# Patient Record
Sex: Female | Born: 1990 | Race: Black or African American | Hispanic: No | Marital: Single | Smoking: Never smoker
Health system: Southern US, Community
[De-identification: ages and names within clinical notes are randomized; demographics above are authoritative.]

## PROBLEM LIST (undated history)

## (undated) DIAGNOSIS — I499 Cardiac arrhythmia, unspecified: Secondary | ICD-10-CM

## (undated) DIAGNOSIS — F419 Anxiety disorder, unspecified: Secondary | ICD-10-CM

## (undated) DIAGNOSIS — F32A Depression, unspecified: Secondary | ICD-10-CM

## (undated) DIAGNOSIS — R002 Palpitations: Secondary | ICD-10-CM

## (undated) DIAGNOSIS — R011 Cardiac murmur, unspecified: Secondary | ICD-10-CM

## (undated) HISTORY — DX: Palpitations: R00.2

## (undated) HISTORY — PX: WRIST FRACTURE SURGERY: SHX121

---

## 2013-04-17 HISTORY — PX: FOOT FRACTURE SURGERY: SHX645

## 2015-09-25 ENCOUNTER — Encounter (HOSPITAL_COMMUNITY): Payer: Self-pay

## 2015-09-25 ENCOUNTER — Emergency Department (HOSPITAL_COMMUNITY)
Admission: EM | Admit: 2015-09-25 | Discharge: 2015-09-26 | Disposition: A | Discharge status: Home / Self Care | Payer: Self-pay | Attending: Emergency Medicine | Admitting: Emergency Medicine

## 2015-09-25 DIAGNOSIS — Y9389 Activity, other specified: Secondary | ICD-10-CM | POA: Insufficient documentation

## 2015-09-25 DIAGNOSIS — W228XXA Striking against or struck by other objects, initial encounter: Secondary | ICD-10-CM | POA: Insufficient documentation

## 2015-09-25 DIAGNOSIS — Y990 Civilian activity done for income or pay: Secondary | ICD-10-CM | POA: Insufficient documentation

## 2015-09-25 DIAGNOSIS — S0990XA Unspecified injury of head, initial encounter: Secondary | ICD-10-CM | POA: Insufficient documentation

## 2015-09-25 DIAGNOSIS — Y929 Unspecified place or not applicable: Secondary | ICD-10-CM | POA: Insufficient documentation

## 2015-09-25 NOTE — ED Provider Notes (Signed)
History  By signing my name below, I, Earmon Phoenix, attest that this documentation has been prepared under the direction and in the presence of Melburn Hake, [place]. Electronically Signed: Earmon Phoenix, ED Scribe. 09/25/2015. 12:06 AM.  Chief Complaint  Patient presents with  . Head Injury   The history is provided by the patient and medical records. No language interpreter was used.    HPI Comments:  Brenda Mcmahon is a 25 y.o. female who presents to the Emergency Department complaining of a head injury that occurred four days ago. She states she bent over and was startled, causing her to jump up and hit her forehead on a tile/laminate counter. She reports associated dizziness at the time of the injury. She reports some associated intermittent blurred vision, constant, worsening, right-sided HA, intermittent dizziness, nausea and light-headedness. She states she ran into a wall twice this evening due to the dizziness and HA. She states her dizziness and HA gradual worsened over the past few days. Pt reports taking Excedrin after the incident but has not taken anything since for the pain. Light and moving too fast increases her head pain. She denies alleviating factors. She denies LOC, abdominal pain, vomiting, numbness, tingling or weakness of any extremity, CP, SOB, neck stiffness. She denies any new injury or trauma to the head.    History reviewed. No pertinent past medical history. History reviewed. No pertinent past surgical history. No family history on file. Social History  Substance Use Topics  . Smoking status: Never Smoker   . Smokeless tobacco: None  . Alcohol Use: No   OB History    No data available     Review of Systems  Eyes: Positive for photophobia and visual disturbance.  Respiratory: Negative for shortness of breath.   Cardiovascular: Negative for chest pain.  Gastrointestinal: Positive for nausea. Negative for vomiting and abdominal pain.  Skin:  Negative for color change and wound.  Neurological: Positive for dizziness, light-headedness and headaches. Negative for syncope, weakness and numbness.    Allergies  Review of patient's allergies indicates not on file.  Home Medications   Prior to Admission medications   Not on File   Triage Vitals: BP 118/54 mmHg  Pulse 78  Temp(Src) 98.2 F (36.8 C) (Oral)  Resp 12  Ht  (1.575 m)  Wt 130 lb (58.968 kg)  BMI 23.77 kg/m2  SpO2 100%  LMP 09/25/2015 Physical Exam  Constitutional: She is oriented to person, place, and time. She appears well-developed and well-nourished. No distress.  HENT:  Head: Normocephalic and atraumatic. Head is without raccoon's eyes, without Battle's sign, without abrasion, without contusion and without laceration.  Right Ear: Tympanic membrane normal. No hemotympanum.  Left Ear: Tympanic membrane normal. No hemotympanum.  Nose: Nose normal. No sinus tenderness, nasal deformity, septal deviation or nasal septal hematoma. No epistaxis. Right sinus exhibits no maxillary sinus tenderness and no frontal sinus tenderness. Left sinus exhibits no maxillary sinus tenderness and no frontal sinus tenderness.  Mouth/Throat: Uvula is midline, oropharynx is clear and moist and mucous membranes are normal. No oropharyngeal exudate, posterior oropharyngeal edema, posterior oropharyngeal erythema or tonsillar abscesses.  Eyes: Conjunctivae and EOM are normal. Pupils are equal, round, and reactive to light. Right eye exhibits no discharge. Left eye exhibits no discharge. No scleral icterus.  Neck: Normal range of motion. Neck supple.  Cardiovascular: Normal rate, regular rhythm, normal heart sounds and intact distal pulses.   Pulmonary/Chest: Effort normal and breath sounds normal. No respiratory distress.  She has no wheezes. She has no rales. She exhibits no tenderness.  Abdominal: Soft. Bowel sounds are normal. She exhibits no distension and no mass. There is no  tenderness. There is no rebound and no guarding.  Musculoskeletal: Normal range of motion. She exhibits no edema.  No cervical, thoracic, or lumbar spine midline TTP.  Full ROM of bilateral upper and lower extremities with 5/5 strength.   2+ radial and PT pulses. Sensation grossly intact.   Lymphadenopathy:    She has no cervical adenopathy.  Neurological: She is alert and oriented to person, place, and time. She has normal strength. No cranial nerve deficit or sensory deficit. She displays a negative Romberg sign. Coordination and gait normal.  Pt able to stand and ambulate but endorses dizziness, no ataxia noted.  Skin: Skin is warm and dry. She is not diaphoretic.  Nursing note and vitals reviewed.   ED Course  Procedures (including critical care time) DIAGNOSTIC STUDIES: Oxygen Saturation is 100% on RA, normal by my interpretation.   COORDINATION OF CARE: 12:04 AM- Will CT head. Will order medication for nausea and HA. Pt verbalizes understanding and agrees to plan.  Medications  ondansetron (ZOFRAN-ODT) disintegrating tablet 8 mg (8 mg Oral Given 09/26/15 0025)  acetaminophen (TYLENOL) tablet 1,000 mg (1,000 mg Oral Given 09/26/15 0025)    Labs Review Labs Reviewed - No data to display  Imaging Review Ct Head Wo Contrast  09/26/2015  CLINICAL DATA:  Struck forehead on corner of counter at work. RIGHT blurry vision and headache. EXAM: CT HEAD WITHOUT CONTRAST TECHNIQUE: Contiguous axial images were obtained from the base of the skull through the vertex without intravenous contrast. COMPARISON:  None. FINDINGS: INTRACRANIAL CONTENTS: The ventricles and sulci are normal. No intraparenchymal hemorrhage, mass effect nor midline shift. No acute large vascular territory infarcts. No abnormal extra-axial fluid collections. Basal cisterns are patent. ORBITS: The included ocular globes and orbital contents are normal. SINUSES: The sphenoid air-fluid level and frothy secretions. SKULL/SOFT  TISSUES: No skull fracture. No significant soft tissue swelling. IMPRESSION: Normal CT HEAD. Less sphenoid sinusitis. Electronically Signed   By: Awilda Metroourtnay  Bloomer M.D.   On: 09/26/2015 01:43   I have personally reviewed and evaluated these images and lab results as part of my medical decision-making.   EKG Interpretation None      MDM   Final diagnoses:  Head injury, initial encounter   Patient presents with headache, dizziness, intermittent nausea and photophobia after hitting her head on a countertop 4 days ago. She reports running into the wall twice this evening due to dizziness. Denies LOC. VSS. Exam unremarkable. No neuro deficits. No signs of head injury or trauma. Due to patient having multiple head injuries with reported worsening headache and dizziness, will order head CT for further evaluation. Patient given pain meds and Zofran in the ED.  On reevaluation patient is resting comfortably in the room. CT head negative. I suspect patient's symptoms are likely due to concussion associated with recent head injuries. Discussed results and plan for discharge with patient. Plan to discharge patient home with symptomatic treatment and close PCP follow-up. Discussed return precautions with patient.  I personally performed the services described in this documentation, which was scribed in my presence. The recorded information has been reviewed and is accurate.    Satira Sarkicole Elizabeth Grove CityNadeau, [place]-C 09/26/15 0157  Dione Boozeavid Glick, MD 09/26/15 [phone]

## 2015-09-25 NOTE — ED Notes (Signed)
Pt reports she hit her head on a drain/counter at work 4 days ago. No LOC. She is here today for headache and nausea.

## 2015-09-26 ENCOUNTER — Emergency Department (HOSPITAL_COMMUNITY): Payer: Self-pay

## 2015-09-26 MED ORDER — ONDANSETRON 4 MG PO TBDP
8.0000 mg | ORAL_TABLET | Freq: Once | ORAL | Status: AC
  Administered 2015-09-26: 8 mg via ORAL
  Filled 2015-09-26: qty 2

## 2015-09-26 MED ORDER — ACETAMINOPHEN 500 MG PO TABS
1000.0000 mg | ORAL_TABLET | Freq: Once | ORAL | Status: AC
  Administered 2015-09-26: 1000 mg via ORAL
  Filled 2015-09-26: qty 2

## 2015-09-26 NOTE — Discharge Instructions (Signed)
I recommend alternating between Tylenol and ibuprofen so that you're taking a dose of medication every 3 hours as needed for pain relief. Refrain from drinking alcohol until your symptoms have completely resolved. Follow-up with your primary care provider if your symptoms have not improved over the next 24-48 hours. Please return to the Emergency Department if symptoms worsen or new onset of fever, neck stiffness, visual changes, abdominal pain, vomiting, urinary symptoms, numbness, tingling, weakness, seizures, syncope.

## 2015-10-02 ENCOUNTER — Encounter (HOSPITAL_COMMUNITY): Payer: Self-pay

## 2015-10-02 ENCOUNTER — Emergency Department (HOSPITAL_COMMUNITY)
Admission: EM | Admit: 2015-10-02 | Discharge: 2015-10-03 | Disposition: A | Discharge status: Home / Self Care | Payer: Self-pay | Attending: Emergency Medicine | Admitting: Emergency Medicine

## 2015-10-02 DIAGNOSIS — Y929 Unspecified place or not applicable: Secondary | ICD-10-CM | POA: Insufficient documentation

## 2015-10-02 DIAGNOSIS — W228XXA Striking against or struck by other objects, initial encounter: Secondary | ICD-10-CM | POA: Insufficient documentation

## 2015-10-02 DIAGNOSIS — Y939 Activity, unspecified: Secondary | ICD-10-CM | POA: Insufficient documentation

## 2015-10-02 DIAGNOSIS — F0781 Postconcussional syndrome: Secondary | ICD-10-CM

## 2015-10-02 DIAGNOSIS — G44309 Post-traumatic headache, unspecified, not intractable: Secondary | ICD-10-CM | POA: Insufficient documentation

## 2015-10-02 DIAGNOSIS — Y999 Unspecified external cause status: Secondary | ICD-10-CM | POA: Insufficient documentation

## 2015-10-02 MED ORDER — ONDANSETRON 4 MG PO TBDP
4.0000 mg | ORAL_TABLET | Freq: Once | ORAL | Status: AC
  Administered 2015-10-02: 4 mg via ORAL
  Filled 2015-10-02: qty 1

## 2015-10-02 MED ORDER — ONDANSETRON 4 MG PO TBDP: 4.0000 mg | ORAL_TABLET | Freq: Three times a day (TID) | ORAL | Status: DC | PRN

## 2015-10-02 NOTE — ED Provider Notes (Signed)
CSN: [id number]     Arrival date & time 10/02/15  1847 History   First MD Initiated Contact with Patient 10/02/15 2308     Chief Complaint  Patient presents with  . Headache   HPI Brenda Mcmahon is a 25 y.o. female presenting with headache. She hit her right front head on a tile about 11 days ago. She went to the ED 4 days later because of persistent headache, had a CT head which was normal, and discharged with ibuprofen/tylenol. The headache has continued despite the use of the medicines. It is located primarily on the right side, she has some associated right eye pain particularly when looking up, and some right eye blurriness. It is worse with movements, particularly standing up, and improves with laying down or staying still. She also has some associated intermittent numbness of the right cheek which is not present right now. She does not feel any focal weakness, she does have persistent dizziness. She has tried to go back to work but was sent home and mom forced her to come back to the ED to be evaluated.    (Consider location/radiation/quality/duration/timing/severity/associated sxs/prior Treatment) Patient is a 25 y.o. female presenting with headaches. The history is provided by the patient.  Headache Pain location:  Frontal Quality:  Dull Radiates to:  Does not radiate Duration:  10 days Timing:  Constant Progression:  Unchanged Chronicity:  New Similar to prior headaches: yes   Context: activity, bright light and loud noise   Context: not eating   Relieved by:  Resting in a darkened room Worsened by:  Activity, light and sound Ineffective treatments:  Acetaminophen and NSAIDs Associated symptoms: eye pain, fatigue, nausea, numbness, photophobia and visual change   Associated symptoms: no abdominal pain, no diarrhea, no ear pain, no fever, no vomiting and no weakness     History reviewed. No pertinent past medical history. History reviewed. No pertinent past surgical  history. No family history on file. Social History  Substance Use Topics  . Smoking status: Never Smoker   . Smokeless tobacco: None  . Alcohol Use: No   OB History    No data available     Review of Systems  Constitutional: Positive for fatigue. Negative for fever.  HENT: Negative for ear discharge, ear pain and trouble swallowing.   Eyes: Positive for photophobia, pain and visual disturbance.  Gastrointestinal: Positive for nausea. Negative for vomiting, abdominal pain and diarrhea.  Skin: Negative for rash.  Neurological: Positive for numbness and headaches. Negative for syncope and weakness.  All other systems reviewed and are negative.     Allergies  Review of patient's allergies indicates no known allergies.  Home Medications   Prior to Admission medications   Not on File   BP 123/44 mmHg  Pulse 65  Temp(Src) 98 F (36.7 C) (Oral)  Resp 12  Wt 58.968 kg  SpO2 100%  LMP 09/25/2015 Physical Exam  Constitutional: She is oriented to person, place, and time. She appears well-developed and well-nourished.  HENT:  Head: Normocephalic and atraumatic.  Eyes: Conjunctivae and EOM are normal. Pupils are equal, round, and reactive to light.  Neck: Normal range of motion. Neck supple.  Cardiovascular: Normal rate, regular rhythm and normal heart sounds.  Exam reveals no gallop and no friction rub.   No murmur heard. Pulmonary/Chest: Effort normal and breath sounds normal. No respiratory distress.  Musculoskeletal: Normal range of motion.  Neurological: She is alert and oriented to person, place, and time. She  has normal strength. No cranial nerve deficit. She exhibits normal muscle tone. Coordination normal.  Finger to nose testing normal bilaterally  Skin: Skin is warm.  Psychiatric: She has a normal mood and affect. Thought content normal.  Nursing note and vitals reviewed.   ED Course  Procedures (including critical care time) Labs Review Labs Reviewed - No  data to display  Imaging Review No results found. I have personally reviewed and evaluated these images and lab results as part of my medical decision-making.   EKG Interpretation None      MDM   Final diagnoses:  Post concussion syndrome   Normal neuro exam. No red flags. Likely experiencing post concussive headache. She had a normal CT head 1 week ago. Discussed time course for post-concussion syndrome can be prolonged, try to avoid electronics and take frequent breaks from activity, get regular amount of sleep, drink plenty of fluids. Will rx zofran to help with nausea. Return precautions given.    Nani RavensAndrew M Kraig Genis, MD 10/02/15 [id number]  Blane OharaJoshua Zavitz, MD 10/03/15 [phone]

## 2015-10-02 NOTE — ED Notes (Signed)
Patient complains of ongoing headache for the past week. Hit her head last week and seen for same and had CT. Here for re-evaluation for further headache. States pain worse when she bends over.

## 2015-10-02 NOTE — Discharge Instructions (Signed)
Post-Concussion Syndrome  Post-concussion syndrome describes the symptoms that can occur after a head injury. These symptoms can last from weeks to months.  CAUSES   It is not clear why some head injuries cause post-concussion syndrome. It can occur whether your head injury was mild or severe and whether you were wearing head protection or not.   SIGNS AND SYMPTOMS  · Memory difficulties.  · Dizziness.  · Headaches.  · Double vision or blurry vision.  · Sensitivity to light.  · Hearing difficulties.  · Depression.  · Tiredness.  · Weakness.  · Difficulty with concentration.  · Difficulty sleeping or staying asleep.  · Vomiting.  · Poor balance or instability on your feet.  · Slow reaction time.  · Difficulty learning and remembering things you have heard.  DIAGNOSIS   There is no test to determine whether you have post-concussion syndrome. Your health care provider may order an imaging scan of your brain, such as a CT scan, to check for other problems that may be causing your symptoms (such as a severe injury inside your skull).  TREATMENT   Usually, these problems disappear over time without medical care. Your health care provider may prescribe medicine to help ease your symptoms. It is important to follow up with a neurologist to evaluate your recovery and address any lingering symptoms or issues.  HOME CARE INSTRUCTIONS   · Take medicines only as directed by your health care provider. Do not take aspirin. Aspirin can slow blood clotting.  · Sleep with your head slightly elevated to help with headaches.  · Avoid any situation where there is potential for another head injury. This includes football, hockey, soccer, basketball, martial arts, downhill snow sports, and horseback riding. Your condition will get worse every time you experience a concussion. You should avoid these activities until you are evaluated by the appropriate follow-up health care providers.  · Keep all follow-up visits as directed by your health  care provider. This is important.  SEEK MEDICAL CARE IF:  · You have increased problems paying attention or concentrating.  · You have increased difficulty remembering or learning new information.  · You need more time to complete tasks or assignments than before.  · You have increased irritability or decreased ability to cope with stress.  · You have more symptoms than before.  Seek medical care if you have any of the following symptoms for more than two weeks after your injury:  · Lasting (chronic) headaches.  · Dizziness or balance problems.  · Nausea.  · Vision problems.  · Increased sensitivity to noise or light.  · Depression or mood swings.  · Anxiety or irritability.  · Memory problems.  · Difficulty concentrating or paying attention.  · Sleep problems.  · Feeling tired all the time.  SEEK IMMEDIATE MEDICAL CARE IF:  · You have confusion or unusual drowsiness.  · Others find it difficult to wake you up.  · You have nausea or persistent, forceful vomiting.  · You feel like you are moving when you are not (vertigo). Your eyes may move rapidly back and forth.  · You have convulsions or faint.  · You have severe, persistent headaches that are not relieved by medicine.  · You cannot use your arms or legs normally.  · One of your pupils is larger than the other.  · You have clear or bloody discharge from your nose or ears.  · Your problems are getting worse, not better.  MAKE   SURE YOU:  · Understand these instructions.  · Will watch your condition.  · Will get help right away if you are not doing well or get worse.     This information is not intended to replace advice given to you by your health care provider. Make sure you discuss any questions you have with your health care provider.     Document Released: 09/23/2001 Document Revised: 04/24/2014 Document Reviewed: 07/09/2013  [employer] Interactive Patient Education ©2016 [employer]

## 2016-08-07 ENCOUNTER — Emergency Department (HOSPITAL_COMMUNITY): Payer: No Typology Code available for payment source

## 2016-08-07 ENCOUNTER — Emergency Department (HOSPITAL_COMMUNITY)
Admission: EM | Admit: 2016-08-07 | Discharge: 2016-08-07 | Disposition: A | Discharge status: Home / Self Care | Payer: No Typology Code available for payment source | Attending: Emergency Medicine | Admitting: Emergency Medicine

## 2016-08-07 ENCOUNTER — Encounter (HOSPITAL_COMMUNITY): Payer: Self-pay | Admitting: *Deleted

## 2016-08-07 DIAGNOSIS — M542 Cervicalgia: Secondary | ICD-10-CM | POA: Insufficient documentation

## 2016-08-07 DIAGNOSIS — R079 Chest pain, unspecified: Secondary | ICD-10-CM | POA: Insufficient documentation

## 2016-08-07 DIAGNOSIS — Y92410 Unspecified street and highway as the place of occurrence of the external cause: Secondary | ICD-10-CM | POA: Diagnosis not present

## 2016-08-07 DIAGNOSIS — Y999 Unspecified external cause status: Secondary | ICD-10-CM | POA: Insufficient documentation

## 2016-08-07 DIAGNOSIS — S99911A Unspecified injury of right ankle, initial encounter: Secondary | ICD-10-CM | POA: Diagnosis present

## 2016-08-07 DIAGNOSIS — M25571 Pain in right ankle and joints of right foot: Secondary | ICD-10-CM | POA: Diagnosis not present

## 2016-08-07 DIAGNOSIS — Y939 Activity, unspecified: Secondary | ICD-10-CM | POA: Insufficient documentation

## 2016-08-07 DIAGNOSIS — Z79899 Other long term (current) drug therapy: Secondary | ICD-10-CM | POA: Insufficient documentation

## 2016-08-07 DIAGNOSIS — R51 Headache: Secondary | ICD-10-CM | POA: Insufficient documentation
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
## 2017-02-07 ENCOUNTER — Other Ambulatory Visit: Payer: Self-pay | Admitting: Anesthesiology

## 2017-02-08 ENCOUNTER — Other Ambulatory Visit: Payer: Self-pay | Admitting: Anesthesiology

## 2017-02-08 DIAGNOSIS — M25571 Pain in right ankle and joints of right foot: Secondary | ICD-10-CM

## 2017-02-26 ENCOUNTER — Ambulatory Visit
Admission: RE | Admit: 2017-02-26 | Discharge: 2017-02-26 | Disposition: A | Discharge status: Home / Self Care | Payer: Self-pay | Source: Ambulatory Visit | Attending: Anesthesiology | Admitting: Anesthesiology

## 2017-02-26 DIAGNOSIS — M25571 Pain in right ankle and joints of right foot: Secondary | ICD-10-CM

## 2017-02-26 MED ORDER — GADOBENATE DIMEGLUMINE 529 MG/ML IV SOLN
12.0000 mL | Freq: Once | INTRAVENOUS | Status: AC | PRN
  Administered 2017-02-26: 12 mL via INTRAVENOUS

## 2017-03-14 DIAGNOSIS — F332 Major depressive disorder, recurrent severe without psychotic features: Secondary | ICD-10-CM

## 2017-03-14 DIAGNOSIS — F321 Major depressive disorder, single episode, moderate: Secondary | ICD-10-CM | POA: Insufficient documentation

## 2017-03-14 DIAGNOSIS — M7661 Achilles tendinitis, right leg: Secondary | ICD-10-CM | POA: Insufficient documentation

## 2017-03-14 HISTORY — DX: Achilles tendinitis, right leg: M76.61

## 2017-03-14 HISTORY — DX: Major depressive disorder, recurrent severe without psychotic features: F33.2

## 2019-10-02 ENCOUNTER — Other Ambulatory Visit (HOSPITAL_COMMUNITY): Payer: Self-pay | Admitting: Family Medicine

## 2019-10-02 ENCOUNTER — Other Ambulatory Visit: Payer: Self-pay

## 2019-10-02 ENCOUNTER — Ambulatory Visit (HOSPITAL_COMMUNITY)
Admission: RE | Admit: 2019-10-02 | Discharge: 2019-10-02 | Disposition: A | Discharge status: Home / Self Care | Payer: PRIVATE HEALTH INSURANCE | Source: Ambulatory Visit | Attending: Family Medicine | Admitting: Family Medicine

## 2019-10-02 DIAGNOSIS — S6992XA Unspecified injury of left wrist, hand and finger(s), initial encounter: Secondary | ICD-10-CM

## 2019-10-29 MED FILL — GABAPENTIN 300 MG CAPSULE: 300 | 30 days supply | Qty: 90 | Fill #0

## 2019-10-29 MED FILL — predniSONE 5 MG TABS: 5 | 6 days supply | Qty: 21 | Fill #0

## 2019-10-29 MED FILL — MELOXICAM 15 MG TABLET: 15 | 30 days supply | Qty: 30 | Fill #0

## 2020-01-13 NOTE — Progress Notes (Signed)
Acute Office Visit  Subjective:    Patient ID: Brenda Mcmahon, female    DOB: [birth year], 29 y.o.   MRN: [id number]  Chief Complaint  Patient presents with  . Abdominal Pain  . Depression    Look at [facility]    HPI Patient is in today for abdominal pain that started 1.5 weeks ago, started after eating some mozzorella sticks around lunch time and then ate a cheeseburger that evening. Did not eat anything Sunday or Monday. Patient feels that maybe her stomach pain was due to the dairy however she has not ever had this issue in the past. Hurts epigastric abdominal pain. Worsened by dairy products. Tried a lactate tablet with meal with sour cream which seemed to help some. No constiaption. No diarrhea. No voiting.  LMP: very irregular and sparse because she is on depo.  Patient has had problems with depression in the past.  Previously she has been on Paxil and Zoloft.  She never took them long enough to see if they would actually help.  Patient does report significant depression and anxiety currently.  Denies suicidal ideation.  Patient does have anhedonia.  She has some difficulty sleeping. Her PHQ-9 score is a 13.  History reviewed. No pertinent past medical history.  Past Surgical History:  Procedure Laterality Date  . FOOT FRACTURE SURGERY Right 2015   also ankle fracture repaired.   . WRIST FRACTURE SURGERY Left    29 yo.  fell through glass door.     Family History  Problem Relation Age of Onset  . Seizures Mother   . Hypotension Mother   . Asthma Brother     Social History   Socioeconomic History  . Marital status: Single    Spouse name: Not on file  . Number of children: Not on file  . Years of education: Not on file  . Highest education level: Not on file  Occupational History  . Occupation: Education administrator    Employer: [employer]  Tobacco Use  . Smoking status: Never Smoker  . Smokeless tobacco: Never Used  Substance and Sexual Activity  . Alcohol use:  No  . Drug use: Never  . Sexual activity: Not on file  Other Topics Concern  . Not on file  Social History Narrative  . Not on file   Social Determinants of Health   Financial Resource Strain:   . Difficulty of Paying Living Expenses: Not on file  Food Insecurity:   . Worried About Charity fundraiser in the Last Year: Not on file  . Ran Out of Food in the Last Year: Not on file  Transportation Needs:   . Lack of Transportation (Medical): Not on file  . Lack of Transportation (Non-Medical): Not on file  Physical Activity:   . Days of Exercise per Week: Not on file  . Minutes of Exercise per Session: Not on file  Stress:   . Feeling of Stress : Not on file  Social Connections:   . Frequency of Communication with Friends and Family: Not on file  . Frequency of Social Gatherings with Friends and Family: Not on file  . Attends Religious Services: Not on file  . Active Member of Clubs or Organizations: Not on file  . Attends Archivist Meetings: Not on file  . Marital Status: Not on file  Intimate Partner Violence:   . Fear of Current or Ex-Partner: Not on file  . Emotionally Abused: Not on file  .  Physically Abused: Not on file  . Sexually Abused: Not on file    Outpatient Medications Prior to Visit  Medication Sig Dispense Refill  . medroxyPROGESTERone (DEPO-PROVERA) 150 MG/ML injection Inject into the muscle.    . ondansetron (ZOFRAN ODT) 4 MG disintegrating tablet Take 1 tablet (4 mg total) by mouth every 8 (eight) hours as needed for nausea or vomiting. 20 tablet 0   No facility-administered medications prior to visit.    Allergies  Allergen Reactions  . Hydrocodone   . Oxycodone     Review of Systems  Constitutional: Negative for chills, fatigue and fever.  HENT: Negative for congestion, ear pain, rhinorrhea and sore throat.   Respiratory: Negative for cough and shortness of breath.   Cardiovascular: Negative for chest pain.  Gastrointestinal:  Positive for abdominal pain, diarrhea (self induced- took stool softeners) and nausea. Negative for constipation and vomiting.  Genitourinary: Negative for dysuria and urgency.       On Depo but has some spotting for 2 days and then a heavy flow for 1 day.  Musculoskeletal: Negative for back pain and myalgias.  Neurological: Negative for dizziness, weakness, light-headedness and headaches.  Psychiatric/Behavioral: Negative for dysphoric mood. The patient is not nervous/anxious.        Objective:    Physical Exam Vitals reviewed.  Constitutional:      Appearance: Normal appearance. She is normal weight.  HENT:     Right Ear: Tympanic membrane, ear canal and external ear normal.     Left Ear: Tympanic membrane and external ear normal.     Nose: Nose normal.     Mouth/Throat:     Pharynx: Oropharynx is clear.  Neck:     Vascular: No carotid bruit.  Cardiovascular:     Rate and Rhythm: Normal rate and regular rhythm.     Pulses: Normal pulses.     Heart sounds: Normal heart sounds. No murmur heard.   Pulmonary:     Effort: Pulmonary effort is normal. No respiratory distress.     Breath sounds: Normal breath sounds.  Abdominal:     General: Abdomen is flat. Bowel sounds are normal.     Palpations: Abdomen is soft.     Tenderness: There is abdominal tenderness in the epigastric area. There is no right CVA tenderness, left CVA tenderness, guarding or rebound. Negative signs include Murphy's sign and McBurney's sign.  Neurological:     Mental Status: She is alert and oriented to person, place, and time.  Psychiatric:        Mood and Affect: Mood normal.        Behavior: Behavior normal.     BP 122/70   Pulse 84   Temp 97.7 F (36.5 C)   Ht $R'5\' 2"'BW$  (1.575 m)   Wt 127 lb (57.6 kg)   SpO2 100%   BMI 23.23 kg/m  Wt Readings from Last 3 Encounters:  01/14/20 127 lb (57.6 kg)  10/02/15 130 lb (59 kg)  09/25/15 130 lb (59 kg)    Health Maintenance Due  Topic Date Due  .  Hepatitis C Screening  Never done  . HIV Screening  Never done  . TETANUS/TDAP  Never done  . PAP-Cervical Cytology Screening  Never done  . PAP SMEAR-Modifier  Never done  . INFLUENZA VACCINE  Never done    There are no preventive care reminders to display for this patient.   No results found for: TSH No results found for: WBC, HGB, HCT, MCV,  PLT No results found for: NA, K, CHLORIDE, CO2, GLUCOSE, BUN, CREATININE, BILITOT, ALKPHOS, AST, ALT, PROT, ALBUMIN, CALCIUM, ANIONGAP, EGFR, GFR No results found for: CHOL No results found for: HDL No results found for: LDLCALC No results found for: TRIG No results found for: CHOLHDL No results found for: HGBA1C     Assessment & Plan:  1. Epigastric pain Recommend patient avoid lactose-containing products for the next 4 to 6 weeks.  I then slowly reintroduce and see if symptoms recur. - CBC with Differential/Platelet - Comprehensive metabolic panel - Helicobacter pylori abs-IgG+IgA, bld  2. Moderate recurrent major depression (HCC) - citalopram (CELEXA) 20 MG tablet; Take 1 tablet (20 mg total) by mouth daily.  Dispense: 30 tablet; Refill: 3 - TSH    Meds ordered this encounter  Medications  . citalopram (CELEXA) 20 MG tablet    Sig: Take 1 tablet (20 mg total) by mouth daily.    Dispense:  30 tablet    Refill:  3    Orders Placed This Encounter  Procedures  . CBC with Differential/Platelet  . Comprehensive metabolic panel  . TSH  . Helicobacter pylori abs-IgG+IgA, bld     Follow-up: Return in about 4 weeks (around 02/11/2020) for depression.  An After Visit Summary was printed and given to the patient.  Rochel Brome [facility] [phone]

## 2020-01-14 ENCOUNTER — Other Ambulatory Visit: Payer: Self-pay

## 2020-01-14 ENCOUNTER — Encounter: Payer: Self-pay | Admitting: Family Medicine

## 2020-01-14 ENCOUNTER — Ambulatory Visit (INDEPENDENT_AMBULATORY_CARE_PROVIDER_SITE_OTHER): Payer: No Typology Code available for payment source | Admitting: Family Medicine

## 2020-01-14 VITALS — BP 122/70 | HR 84 | Temp 97.7°F | Ht 62.0 in | Wt 127.0 lb

## 2020-01-14 DIAGNOSIS — R1013 Epigastric pain: Secondary | ICD-10-CM

## 2020-01-14 DIAGNOSIS — F331 Major depressive disorder, recurrent, moderate: Secondary | ICD-10-CM

## 2020-01-14 DIAGNOSIS — Z23 Encounter for immunization: Secondary | ICD-10-CM

## 2020-01-14 MED ORDER — CITALOPRAM HYDROBROMIDE 20 MG PO TABS: 20.0000 mg | ORAL_TABLET | Freq: Every day | ORAL | 3 refills | Status: DC

## 2020-01-14 MED FILL — CITALOPRAM HBR 20 MG TABLET: 20 | 30 days supply | Qty: 30 | Fill #0

## 2020-01-14 NOTE — Patient Instructions (Addendum)
Start on citalopram 20 mg once daily.  Major Depressive Disorder, Adult Major depressive disorder (MDD) is a mental health condition. MDD often makes you feel sad, hopeless, or helpless. MDD can also cause symptoms in your body. MDD can affect your:  Work.  School.  Relationships.  Other normal activities. MDD can range from mild to very bad. It may occur once (single episode MDD). It can also occur many times (recurrent MDD). The main symptoms of MDD often include:  Feeling sad, depressed, or irritable most of the time.  Loss of interest. MDD symptoms also include:  Sleeping too much or too little.  Eating too much or too little.  A change in your weight.  Feeling tired (fatigue) or having low energy.  Feeling worthless.  Feeling guilty.  Trouble making decisions.  Trouble thinking clearly.  Thoughts of suicide or harming others.  Feeling weak.  Feeling agitated.  Keeping yourself from being around other people (isolation). Follow these instructions at home: Activity  Do these things as told by your doctor: ? Go back to your normal activities. ? Exercise regularly. ? Spend time outdoors. Alcohol  Talk with your doctor about how alcohol can affect your antidepressant medicines.  Do not drink alcohol. Or, limit how much alcohol you drink. ? This means no more than 1 drink a day for nonpregnant women and 2 drinks a day for men. One drink equals one of these:  12 oz of beer.  5 oz of wine.  1 oz of hard liquor. General instructions  Take over-the-counter and prescription medicines only as told by your doctor.  Eat a healthy diet.  Get plenty of sleep.  Find activities that you enjoy. Make time to do them.  Think about joining a support group. Your doctor may be able to suggest a group for you.  Keep all follow-up visits as told by your doctor. This is important. Where to find more information:  [employer] on Mental  Illness: ? www.nami.org  [place] [employer] of Mental Health: ? http://www.maynard.net/  National Suicide Prevention Lifeline: ? 707-286-1274. This is free, 24-hour help. Contact a doctor if:  Your symptoms get worse.  You have new symptoms. Get help right away if:  You self-harm.  You see, hear, taste, smell, or feel things that are not present (hallucinate). If you ever feel like you may hurt yourself or others, or have thoughts about taking your own life, get help right away. You can go to your nearest emergency department or call:  Your local emergency services (911 in the [place]  A suicide crisis helpline, such as the National Suicide Prevention Lifeline: ? 707-005-6527. This is open 24 hours a day. This information is not intended to replace advice given to you by your health care provider. Make sure you discuss any questions you have with your health care provider. Document Revised: 03/16/2017 Document Reviewed: 12/19/2015 Elsevier Patient Education  2020 [employer] Inc[employer] Insomnia Insomnia is a sleep disorder that makes it difficult to fall asleep or stay asleep. Insomnia can cause fatigue, low energy, difficulty concentrating, mood swings, and poor performance at work or school. There are three different ways to classify insomnia:  Difficulty falling asleep.  Difficulty staying asleep.  Waking up too early in the morning. Any type of insomnia can be long-term (chronic) or short-term (acute). Both are common. Short-term insomnia usually lasts for three months or less. Chronic insomnia occurs at least three times a week for longer than three months. What are the  causes? Insomnia may be caused by another condition, situation, or substance, such as:  Anxiety.  Certain medicines.  Gastroesophageal reflux disease (GERD) or other gastrointestinal conditions.  Asthma or other breathing conditions.  Restless legs syndrome, sleep apnea, or other sleep  disorders.  Chronic pain.  Menopause.  Stroke.  Abuse of alcohol, tobacco, or illegal drugs.  Mental health conditions, such as depression.  Caffeine.  Neurological disorders, such as Alzheimer's disease.  An overactive thyroid (hyperthyroidism). Sometimes, the cause of insomnia may not be known. What increases the risk? Risk factors for insomnia include:  Gender. Women are affected more often than men.  Age. Insomnia is more common as you get older.  Stress.  Lack of exercise.  Irregular work schedule or working night shifts.  Traveling between different time zones.  Certain medical and mental health conditions. What are the signs or symptoms? If you have insomnia, the main symptom is having trouble falling asleep or having trouble staying asleep. This may lead to other symptoms, such as:  Feeling fatigued or having low energy.  Feeling nervous about going to sleep.  Not feeling rested in the morning.  Having trouble concentrating.  Feeling irritable, anxious, or depressed. How is this diagnosed? This condition may be diagnosed based on:  Your symptoms and medical history. Your health care provider may ask about: ? Your sleep habits. ? Any medical conditions you have. ? Your mental health.  A physical exam. How is this treated? Treatment for insomnia depends on the cause. Treatment may focus on treating an underlying condition that is causing insomnia. Treatment may also include:  Medicines to help you sleep.  Counseling or therapy.  Lifestyle adjustments to help you sleep better. Follow these instructions at home: Eating and drinking   Limit or avoid alcohol, caffeinated beverages, and cigarettes, especially close to bedtime. These can disrupt your sleep.  Do not eat a large meal or eat spicy foods right before bedtime. This can lead to digestive discomfort that can make it hard for you to sleep. Sleep habits   Keep a sleep diary to help you  and your health care provider figure out what could be causing your insomnia. Write down: ? When you sleep. ? When you wake up during the night. ? How well you sleep. ? How rested you feel the next day. ? Any side effects of medicines you are taking. ? What you eat and drink.  Make your bedroom a dark, comfortable place where it is easy to fall asleep. ? Put up shades or blackout curtains to block light from outside. ? Use a white noise machine to block noise. ? Keep the temperature cool.  Limit screen use before bedtime. This includes: ? Watching TV. ? Using your smartphone, tablet, or computer.  Stick to a routine that includes going to bed and waking up at the same times every day and night. This can help you fall asleep faster. Consider making a quiet activity, such as reading, part of your nighttime routine.  Try to avoid taking naps during the day so that you sleep better at night.  Get out of bed if you are still awake after 15 minutes of trying to sleep. Keep the lights down, but try reading or doing a quiet activity. When you feel sleepy, go back to bed. General instructions  Take over-the-counter and prescription medicines only as told by your health care provider.  Exercise regularly, as told by your health care provider. Avoid exercise starting several hours  before bedtime.  Use relaxation techniques to manage stress. Ask your health care provider to suggest some techniques that may work well for you. These may include: ? Breathing exercises. ? Routines to release muscle tension. ? Visualizing peaceful scenes.  Make sure that you drive carefully. Avoid driving if you feel very sleepy.  Keep all follow-up visits as told by your health care provider. This is important. Contact a health care provider if:  You are tired throughout the day.  You have trouble in your daily routine due to sleepiness.  You continue to have sleep problems, or your sleep problems get  worse. Get help right away if:  You have serious thoughts about hurting yourself or someone else. If you ever feel like you may hurt yourself or others, or have thoughts about taking your own life, get help right away. You can go to your nearest emergency department or call:  Your local emergency services (911 in the [place]  A suicide crisis helpline, such as the National Suicide Prevention Lifeline at (252)193-30071-301-631-1593. This is open 24 hours a day. Summary  Insomnia is a sleep disorder that makes it difficult to fall asleep or stay asleep.  Insomnia can be long-term (chronic) or short-term (acute).  Treatment for insomnia depends on the cause. Treatment may focus on treating an underlying condition that is causing insomnia.  Keep a sleep diary to help you and your health care provider figure out what could be causing your insomnia. This information is not intended to replace advice given to you by your health care provider. Make sure you discuss any questions you have with your health care provider. Document Revised: 03/16/2017 Document Reviewed: 01/11/2017 Elsevier Patient Education  2020 Elsevier Inc.  Lactose-Free Diet, Adult If you have lactose intolerance, you are not able to digest lactose. Lactose is a natural sugar found mainly in dairy milk and dairy products. You may need to avoid all foods and beverages that contain lactose. A lactose-free diet can help you do this. Which foods have lactose? Lactose is found in dairy milk and dairy products, such as:  Yogurt.  Cheese.  Butter.  Margarine.  Sour cream.  Cream.  Whipped toppings and nondairy creamers.  Ice cream and other dairy-based desserts. Lactose is also found in foods or products made with dairy milk or milk ingredients. To find out whether a food contains dairy milk or a milk ingredient, look at the ingredients list. Avoid foods with the statement "May contain milk" and foods that contain:  Milk  powder.  Whey.  Curd.  Caseinate.  Lactose.  Lactalbumin.  Lactoglobulin. What are alternatives to dairy milk and foods made with milk products?  Lactose-free milk.  Soy milk with added calcium and vitamin D.  Almond milk, coconut milk, rice milk, or other nondairy milk alternatives with added calcium and vitamin D. Note that these are low in protein.  Soy products, such as soy yogurt, soy cheese, soy ice cream, and soy-based sour cream.  Other nut milk products, such as almond yogurt, almond cheese, cashew yogurt, cashew cheese, cashew ice cream, coconut yogurt, and coconut ice cream. What are tips for following this plan?  Do not consume foods, beverages, vitamins, minerals, or medicines containing lactose. Read ingredient lists carefully.  Look for the words "lactose-free" on labels.  Use lactase enzyme drops or tablets as directed by your health care provider.  Use lactose-free milk or a milk alternative, such as soy milk or almond milk, for drinking and cooking.  Make sure you get enough calcium and vitamin D in your diet. A lactose-free eating plan can be lacking in these important nutrients.  Take calcium and vitamin D supplements as directed by your health care provider. Talk to your health care provider about supplements if you are not able to get enough calcium and vitamin D from food. What foods can I eat?  Fruits All fresh, canned, frozen, or dried fruits that are not processed with lactose. Vegetables All fresh, frozen, and canned vegetables without cheese, cream, or butter sauces. Grains Any that are not made with dairy milk or dairy products. Meats and other proteins Any meat, fish, poultry, and other protein sources that are not made with dairy milk or dairy products. Soy cheese and yogurt. Fats and oils Any that are not made with dairy milk or dairy products. Beverages Lactose-free milk. Soy, rice, or almond milk with added calcium and vitamin D.  Fruit and vegetable juices. Sweets and desserts Any that are not made with dairy milk or dairy products. Seasonings and condiments Any that are not made with dairy milk or dairy products. Calcium Calcium is found in many foods that contain lactose and is important for bone health. The amount of calcium you need depends on your age:  Adults younger than 50 years: 1,000 mg of calcium a day.  Adults older than 50 years: 1,200 mg of calcium a day. If you are not getting enough calcium, you may get it from other sources, including:  Orange juice with calcium added. There are 300-350 mg of calcium in 1 cup of orange juice.  Calcium-fortified soy milk. There are 300-400 mg of calcium in 1 cup of calcium-fortified soy milk.  Calcium-fortified rice or almond milk. There are 300 mg of calcium in 1 cup of calcium-fortified rice or almond milk.  Calcium-fortified breakfast cereals. There are 100-1,000 mg of calcium in calcium-fortified breakfast cereals.  Spinach, cooked. There are 145 mg of calcium in  cup of cooked spinach.  Edamame, cooked. There are 130 mg of calcium in  cup of cooked edamame.  Collard greens, cooked. There are 125 mg of calcium in  cup of cooked collard greens.  Kale, frozen or cooked. There are 90 mg of calcium in  cup of cooked or frozen kale.  Almonds. There are 95 mg of calcium in  cup of almonds.  Broccoli, cooked. There are 60 mg of calcium in 1 cup of cooked broccoli. The items listed above may not be a complete list of recommended foods and beverages. Contact a dietitian for more options. What foods are not recommended? Fruits None, unless they are made with dairy milk or dairy products. Vegetables None, unless they are made with dairy milk or dairy products. Grains Any grains that are made with dairy milk or dairy products. Meats and other proteins None, unless they are made with dairy milk or dairy products. Dairy All dairy products, including milk,  goat's milk, buttermilk, kefir, acidophilus milk, flavored milk, evaporated milk, condensed milk, dulce de [place]. [place], eggnog, yogurt, cheese, and cheese spreads. Fats and oils Any that are made with milk or milk products. Margarines and salad dressings that contain milk or cheese. Cream. Half and half. Cream cheese. Sour cream. Chip dips made with sour cream or yogurt. Beverages Hot chocolate. Cocoa with lactose. Instant iced teas. Powdered fruit drinks. Smoothies made with dairy milk or yogurt. Sweets and desserts Any that are made with milk or milk products. Seasonings and condiments Chewing gum that has  lactose. Spice blends if they contain lactose. Artificial sweeteners that contain lactose. Nondairy creamers. The items listed above may not be a complete list of foods and beverages to avoid. Contact a dietitian for more information. Summary  If you are lactose intolerant, it means that you have a hard time digesting lactose, a natural sugar found in milk and milk products.  Following a lactose-free diet can help you manage this condition.  Calcium is important for bone health and is found in many foods that contain lactose. Talk with your health care provider about other sources of calcium. This information is not intended to replace advice given to you by your health care provider. Make sure you discuss any questions you have with your health care provider. Document Revised: 05/01/2017 Document Reviewed: 05/01/2017 Elsevier Patient Education  2020 [employer].

## 2020-01-16 LAB — COMPREHENSIVE METABOLIC PANEL
ALT: 9 IU/L (ref 0–32)
AST: 16 IU/L (ref 0–40)
Albumin/Globulin Ratio: 1.7 (ref 1.2–2.2)
Albumin: 4.6 g/dL (ref 3.9–5.0)
Alkaline Phosphatase: 44 IU/L (ref 44–121)
BUN/Creatinine Ratio: 8 — ABNORMAL LOW (ref 9–23)
BUN: 7 mg/dL (ref 6–20)
Bilirubin Total: 0.3 mg/dL (ref 0.0–1.2)
CO2: 24 mmol/L (ref 20–29)
Calcium: 9.4 mg/dL (ref 8.7–10.2)
Chloride: 102 mmol/L (ref 96–106)
Creatinine, Ser: 0.86 mg/dL (ref 0.57–1.00)
GFR calc Af Amer: 106 mL/min/{1.73_m2} (ref >59)
GFR calc non Af Amer: 92 mL/min/{1.73_m2} (ref >59)
Globulin, Total: 2.7 g/dL (ref 1.5–4.5)
Glucose: 89 mg/dL (ref 65–99)
Potassium: 4.1 mmol/L (ref 3.5–5.2)
Sodium: 137 mmol/L (ref 134–144)
Total Protein: 7.3 g/dL (ref 6.0–8.5)

## 2020-01-16 LAB — HELICOBACTER PYLORI ABS-IGG+IGA, BLD
H. pylori, IgA Abs: <9 units (ref 0.0–8.9)
H. pylori, IgG AbS: 0.21 Index Value (ref 0.00–0.79)

## 2020-01-16 LAB — CBC WITH DIFFERENTIAL/PLATELET
Basophils Absolute: 0 10*3/uL (ref 0.0–0.2)
Basos: 1 %
EOS (ABSOLUTE): 0.3 10*3/uL (ref 0.0–0.4)
Eos: 7 %
Hematocrit: 37.3 % (ref 34.0–46.6)
Hemoglobin: 12.6 g/dL (ref 11.1–15.9)
Immature Grans (Abs): 0 10*3/uL (ref 0.0–0.1)
Immature Granulocytes: 0 %
Lymphocytes Absolute: 2.2 10*3/uL (ref 0.7–3.1)
Lymphs: 42 %
MCH: 27.9 pg (ref 26.6–33.0)
MCHC: 33.8 g/dL (ref 31.5–35.7)
MCV: 83 fL (ref 79–97)
Monocytes Absolute: 0.3 10*3/uL (ref 0.1–0.9)
Monocytes: 6 %
Neutrophils Absolute: 2.2 10*3/uL (ref 1.4–7.0)
Neutrophils: 44 %
Platelets: 185 10*3/uL (ref 150–450)
RBC: 4.51 x10E6/uL (ref 3.77–5.28)
RDW: 11.5 % — ABNORMAL LOW (ref 11.7–15.4)
WBC: 5.1 10*3/uL (ref 3.4–10.8)

## 2020-01-16 LAB — TSH: TSH: 1.24 u[IU]/mL (ref 0.450–4.500)

## 2020-02-11 ENCOUNTER — Ambulatory Visit: Payer: No Typology Code available for payment source | Admitting: Family Medicine

## 2020-04-09 ENCOUNTER — Ambulatory Visit (HOSPITAL_COMMUNITY)
Admission: EM | Admit: 2020-04-09 | Discharge: 2020-04-09 | Disposition: A | Discharge status: Home / Self Care | Payer: No Typology Code available for payment source | Attending: Urgent Care | Admitting: Urgent Care

## 2020-04-09 ENCOUNTER — Other Ambulatory Visit: Payer: Self-pay

## 2020-04-09 ENCOUNTER — Encounter (HOSPITAL_COMMUNITY): Payer: Self-pay

## 2020-04-09 DIAGNOSIS — S161XXA Strain of muscle, fascia and tendon at neck level, initial encounter: Secondary | ICD-10-CM

## 2020-04-09 DIAGNOSIS — M542 Cervicalgia: Secondary | ICD-10-CM

## 2020-04-09 DIAGNOSIS — M545 Low back pain, unspecified: Secondary | ICD-10-CM

## 2020-04-09 MED ORDER — TIZANIDINE HCL 4 MG PO TABS: 4.0000 mg | ORAL_TABLET | Freq: Three times a day (TID) | ORAL | 0 refills | Status: DC | PRN

## 2020-04-09 MED ORDER — NAPROXEN 375 MG PO TABS: 375.0000 mg | ORAL_TABLET | Freq: Two times a day (BID) | ORAL | 0 refills | Status: DC
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
## 2020-04-13 ENCOUNTER — Encounter: Payer: Self-pay | Admitting: Nurse Practitioner

## 2020-04-13 ENCOUNTER — Other Ambulatory Visit: Payer: Self-pay

## 2020-04-13 ENCOUNTER — Other Ambulatory Visit: Payer: Self-pay | Admitting: Nurse Practitioner

## 2020-04-13 ENCOUNTER — Ambulatory Visit (INDEPENDENT_AMBULATORY_CARE_PROVIDER_SITE_OTHER): Payer: No Typology Code available for payment source | Admitting: Nurse Practitioner

## 2020-04-13 DIAGNOSIS — M62838 Other muscle spasm: Secondary | ICD-10-CM | POA: Diagnosis not present

## 2020-04-13 LAB — POCT URINALYSIS DIPSTICK
Bilirubin, UA: NEGATIVE
Blood, UA: NEGATIVE
Glucose, UA: NEGATIVE
Ketones, UA: NEGATIVE
Leukocytes, UA: NEGATIVE
Nitrite, UA: NEGATIVE
Protein, UA: NEGATIVE
Spec Grav, UA: 1.02 (ref 1.010–1.025)
Urobilinogen, UA: NEGATIVE E.U./dL — AB
pH, UA: 6 (ref 5.0–8.0)

## 2020-04-13 MED ORDER — CYCLOBENZAPRINE HCL 5 MG PO TABS: 5.0000 mg | ORAL_TABLET | Freq: Three times a day (TID) | ORAL | 1 refills | Status: DC | PRN

## 2020-04-13 MED FILL — CYCLOBENZAPRINE HCL 5 MG TA: 5 | 20 days supply | Qty: 60 | Fill #0
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
## 2020-04-21 ENCOUNTER — Other Ambulatory Visit: Payer: Self-pay

## 2020-04-30 ENCOUNTER — Other Ambulatory Visit: Payer: Self-pay | Admitting: Family Medicine

## 2020-04-30 ENCOUNTER — Ambulatory Visit (INDEPENDENT_AMBULATORY_CARE_PROVIDER_SITE_OTHER): Payer: No Typology Code available for payment source | Admitting: Family Medicine

## 2020-04-30 ENCOUNTER — Other Ambulatory Visit: Payer: Self-pay

## 2020-04-30 VITALS — BP 110/60 | HR 84 | Temp 97.3°F | Resp 16 | Ht 63.0 in | Wt 125.0 lb

## 2020-04-30 DIAGNOSIS — M25512 Pain in left shoulder: Secondary | ICD-10-CM | POA: Diagnosis not present

## 2020-04-30 MED ORDER — METHOCARBAMOL 500 MG PO TABS: 500.0000 mg | ORAL_TABLET | Freq: Four times a day (QID) | ORAL | 0 refills | Status: DC | PRN

## 2020-04-30 MED ORDER — MELOXICAM 15 MG PO TABS: 15.0000 mg | ORAL_TABLET | Freq: Every day | ORAL | 0 refills | Status: DC

## 2020-04-30 MED FILL — MELOXICAM 15 MG TABLET: 15 | 30 days supply | Qty: 30 | Fill #0

## 2020-04-30 MED FILL — METHOCARBAMOL 500 MG TABS: 500 | 15 days supply | Qty: 60 | Fill #0

## 2020-04-30 NOTE — Patient Instructions (Signed)
Start meloxicam 15 mg once daily.  Start robaxin 500 mg one every 6 hours as needed for muscle pain.  Get xray Refer to physical therapy.
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
## 2020-05-01 ENCOUNTER — Encounter: Payer: Self-pay | Admitting: Family Medicine

## 2020-05-03 ENCOUNTER — Ambulatory Visit (HOSPITAL_COMMUNITY)
Admission: RE | Admit: 2020-05-03 | Discharge: 2020-05-03 | Disposition: A | Discharge status: Home / Self Care | Payer: No Typology Code available for payment source | Source: Ambulatory Visit | Attending: Family Medicine | Admitting: Family Medicine

## 2020-05-03 ENCOUNTER — Other Ambulatory Visit: Payer: Self-pay

## 2020-05-03 DIAGNOSIS — M25512 Pain in left shoulder: Secondary | ICD-10-CM | POA: Insufficient documentation

## 2020-05-10 ENCOUNTER — Telehealth: Payer: Self-pay

## 2020-05-10 NOTE — Telephone Encounter (Signed)
Deserae called to request that her referral for PT be sent to [facility].  The previous order was sent to [facility].  Message sent to [place].

## 2020-05-11 ENCOUNTER — Telehealth: Payer: Self-pay

## 2020-05-11 NOTE — Telephone Encounter (Signed)
I redid pt's referral in Epic to [facility]. It is now in their workque for scheduling.

## 2020-05-12 NOTE — Telephone Encounter (Signed)
Agree. Kc  

## 2020-05-13 ENCOUNTER — Telehealth: Payer: Self-pay

## 2020-05-17 ENCOUNTER — Telehealth: Payer: Self-pay

## 2020-05-17 NOTE — Telephone Encounter (Signed)
error 

## 2020-05-17 NOTE — Telephone Encounter (Signed)
Dr. [facility]/Carolyn please advise.  Pt called stating before her PT can be scheduled an for her work to accept her work note she has to have papers filled out. She emailed these to me cause she couldn't get it uploaded to My Chart an I printed them. I will give these to Carolyn-pt is asking for these to be completed by Thursday of this week since that is when her PT appointment is scheduled. She said you could call her with any questions about this form.

## 2020-05-18 NOTE — Telephone Encounter (Signed)
Done. Kc  

## 2020-05-19 NOTE — Progress Notes (Signed)
Date:  05/30/2020   ID:  Harmon Dun, DOB 03/25/1991, MRN [id number]  PCP:  Blane Ohara, MD   Chief Complaint:  Left shoulder pain  History of Present Illness:    Brenda Mcmahon is a 30 y.o. female with left shoulder pain follow up- states she has had pain since last week.c/o of having a sharp pain that radiates from her should down her back. Can not seem to get comfortable regardless of laying down or standing. Does not feel that her current medications are helping   No past medical history on file.  Past Surgical History:  Procedure Laterality Date  . FOOT FRACTURE SURGERY Right 2015   also ankle fracture repaired.   . WRIST FRACTURE SURGERY Left    30 yo.  fell through glass door.     Family History  Problem Relation Age of Onset  . Seizures Mother   . Hypotension Mother   . Asthma Brother     Social History   Socioeconomic History  . Marital status: Single    Spouse name: Not on file  . Number of children: Not on file  . Years of education: Not on file  . Highest education level: Not on file  Occupational History  . Occupation: Pharmacologist    Employer: [employer]  Tobacco Use  . Smoking status: Never Smoker  . Smokeless tobacco: Never Used  Substance and Sexual Activity  . Alcohol use: No  . Drug use: Never  . Sexual activity: Not on file  Other Topics Concern  . Not on file  Social History Narrative  . Not on file   Social Determinants of Health   Financial Resource Strain: Not on file  Food Insecurity: Not on file  Transportation Needs: Not on file  Physical Activity: Not on file  Stress: Not on file  Social Connections: Not on file  Intimate Partner Violence: Not on file    Outpatient Medications Prior to Visit  Medication Sig Dispense Refill  . citalopram (CELEXA) 20 MG tablet Take 1 tablet (20 mg total) by mouth daily. 30 tablet 3  . medroxyPROGESTERone (DEPO-PROVERA) 150 MG/ML injection Inject into the muscle.    .  meloxicam (MOBIC) 15 MG tablet Take 1 tablet (15 mg total) by mouth daily. 30 tablet 0  . methocarbamol (ROBAXIN) 500 MG tablet Take 1 tablet (500 mg total) by mouth every 6 (six) hours as needed for muscle spasms. 60 tablet 0   No facility-administered medications prior to visit.    Allergies:   Hydrocodone, Kale, Oxycodone, Lactose intolerance (gi), and Hydrocodone-acetaminophen   Social History   Tobacco Use  . Smoking status: Never Smoker  . Smokeless tobacco: Never Used  Substance Use Topics  . Alcohol use: No  . Drug use: Never     Review of Systems  Constitutional: Negative for chills, fever and malaise/fatigue.  HENT: Negative for ear pain, sinus pain and sore throat.   Respiratory: Negative for cough and shortness of breath.   Cardiovascular: Negative for chest pain.  Musculoskeletal: Negative for joint pain and myalgias.       Shoulder pain  Neurological: Negative for headaches.     Labs/Other Tests and Data Reviewed:    Recent Labs: 01/14/2020: ALT 9; BUN 7; Creatinine, Ser 0.86; Hemoglobin 12.6; Platelets 185; Potassium 4.1; Sodium 137; TSH 1.240   Recent Lipid Panel No results found for: CHOL, TRIG, HDL, CHOLHDL, LDLCALC, LDLDIRECT  Wt Readings from Last 3 Encounters:  05/20/20  119 lb (54 kg)  04/30/20 125 lb (56.7 kg)  04/13/20 122 lb 12.8 oz (55.7 kg)     Objective:    Vital Signs:  BP (!) 100/58   Pulse 84   Temp (!) 94.6 F (34.8 C)   Ht 5\' 3"  (1.6 m)   Wt 119 lb (54 kg)   SpO2 100%   BMI 21.08 kg/m    Physical Exam Vitals reviewed.  Constitutional:      Appearance: Normal appearance.  Cardiovascular:     Rate and Rhythm: Normal rate and regular rhythm.     Heart sounds: Normal heart sounds.  Pulmonary:     Effort: Pulmonary effort is normal.     Breath sounds: Normal breath sounds.  Musculoskeletal:        General: Tenderness (anterior left shoulder. Discomfort wiith abduction., external rotation, internal rotation.) present.   Neurological:     Mental Status: She is alert.     ASSESSMENT & PLAN:   1. Acute pain of left shoulder - Ambulatory referral to Physical Therapy  Continue current medicines.  If no improvement with physical therapy, pt to call and I will order an mri of left shoulder.   Orders Placed This Encounter  Procedures  . Ambulatory referral to Physical Therapy    Follow Up:  In Person in 4 week(s)  Signed,  , MD  05/30/2020 10:48 PM    [facility] [place]

## 2020-05-20 ENCOUNTER — Ambulatory Visit (INDEPENDENT_AMBULATORY_CARE_PROVIDER_SITE_OTHER): Payer: No Typology Code available for payment source | Admitting: Family Medicine

## 2020-05-20 VITALS — BP 100/58 | HR 84 | Temp 94.6°F | Ht 63.0 in | Wt 119.0 lb

## 2020-05-20 DIAGNOSIS — M25512 Pain in left shoulder: Secondary | ICD-10-CM

## 2020-05-30 ENCOUNTER — Encounter: Payer: Self-pay | Admitting: Family Medicine

## 2020-06-10 ENCOUNTER — Encounter: Payer: Self-pay | Admitting: Physical Therapy

## 2020-06-10 ENCOUNTER — Other Ambulatory Visit: Payer: Self-pay | Admitting: Family Medicine

## 2020-06-10 ENCOUNTER — Other Ambulatory Visit: Payer: Self-pay

## 2020-06-10 ENCOUNTER — Ambulatory Visit: Payer: No Typology Code available for payment source | Attending: Family Medicine | Admitting: Physical Therapy

## 2020-06-10 DIAGNOSIS — R252 Cramp and spasm: Secondary | ICD-10-CM | POA: Diagnosis present

## 2020-06-10 DIAGNOSIS — M6281 Muscle weakness (generalized): Secondary | ICD-10-CM | POA: Diagnosis present

## 2020-06-10 DIAGNOSIS — M25512 Pain in left shoulder: Secondary | ICD-10-CM

## 2020-06-10 DIAGNOSIS — R293 Abnormal posture: Secondary | ICD-10-CM | POA: Insufficient documentation

## 2020-06-10 DIAGNOSIS — M25622 Stiffness of left elbow, not elsewhere classified: Secondary | ICD-10-CM | POA: Insufficient documentation
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
## 2020-06-10 NOTE — Progress Notes (Signed)
Physical Therapy stated patient is in severe pain and unable to use her left arm. She is very limited.  They recommended orthopedic referral and MRI of shoulder. Which I agree with and will proceed. Orders place.

## 2020-06-11 ENCOUNTER — Other Ambulatory Visit: Payer: Self-pay | Admitting: Family Medicine

## 2020-06-11 DIAGNOSIS — M25512 Pain in left shoulder: Secondary | ICD-10-CM

## 2020-06-16 ENCOUNTER — Ambulatory Visit
Admission: RE | Admit: 2020-06-16 | Discharge: 2020-06-16 | Disposition: A | Discharge status: Home / Self Care | Payer: No Typology Code available for payment source | Source: Ambulatory Visit | Attending: Family Medicine | Admitting: Family Medicine

## 2020-06-16 ENCOUNTER — Other Ambulatory Visit: Payer: Self-pay

## 2020-06-16 DIAGNOSIS — M25512 Pain in left shoulder: Secondary | ICD-10-CM

## 2020-06-18 ENCOUNTER — Encounter: Payer: Self-pay | Admitting: Orthopaedic Surgery

## 2020-06-18 ENCOUNTER — Ambulatory Visit (INDEPENDENT_AMBULATORY_CARE_PROVIDER_SITE_OTHER): Payer: No Typology Code available for payment source

## 2020-06-18 ENCOUNTER — Other Ambulatory Visit (HOSPITAL_COMMUNITY): Payer: Self-pay | Admitting: Physician Assistant

## 2020-06-18 ENCOUNTER — Ambulatory Visit (INDEPENDENT_AMBULATORY_CARE_PROVIDER_SITE_OTHER): Payer: No Typology Code available for payment source | Admitting: Orthopaedic Surgery

## 2020-06-18 DIAGNOSIS — M542 Cervicalgia: Secondary | ICD-10-CM | POA: Diagnosis not present

## 2020-06-18 MED ORDER — PREDNISONE 10 MG (21) PO TBPK: ORAL_TABLET | ORAL | 0 refills | Status: DC

## 2020-06-18 MED FILL — predniSONE 10 MG TABS: 10 | 6 days supply | Qty: 21 | Fill #0
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
## 2020-06-22 ENCOUNTER — Telehealth: Payer: Self-pay

## 2020-06-22 NOTE — Telephone Encounter (Signed)
Pt came into the office stating her restrictions are  15 lbs till you attend physical therapy.  1st two weeks are a half a day so 4 hours.  Until pt goes to pt they don't want pt reaching over her head.  She is to return on the 15th back to work

## 2020-06-22 NOTE — Telephone Encounter (Signed)
Can we make sure to put this in her paperwork?

## 2020-06-22 NOTE — Telephone Encounter (Signed)
See message below. She will bring FMLA forms. Can we attach this to paper work.

## 2020-06-23 ENCOUNTER — Telehealth: Payer: Self-pay | Admitting: Orthopaedic Surgery

## 2020-06-23 NOTE — Telephone Encounter (Signed)
Received $25.00 cash and disability paperwork for [employer]/ forwarding to [employer] today via courier

## 2020-06-28 ENCOUNTER — Telehealth: Payer: Self-pay | Admitting: Orthopaedic Surgery

## 2020-06-28 ENCOUNTER — Telehealth: Payer: Self-pay

## 2020-06-28 ENCOUNTER — Ambulatory Visit: Payer: No Typology Code available for payment source | Admitting: Family Medicine

## 2020-06-28 NOTE — Telephone Encounter (Signed)
Note ready for pick up at the front desk. Patient aware. ? ?

## 2020-06-28 NOTE — Telephone Encounter (Signed)
Work note ready for pick up.  Patient aware 

## 2020-06-28 NOTE — Telephone Encounter (Signed)
Okay for note? With or w/o restrictions? Patient will pick up today.

## 2020-06-28 NOTE — Telephone Encounter (Signed)
Ok to do.  Restrictions in message

## 2020-06-28 NOTE — Telephone Encounter (Signed)
Pt needs a go back to work note. She will be coming in today to get it!

## 2020-06-29 ENCOUNTER — Ambulatory Visit: Payer: No Typology Code available for payment source | Attending: Family Medicine | Admitting: Physical Therapy

## 2020-06-29 ENCOUNTER — Encounter: Payer: Self-pay | Admitting: Physical Therapy

## 2020-06-29 ENCOUNTER — Other Ambulatory Visit: Payer: Self-pay

## 2020-06-29 DIAGNOSIS — R293 Abnormal posture: Secondary | ICD-10-CM | POA: Diagnosis present

## 2020-06-29 DIAGNOSIS — M6281 Muscle weakness (generalized): Secondary | ICD-10-CM | POA: Insufficient documentation

## 2020-06-29 DIAGNOSIS — R252 Cramp and spasm: Secondary | ICD-10-CM | POA: Diagnosis present

## 2020-06-29 DIAGNOSIS — M25512 Pain in left shoulder: Secondary | ICD-10-CM | POA: Diagnosis present

## 2020-06-29 DIAGNOSIS — M542 Cervicalgia: Secondary | ICD-10-CM | POA: Insufficient documentation

## 2020-06-29 DIAGNOSIS — M25622 Stiffness of left elbow, not elsewhere classified: Secondary | ICD-10-CM | POA: Insufficient documentation
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
## 2020-06-29 NOTE — Patient Instructions (Signed)
   Garen Lah, PT, [facility] Certified Exercise Expert for the Aging Adult  06/29/20 10:18 AM Phone: [phone] Fax: [phone]

## 2020-07-01 ENCOUNTER — Ambulatory Visit: Payer: No Typology Code available for payment source | Admitting: Physical Therapy

## 2020-07-06 ENCOUNTER — Encounter: Payer: Self-pay | Admitting: Physical Therapy

## 2020-07-06 ENCOUNTER — Other Ambulatory Visit: Payer: Self-pay

## 2020-07-06 ENCOUNTER — Telehealth: Payer: Self-pay

## 2020-07-06 ENCOUNTER — Ambulatory Visit: Payer: No Typology Code available for payment source | Admitting: Physical Therapy

## 2020-07-06 DIAGNOSIS — M542 Cervicalgia: Secondary | ICD-10-CM | POA: Diagnosis not present

## 2020-07-06 DIAGNOSIS — M25512 Pain in left shoulder: Secondary | ICD-10-CM

## 2020-07-06 DIAGNOSIS — M6281 Muscle weakness (generalized): Secondary | ICD-10-CM

## 2020-07-06 DIAGNOSIS — R252 Cramp and spasm: Secondary | ICD-10-CM

## 2020-07-06 DIAGNOSIS — R293 Abnormal posture: Secondary | ICD-10-CM

## 2020-07-06 DIAGNOSIS — M25622 Stiffness of left elbow, not elsewhere classified: Secondary | ICD-10-CM
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
## 2020-07-06 NOTE — Patient Instructions (Signed)

## 2020-07-06 NOTE — Telephone Encounter (Signed)
Pt left VM stating she has been having troubles sleeping. When she sleeps she awakens with heart racing, sweating, like she is having an anxiety attack. States this has been going on almost two months.   Called pt back and scheduled appointment w/ Dr. Sedalia Muta. First available, Thursday 3/24.   Lorita Officer, CCMA 07/06/20 10:40 AM

## 2020-07-07 NOTE — Progress Notes (Signed)
Subjective:  Patient ID: Brenda Mcmahon, female    DOB: 28-Sep-1990  Age: 30 y.o. MRN: [id number]  Chief Complaint  Patient presents with  . Insomnia    HPI Insomnia-patient states she will wake up in the middle of the night with her heart racing,chest pain, sweaty palms and cold sweats. States she has been waking up every single night and averages maybe 3-4 hours of sleep. Every night she is dreaming that she is being hit by a car or in the hospital with doctors and staff looking at her. States in her dreams she feels like she is dying. Has tried melatonin and nyquil which have not helped. Patient just returned to work last week working half days 07:30 a.m. to 11:30 a.m.   Current Outpatient Medications on File Prior to Visit  Medication Sig Dispense Refill  . citalopram (CELEXA) 20 MG tablet Take 1 tablet (20 mg total) by mouth daily. 30 tablet 3  . medroxyPROGESTERone (DEPO-PROVERA) 150 MG/ML injection Inject into the muscle.     No current facility-administered medications on file prior to visit.   No past medical history on file. Past Surgical History:  Procedure Laterality Date  . FOOT FRACTURE SURGERY Right 2015   also ankle fracture repaired.   . WRIST FRACTURE SURGERY Left    30 yo.  fell through glass door.     Family History  Problem Relation Age of Onset  . Seizures Mother   . Hypotension Mother   . Asthma Brother    Social History   Socioeconomic History  . Marital status: Single    Spouse name: Not on file  . Number of children: Not on file  . Years of education: Not on file  . Highest education level: Not on file  Occupational History  . Occupation: Pharmacologist    Employer: [employer]  Tobacco Use  . Smoking status: Never Smoker  . Smokeless tobacco: Never Used  Substance and Sexual Activity  . Alcohol use: No  . Drug use: Never  . Sexual activity: Not on file  Other Topics Concern  . Not on file  Social History Narrative  . Not on file    Social Determinants of Health   Financial Resource Strain: Not on file  Food Insecurity: Not on file  Transportation Needs: Not on file  Physical Activity: Not on file  Stress: Not on file  Social Connections: Not on file    Review of Systems  Constitutional: Positive for fatigue. Negative for chills and fever.  HENT: Negative for congestion, ear pain, rhinorrhea and sore throat.   Respiratory: Negative for cough and shortness of breath.   Cardiovascular: Positive for chest pain.  Gastrointestinal: Negative for constipation, diarrhea, nausea and vomiting.  Genitourinary: Negative for dysuria, frequency and hematuria.  Musculoskeletal: Positive for arthralgias, back pain and myalgias.  Neurological: Positive for weakness and headaches.  Psychiatric/Behavioral: Positive for dysphoric mood and sleep disturbance. The patient is nervous/anxious.      Objective:  BP 122/62   Pulse (!) 116   Temp (!) 97.3 F (36.3 C)   Ht 5\' 2"  (1.575 m)   Wt 122 lb (55.3 kg)   SpO2 99%   BMI 22.31 kg/m   BP/Weight 07/08/2020 05/20/2020 04/30/2020  Systolic BP 122 100 110  Diastolic BP 62 58 60  Wt. (Lbs) 122 119 125  BMI 22.31 21.08 22.14    Physical Exam Vitals reviewed.  Constitutional:      Appearance: Normal appearance. She  is normal weight.  Neck:     Vascular: No carotid bruit.  Cardiovascular:     Rate and Rhythm: Normal rate and regular rhythm.     Pulses: Normal pulses.     Heart sounds: Murmur heard.    Pulmonary:     Effort: Pulmonary effort is normal. No respiratory distress.     Breath sounds: Normal breath sounds.  Abdominal:     General: Abdomen is flat. Bowel sounds are normal.     Palpations: Abdomen is soft.     Tenderness: There is no abdominal tenderness.  Musculoskeletal:     Right shoulder: Normal.     Left shoulder: Decreased range of motion.     Comments: Left shoulder limited abduction. FROM of neck.  Neurological:     Mental Status: She is alert and  oriented to person, place, and time.  Psychiatric:        Mood and Affect: Mood is anxious. Affect is tearful.        Behavior: Behavior normal.     Diabetic Foot Exam - Simple   No data filed      Lab Results  Component Value Date   WBC 5.1 01/14/2020   HGB 12.6 01/14/2020   HCT 37.3 01/14/2020   PLT 185 01/14/2020   GLUCOSE 89 01/14/2020   ALT 9 01/14/2020   AST 16 01/14/2020   NA 137 01/14/2020   K 4.1 01/14/2020   CL 102 01/14/2020   CREATININE 0.86 01/14/2020   BUN 7 01/14/2020   CO2 24 01/14/2020   TSH 1.240 01/14/2020      Assessment & Plan:   1. Palpitations - EKG NSR no st changes.  - Order ZIO monitor. 2. Heart murmur - Order Echo - Order ZIO monitor.  3. PTSD (post-traumatic stress disorder) - Start Prozac 20 mg 1 tabled daily #30. - Continue with counseling.  4. Other insomnia Education given.   5. Panic attacks - Ativan 0.5 mg 1-2 PO QD PRN. - Follow up in 3 weeks.  Clayborn Bigness I Leal-Borjas,acting as a scribe for Blane Ohara, MD.,have documented all relevant documentation on the behalf of Blane Ohara, MD,as directed by  Blane Ohara, MD while in the presence of Blane Ohara, MD.  Follow-up: Return in about 3 weeks (around 07/29/2020).  An After Visit Summary was printed and given to the patient.  Blane Ohara, MD  Family Practice [phone]

## 2020-07-08 ENCOUNTER — Ambulatory Visit (INDEPENDENT_AMBULATORY_CARE_PROVIDER_SITE_OTHER): Payer: No Typology Code available for payment source | Admitting: Family Medicine

## 2020-07-08 ENCOUNTER — Other Ambulatory Visit: Payer: Self-pay

## 2020-07-08 ENCOUNTER — Ambulatory Visit: Payer: No Typology Code available for payment source | Admitting: Physical Therapy

## 2020-07-08 VITALS — BP 122/62 | HR 116 | Temp 97.3°F | Ht 62.0 in | Wt 122.0 lb

## 2020-07-08 DIAGNOSIS — R002 Palpitations: Secondary | ICD-10-CM | POA: Diagnosis not present

## 2020-07-08 DIAGNOSIS — F4310 Post-traumatic stress disorder, unspecified: Secondary | ICD-10-CM | POA: Diagnosis not present

## 2020-07-08 DIAGNOSIS — G4709 Other insomnia: Secondary | ICD-10-CM | POA: Diagnosis not present

## 2020-07-08 DIAGNOSIS — R011 Cardiac murmur, unspecified: Secondary | ICD-10-CM | POA: Diagnosis not present

## 2020-07-08 DIAGNOSIS — F410 Panic disorder [episodic paroxysmal anxiety] without agoraphobia: Secondary | ICD-10-CM

## 2020-07-12 ENCOUNTER — Encounter: Payer: Self-pay | Admitting: Family Medicine

## 2020-07-13 ENCOUNTER — Encounter: Payer: Self-pay | Admitting: Family Medicine

## 2020-07-13 ENCOUNTER — Ambulatory Visit: Payer: No Typology Code available for payment source | Admitting: Physical Therapy

## 2020-07-13 ENCOUNTER — Other Ambulatory Visit: Payer: Self-pay | Admitting: Family Medicine

## 2020-07-13 MED ORDER — FLUOXETINE HCL 20 MG PO TABS: 20.0000 mg | ORAL_TABLET | Freq: Every day | ORAL | 0 refills | Status: DC

## 2020-07-13 MED ORDER — LORAZEPAM 0.5 MG PO TABS: 0.5000 mg | ORAL_TABLET | Freq: Two times a day (BID) | ORAL | 1 refills | Status: DC | PRN

## 2020-07-13 MED FILL — FLUoxetine HCL 20 MG TABS: 20 | 30 days supply | Qty: 30 | Fill #0

## 2020-07-13 MED FILL — LORazepam 0.5 MG TABS: 0.5 | 15 days supply | Qty: 30 | Fill #0

## 2020-07-15 ENCOUNTER — Other Ambulatory Visit: Payer: Self-pay

## 2020-07-15 ENCOUNTER — Ambulatory Visit: Payer: No Typology Code available for payment source | Admitting: Physical Therapy

## 2020-07-15 ENCOUNTER — Encounter: Payer: Self-pay | Admitting: Physical Therapy

## 2020-07-15 DIAGNOSIS — R252 Cramp and spasm: Secondary | ICD-10-CM

## 2020-07-15 DIAGNOSIS — R293 Abnormal posture: Secondary | ICD-10-CM

## 2020-07-15 DIAGNOSIS — M542 Cervicalgia: Secondary | ICD-10-CM | POA: Diagnosis not present

## 2020-07-15 DIAGNOSIS — M25622 Stiffness of left elbow, not elsewhere classified: Secondary | ICD-10-CM

## 2020-07-15 DIAGNOSIS — M6281 Muscle weakness (generalized): Secondary | ICD-10-CM

## 2020-07-15 DIAGNOSIS — M25512 Pain in left shoulder: Secondary | ICD-10-CM
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
## 2020-07-20 ENCOUNTER — Encounter: Payer: No Typology Code available for payment source | Admitting: Physical Therapy

## 2020-07-22 ENCOUNTER — Other Ambulatory Visit: Payer: Self-pay

## 2020-07-22 ENCOUNTER — Ambulatory Visit: Payer: No Typology Code available for payment source | Attending: Family Medicine | Admitting: Physical Therapy

## 2020-07-22 ENCOUNTER — Encounter: Payer: Self-pay | Admitting: Physical Therapy

## 2020-07-22 DIAGNOSIS — R293 Abnormal posture: Secondary | ICD-10-CM | POA: Diagnosis present

## 2020-07-22 DIAGNOSIS — R252 Cramp and spasm: Secondary | ICD-10-CM | POA: Insufficient documentation

## 2020-07-22 DIAGNOSIS — M542 Cervicalgia: Secondary | ICD-10-CM | POA: Diagnosis not present

## 2020-07-22 DIAGNOSIS — M25512 Pain in left shoulder: Secondary | ICD-10-CM | POA: Insufficient documentation

## 2020-07-22 DIAGNOSIS — M6281 Muscle weakness (generalized): Secondary | ICD-10-CM | POA: Diagnosis present

## 2020-07-22 DIAGNOSIS — M25622 Stiffness of left elbow, not elsewhere classified: Secondary | ICD-10-CM | POA: Insufficient documentation
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
## 2020-07-24 NOTE — Progress Notes (Signed)
Cardiology Office Note:    Date:  07/26/2020   ID:  Brenda Mcmahon, DOB 1991-02-28, MRN [id number]  PCP:  Blane Ohara, MD  Cardiologist:  Norman Herrlich, MD   Referring MD: Blane Ohara, MD  ASSESSMENT:    1. Palpitations   2. Heart murmur    PLAN:    In order of problems listed above:  1. She is having episodes of rapid heart rhythm unfortunately not documented but a smart phone giving a rate of 210 bpm most consistent with paroxysmal SVT.  Today were can apply a live monitor to capture episodes and hold off on treatment until I see a strip of her clinical arrhythmia.  If SVT will start on a beta-blocker and if breakthrough episodes refer to EP. 2. Physical exam suggest mitral valve prolapse check echocardiogram regarding underlying valvular heart disease and also to exclude tachycardia induced cardiomyopathy with the frequency and severity of rapid heart rhythm. 3. For completeness recheck labs CBC thyroid magnesium and CMP  Next appointment 3 weeks   Medication Adjustments/Labs and Tests Ordered: Current medicines are reviewed at length with the patient today.  Concerns regarding medicines are outlined above.  Orders Placed This Encounter  Procedures  . CBC  . TSH+T4F+T3Free  . Comprehensive metabolic panel  . Magnesium  . LONG TERM MONITOR-LIVE TELEMETRY (3-14 DAYS)  . ECHOCARDIOGRAM COMPLETE   No orders of the defined types were placed in this encounter.    Chief Complaint  Patient presents with  . Palpitations    Rapid heart rate    History of Present Illness:    Brenda Mcmahon is a 30 y.o. female who is being seen today for the evaluation of duration at the request of Cox, Kirsten, MD.  Personally reviewed her EKG 07/12/2020 showing sinus rhythm normal EKG  She has been troubled for the last few months with episodes of palpitation. This clusters at nighttime and at times awakens her from her sleep Her pulse is very rapid it makes her apprehensive  and weak and when the episodes are over she has a perception her pulse is too slow. She recently had an episode that pretty much lasted all day. She has a smart watch and she received an alert of the pulse was 210 bpm. She takes no over-the-counter proarrhythmic drugs and has no known history of heart disease congenital rheumatic or arrhythmia. There is no family history of arrhythmia or cardiomyopathy. In general she does not feel well she also has exertional shortness of breath no edema orthopnea and she has fatigue that has been persistent for several months.  She has had no fever or chills.  History reviewed. No pertinent past medical history.  Past Surgical History:  Procedure Laterality Date  . FOOT FRACTURE SURGERY Right 2015   also ankle fracture repaired.   . WRIST FRACTURE SURGERY Left    30 yo.  fell through glass door.     Current Medications: Current Meds  Medication Sig  . FLUoxetine (PROZAC) 20 MG tablet TAKE 1 TABLET (20 MG TOTAL) BY MOUTH DAILY.  Marland Kitchen LORazepam (ATIVAN) 0.5 MG tablet TAKE 1 TABLET (0.5 MG TOTAL) BY MOUTH 2 (TWO) TIMES DAILY AS NEEDED FOR ANXIETY.  . medroxyPROGESTERone (DEPO-PROVERA) 150 MG/ML injection Inject into the muscle.     Allergies:   Hydrocodone, Kale, Oxycodone, Lactose intolerance (gi), and Hydrocodone-acetaminophen   Social History   Socioeconomic History  . Marital status: Single    Spouse name: Not on file  . Number  of children: Not on file  . Years of education: Not on file  . Highest education level: Not on file  Occupational History  . Occupation: Pharmacologist    Employer: [employer]  Tobacco Use  . Smoking status: Never Smoker  . Smokeless tobacco: Never Used  Substance and Sexual Activity  . Alcohol use: No  . Drug use: Never  . Sexual activity: Not on file  Other Topics Concern  . Not on file  Social History Narrative  . Not on file   Social Determinants of Health   Financial Resource Strain: Not on file   Food Insecurity: Not on file  Transportation Needs: Not on file  Physical Activity: Not on file  Stress: Not on file  Social Connections: Not on file     Family History: The patient's family history includes Asthma in her brother; Hypotension in her mother; Seizures in her mother.  ROS:   ROS Please see the history of present illness.     All other systems reviewed and are negative.  EKGs/Labs/Other Studies Reviewed:    The following studies were reviewed today:  Recent Labs: 01/14/2020: ALT 9; BUN 7; Creatinine, Ser 0.86; Hemoglobin 12.6; Platelets 185; Potassium 4.1; Sodium 137; TSH 1.240  Recent Lipid Panel No results found for: CHOL, TRIG, HDL, CHOLHDL, VLDL, LDLCALC, LDLDIRECT  Physical Exam:    VS:  BP (!) 120/58 (BP Location: Right Arm, Patient Position: Sitting)   Pulse (!) 106   Ht 5\' 2"  (1.575 m)   Wt 124 lb 1.3 oz (56.3 kg)   SpO2 98%   BMI 22.69 kg/m     Wt Readings from Last 3 Encounters:  07/26/20 124 lb 1.3 oz (56.3 kg)  07/08/20 122 lb (55.3 kg)  05/20/20 119 lb (54 kg)     GEN: Appears healthy well nourished, well developed in no acute distress HEENT: Normal NECK: No JVD; No carotid bruits LYMPHATICS: No lymphadenopathy CARDIAC: She has early to midsystolic ejection clicks and grade 1/6 to 2/6 left sternal border mitral regurgitation RRR, no murmurs, rubs, gallops RESPIRATORY:  Clear to auscultation without rales, wheezing or rhonchi  ABDOMEN: Soft, non-tender, non-distended MUSCULOSKELETAL:  No edema; No deformity  SKIN: Warm and dry NEUROLOGIC:  Alert and oriented x 3 PSYCHIATRIC:  Normal affect     Signed, 07/18/20, MD  07/26/2020 4:19 PM     [facility]

## 2020-07-26 ENCOUNTER — Ambulatory Visit: Payer: No Typology Code available for payment source | Admitting: Cardiology

## 2020-07-26 ENCOUNTER — Ambulatory Visit (INDEPENDENT_AMBULATORY_CARE_PROVIDER_SITE_OTHER): Payer: No Typology Code available for payment source

## 2020-07-26 ENCOUNTER — Encounter: Payer: Self-pay | Admitting: Cardiology

## 2020-07-26 ENCOUNTER — Other Ambulatory Visit: Payer: Self-pay

## 2020-07-26 VITALS — BP 120/58 | HR 106 | Ht 62.0 in | Wt 124.1 lb

## 2020-07-26 DIAGNOSIS — R011 Cardiac murmur, unspecified: Secondary | ICD-10-CM

## 2020-07-26 DIAGNOSIS — R002 Palpitations: Secondary | ICD-10-CM | POA: Diagnosis not present

## 2020-07-26 NOTE — Patient Instructions (Signed)
Medication Instructions:  Your physician recommends that you continue on your current medications as directed. Please refer to the Current Medication list given to you today.  *If you need a refill on your cardiac medications before your next appointment, please call your pharmacy*   Lab Work: None If you have labs (blood work) drawn today and your tests are completely normal, you will receive your results only by: Marland Kitchen MyChart Message (if you have MyChart) OR . A paper copy in the mail If you have any lab test that is abnormal or we need to change your treatment, we will call you to review the results.   Testing/Procedures: Your physician has requested that you have an echocardiogram. Echocardiography is a painless test that uses sound waves to create images of your heart. It provides your doctor with information about the size and shape of your heart and how well your heart's chambers and valves are working. This procedure takes approximately one hour. There are no restrictions for this procedure.  A zio monitor was ordered today. It will remain on for 14 days. You will then return monitor and event diary in provided box. It takes 1-2 weeks for report to be downloaded and returned to [place]. We will call you with the results. If monitor falls off or has orange flashing light, please call Zio for further instructions.      Follow-Up: At [facility], you and your health needs are our priority.  As part of our continuing mission to provide you with exceptional heart care, we have created designated Provider Care Teams.  These Care Teams include your primary Cardiologist (physician) and Advanced Practice Providers (APPs -  Physician Assistants and Nurse Practitioners) who all work together to provide you with the care you need, when you need it.  We recommend signing up for the patient portal called "MyChart".  Sign up information is provided on this After Visit Summary.  MyChart is used to  connect with patients for Virtual Visits (Telemedicine).  Patients are able to view lab/test results, encounter notes, upcoming appointments, etc.  Non-urgent messages can be sent to your provider as well.   To learn more about what you can do with MyChart, go to [email or web address].    Your next appointment:   3 week(s)  The format for your next appointment:   In Person  Provider:   You may see any provider   Other Instructions

## 2020-07-27 ENCOUNTER — Telehealth: Payer: Self-pay

## 2020-07-27 ENCOUNTER — Ambulatory Visit: Payer: No Typology Code available for payment source | Admitting: Physical Therapy

## 2020-07-27 LAB — CBC
Hematocrit: 38.9 % (ref 34.0–46.6)
Hemoglobin: 12.6 g/dL (ref 11.1–15.9)
MCH: 28.2 pg (ref 26.6–33.0)
MCHC: 32.4 g/dL (ref 31.5–35.7)
MCV: 87 fL (ref 79–97)
Platelets: 216 10*3/uL (ref 150–450)
RBC: 4.47 x10E6/uL (ref 3.77–5.28)
RDW: 11.4 % — ABNORMAL LOW (ref 11.7–15.4)
WBC: 4.6 10*3/uL (ref 3.4–10.8)

## 2020-07-27 LAB — TSH+T4F+T3FREE
Free T4: 1.14 ng/dL (ref 0.82–1.77)
T3, Free: 3 pg/mL (ref 2.0–4.4)
TSH: 1.03 u[IU]/mL (ref 0.450–4.500)

## 2020-07-27 LAB — COMPREHENSIVE METABOLIC PANEL
ALT: 8 IU/L (ref 0–32)
AST: 11 IU/L (ref 0–40)
Albumin/Globulin Ratio: 2.1 (ref 1.2–2.2)
Albumin: 4.8 g/dL (ref 3.9–5.0)
Alkaline Phosphatase: 53 IU/L (ref 44–121)
BUN/Creatinine Ratio: 9 (ref 9–23)
BUN: 8 mg/dL (ref 6–20)
Bilirubin Total: 0.2 mg/dL (ref 0.0–1.2)
CO2: 19 mmol/L — ABNORMAL LOW (ref 20–29)
Calcium: 9.4 mg/dL (ref 8.7–10.2)
Chloride: 103 mmol/L (ref 96–106)
Creatinine, Ser: 0.89 mg/dL (ref 0.57–1.00)
Globulin, Total: 2.3 g/dL (ref 1.5–4.5)
Glucose: 87 mg/dL (ref 65–99)
Potassium: 3.8 mmol/L (ref 3.5–5.2)
Sodium: 138 mmol/L (ref 134–144)
Total Protein: 7.1 g/dL (ref 6.0–8.5)
eGFR: 91 mL/min/{1.73_m2} (ref >59)

## 2020-07-27 LAB — MAGNESIUM: Magnesium: 2.1 mg/dL (ref 1.6–2.3)

## 2020-07-27 NOTE — Telephone Encounter (Signed)
Left message on patients voicemail to please return our call.   

## 2020-07-27 NOTE — Telephone Encounter (Signed)
-----   Message from Baldo Daub, MD sent at 07/27/2020  7:44 AM EDT ----- All are normal good results

## 2020-07-27 NOTE — Telephone Encounter (Signed)
Spoke with patient regarding results and recommendation.  Patient verbalizes understanding and is agreeable to plan of care. Advised patient to call back with any issues or concerns.  

## 2020-07-27 NOTE — Telephone Encounter (Signed)
-----   Message from Brian J Munley, MD sent at 07/27/2020  7:44 AM EDT ----- All are normal good results 

## 2020-07-29 ENCOUNTER — Other Ambulatory Visit: Payer: Self-pay

## 2020-07-29 ENCOUNTER — Encounter: Payer: Self-pay | Admitting: Orthopaedic Surgery

## 2020-07-29 ENCOUNTER — Ambulatory Visit (INDEPENDENT_AMBULATORY_CARE_PROVIDER_SITE_OTHER): Payer: No Typology Code available for payment source | Admitting: Orthopaedic Surgery

## 2020-07-29 ENCOUNTER — Ambulatory Visit (HOSPITAL_COMMUNITY): Payer: No Typology Code available for payment source | Attending: Cardiology

## 2020-07-29 ENCOUNTER — Encounter: Payer: No Typology Code available for payment source | Admitting: Physical Therapy

## 2020-07-29 ENCOUNTER — Ambulatory Visit: Payer: Self-pay

## 2020-07-29 DIAGNOSIS — M25512 Pain in left shoulder: Secondary | ICD-10-CM | POA: Diagnosis not present

## 2020-07-29 DIAGNOSIS — R002 Palpitations: Secondary | ICD-10-CM | POA: Insufficient documentation

## 2020-07-29 LAB — ECHOCARDIOGRAM COMPLETE
Area-P 1/2: 2.95 cm2
S' Lateral: 2.5 cm

## 2020-07-29 NOTE — Progress Notes (Signed)
Subjective: Patient is here for ultrasound-guided intra-articular left glenohumeral injection.  Has frozen shoulder.  Objective:  Pain and decreased ROM with overhead reach.  Procedure: Ultrasound guided injection is preferred based studies that show increased duration, increased effect, greater accuracy, decreased procedural pain, increased response rate, and decreased cost with ultrasound guided versus blind injection.   Verbal informed consent obtained.  Time-out conducted.  Noted no overlying erythema, induration, or other signs of local infection. Ultrasound-guided left glenohumeral injection: After sterile prep with Betadine, injected 4 cc 0.25% bupivocaine without epinephrine and 6 mg betamethasone using a 22-gauge spinal needle, passing the needle from posterior approach into the glenohumeral joint.  Injectate seen filling joint capsule.

## 2020-07-29 NOTE — Progress Notes (Signed)
Office Visit Note   Patient: Brenda Mcmahon           Date of Birth: 1990/05/24           MRN: [id number] Visit Date: 07/29/2020              Requested by: Blane Ohara, MD [address] [place],  [place] [address] PCP: Blane Ohara, MD   Assessment & Plan: Visit Diagnoses:  1. Left shoulder pain, unspecified chronicity     Plan: Based on findings I feel that her shoulder is more symptomatic and I think that she has mild adhesive capsulitis with myofascial scapular trigger points.  She agreed to a glenohumeral injection today.  She will continue to work with physical therapy to restore range of motion and shoulder function.  She would also benefit from dry needling and modalities for the myofascial scapular trigger points.  Based on discussion she and I agreed to follow-up as needed or if she fails to get improvement from these treatments.  Follow-Up Instructions: Return if symptoms worsen or fail to improve.   Orders:  Orders Placed This Encounter  Procedures  . US Guided Needle Placement - No Linked Charges   No orders of the defined types were placed in this encounter.     Procedures: No procedures performed   Clinical Data: No additional findings.   Subjective: Chief Complaint  Patient presents with  . Neck - Pain, Follow-up    Patient returns today for follow-up of left shoulder pain.  She has done some physical therapy and feels like she is slightly better especially with internal rotation of the left shoulder.  She feels that the prednisone did not help.  She has been back on regular duty.  She switched physical therapists in the meantime.   Review of Systems  Constitutional: Negative.   HENT: Negative.   Eyes: Negative.   Respiratory: Negative.   Cardiovascular: Negative.   Endocrine: Negative.   Musculoskeletal: Negative.   Neurological: Negative.   Hematological: Negative.   Psychiatric/Behavioral: Negative.   All other systems  reviewed and are negative.    Objective: Vital Signs: There were no vitals taken for this visit.  Physical Exam Vitals and nursing note reviewed.  Constitutional:      Appearance: She is well-developed.  Pulmonary:     Effort: Pulmonary effort is normal.  Skin:    General: Skin is warm.     Capillary Refill: Capillary refill takes less than 2 seconds.  Neurological:     Mental Status: She is alert and oriented to person, place, and time.  Psychiatric:        Behavior: Behavior normal.        Thought Content: Thought content normal.        Judgment: Judgment normal.     Ortho Exam Left shoulder shows mild adhesive capsulitis with forward flexion to 160 abduction 90 internal rotation to the buttock.  Strength is grossly intact but slightly limited secondary to pain.  She has some tenderness in the parascapular rhomboid muscles. Specialty Comments:  No specialty comments available.  Imaging: US Guided Needle Placement - No Linked Charges  Result Date: 07/29/2020 Ultrasound guided injection is preferred based studies that show increased duration, increased effect, greater accuracy, decreased procedural pain, increased response rate, and decreased cost with ultrasound guided versus blind injection.   Verbal informed consent obtained.  Time-out conducted.  Noted no overlying erythema, induration, or other signs of local infection.  Ultrasound-guided left glenohumeral injection: After sterile prep with Betadine, injected 4 cc 0.25% bupivocaine without epinephrine and 6 mg betamethasone using a 22-gauge spinal needle, passing the needle from posterior approach into the glenohumeral joint.  Injectate seen filling joint capsule.      PMFS History: Patient Active Problem List   Diagnosis Date Noted  . Achilles bursitis of right lower extremity 03/14/2017  . Depression, major, single episode, moderate (HCC) 03/14/2017   History reviewed. No pertinent past medical history.  Family  History  Problem Relation Age of Onset  . Seizures Mother   . Hypotension Mother   . Asthma Brother     Past Surgical History:  Procedure Laterality Date  . FOOT FRACTURE SURGERY Right 2015   also ankle fracture repaired.   . WRIST FRACTURE SURGERY Left    30 yo.  fell through glass door.    Social History   Occupational History  . Occupation: Pharmacologist    Employer: [employer]  Tobacco Use  . Smoking status: Never Smoker  . Smokeless tobacco: Never Used  Substance and Sexual Activity  . Alcohol use: No  . Drug use: Never  . Sexual activity: Not on file

## 2020-07-30 ENCOUNTER — Telehealth: Payer: Self-pay

## 2020-07-30 ENCOUNTER — Ambulatory Visit: Payer: No Typology Code available for payment source | Admitting: Family Medicine

## 2020-07-30 ENCOUNTER — Ambulatory Visit: Payer: No Typology Code available for payment source | Admitting: Nurse Practitioner

## 2020-07-30 NOTE — Telephone Encounter (Signed)
Spoke with patient regarding results and recommendation.  Patient verbalizes understanding and is agreeable to plan of care. Advised patient to call back with any issues or concerns.  

## 2020-07-30 NOTE — Telephone Encounter (Signed)
Pt is returning call.  

## 2020-07-30 NOTE — Telephone Encounter (Signed)
Left message on patients voicemail to please return our call.   

## 2020-07-30 NOTE — Telephone Encounter (Signed)
-----   Message from Baldo Daub, MD sent at 07/30/2020 12:30 PM EDT ----- This is a good result her echocardiogram was normal  I would still say she has mild mitral valve prolapse from her physical exam and unfortunately posterior supine to do the echocardiogram can diminish the findings  Regardless this is a very good result.

## 2020-08-02 ENCOUNTER — Other Ambulatory Visit (HOSPITAL_COMMUNITY): Payer: Self-pay

## 2020-08-02 ENCOUNTER — Ambulatory Visit (INDEPENDENT_AMBULATORY_CARE_PROVIDER_SITE_OTHER): Payer: No Typology Code available for payment source | Admitting: Nurse Practitioner

## 2020-08-02 ENCOUNTER — Other Ambulatory Visit: Payer: Self-pay

## 2020-08-02 ENCOUNTER — Encounter: Payer: Self-pay | Admitting: Nurse Practitioner

## 2020-08-02 VITALS — BP 124/74 | HR 96 | Temp 97.2°F | Ht 62.0 in | Wt 124.0 lb

## 2020-08-02 DIAGNOSIS — R634 Abnormal weight loss: Secondary | ICD-10-CM | POA: Diagnosis not present

## 2020-08-02 DIAGNOSIS — F321 Major depressive disorder, single episode, moderate: Secondary | ICD-10-CM

## 2020-08-02 DIAGNOSIS — J301 Allergic rhinitis due to pollen: Secondary | ICD-10-CM

## 2020-08-02 DIAGNOSIS — F419 Anxiety disorder, unspecified: Secondary | ICD-10-CM

## 2020-08-02 MED ORDER — FLUOXETINE HCL 40 MG PO CAPS
40.0000 mg | ORAL_CAPSULE | Freq: Every day | ORAL | 0 refills | Status: DC
  Filled 2020-08-02: qty 30, 30d supply, fill #0

## 2020-08-02 MED ORDER — NOREL AD 4-10-325 MG PO TABS
1.0000 | ORAL_TABLET | Freq: Three times a day (TID) | ORAL | 0 refills | Status: DC | PRN
  Filled 2020-08-02: qty 90, 30d supply, fill #0

## 2020-08-02 MED ORDER — FLUTICASONE PROPIONATE 50 MCG/ACT NA SUSP
2.0000 | Freq: Every day | NASAL | 6 refills | Status: DC
  Filled 2020-08-02: qty 16, 30d supply, fill #0
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
## 2020-08-02 NOTE — Patient Instructions (Addendum)
Increase Prozac to 40 mg daily Notify office immediately of any adverse side effects Continue counseling as scheduled Continue follow-up with cardiology as scheduled Try Norel for seasonal allergies  Follow-up in 4-weeks   Quality Sleep Information, Adult Quality sleep is important for your mental and physical health. It also improves your quality of life. Quality sleep means you:  Are asleep for most of the time you are in bed.  Fall asleep within 30 minutes.  Wake up no more than once a night.  Are awake for no longer than 20 minutes if you do wake up during the night. Most adults need 7-8 hours of quality sleep each night. How can poor sleep affect me? If you do not get enough quality sleep, you may have:  Mood swings.  Daytime sleepiness.  Confusion.  Decreased reaction time.  Sleep disorders, such as insomnia and sleep apnea.  Difficulty with: ? Solving problems. ? Coping with stress. ? Paying attention. These issues may affect your performance and productivity at work, school, and at home. Lack of sleep may also put you at higher risk for accidents, suicide, and risky behaviors. If you do not get quality sleep you may also be at higher risk for several health problems, including:  Infections.  Type 2 diabetes.  Heart disease.  High blood pressure.  Obesity.  Worsening of long-term conditions, like arthritis, kidney disease, depression, Parkinson's disease, and epilepsy. What actions can I take to get more quality sleep?  Stick to a sleep schedule. Go to sleep and wake up at about the same time each day. Do not try to sleep less on weekdays and make up for lost sleep on weekends. This does not work.  Try to get about 30 minutes of exercise on most days. Do not exercise 2-3 hours before going to bed.  Limit naps during the day to 30 minutes or less.  Do not use any products that contain nicotine or tobacco, such as cigarettes or e-cigarettes. If you need  help quitting, ask your health care provider.  Do not drink caffeinated beverages for at least 8 hours before going to bed. Coffee, tea, and some sodas contain caffeine.  Do not drink alcohol close to bedtime.  Do not eat large meals close to bedtime.  Do not take naps in the late afternoon.  Try to get at least 30 minutes of sunlight every day. Morning sunlight is best.  Make time to relax before bed. Reading, listening to music, or taking a hot bath promotes quality sleep.  Make your bedroom a place that promotes quality sleep. Keep your bedroom dark, quiet, and at a comfortable room temperature. Make sure your bed is comfortable. Take out sleep distractions like TV, a computer, smartphone, and bright lights.  If you are lying awake in bed for longer than 20 minutes, get up and do a relaxing activity until you feel sleepy.  Work with your health care provider to treat medical conditions that may affect sleeping, such as: ? Nasal obstruction. ? Snoring. ? Sleep apnea and other sleep disorders.  Talk to your health care provider if you think any of your prescription medicines may cause you to have difficulty falling or staying asleep.  If you have sleep problems, talk with a sleep consultant. If you think you have a sleep disorder, talk with your health care provider about getting evaluated by a specialist.      Where to find more information  National Sleep Foundation website: https://sleepfoundation.org  National  Heart, Lung, and Blood Institute (NHLBI): https://hall.info/.pdf  Centers for Disease Control and Prevention (CDC): DetailSports.is Contact a health care provider if you:  Have trouble getting to sleep or staying asleep.  Often wake up very early in the morning and cannot get back to sleep.  Have daytime sleepiness.  Have daytime sleep attacks of suddenly falling asleep and sudden muscle weakness  (narcolepsy).  Have a tingling sensation in your legs with a strong urge to move your legs (restless legs syndrome).  Stop breathing briefly during sleep (sleep apnea).  Think you have a sleep disorder or are taking a medicine that is affecting your quality of sleep. Summary  Most adults need 7-8 hours of quality sleep each night.  Getting enough quality sleep is an important part of health and well-being.  Make your bedroom a place that promotes quality sleep and avoid things that may cause you to have poor sleep, such as alcohol, caffeine, smoking, and large meals.  Talk to your health care provider if you have trouble falling asleep or staying asleep. This information is not intended to replace advice given to you by your health care provider. Make sure you discuss any questions you have with your health care provider. Document Revised: 07/11/2017 Document Reviewed: 07/11/2017 Elsevier Patient Education  2021 Elsevier Inc.  http://APA.org/depression-guideline"> https://clinicalkey.com"> http://point-of-care.elsevierperformancemanager.com/skills/"> http://point-of-care.elsevierperformancemanager.com">  Managing Depression, Adult Depression is a mental health condition that affects your thoughts, feelings, and actions. Being diagnosed with depression can bring you relief if you did not know why you have felt or behaved a certain way. It could also leave you feeling overwhelmed with uncertainty about your future. Preparing yourself to manage your symptoms can help you feel more positive about your future. How to manage lifestyle changes Managing stress Stress is your body's reaction to life changes and events, both good and bad. Stress can add to your feelings of depression. Learning to manage your stress can help lessen your feelings of depression. Try some of the following approaches to reducing your stress (stress reduction techniques):  Listen to music that you enjoy and that  inspires you.  Try using a meditation app or take a meditation class.  Develop a practice that helps you connect with your spiritual self. Walk in nature, pray, or go to a place of worship.  Do some deep breathing. To do this, inhale slowly through your nose. Pause at the top of your inhale for a few seconds and then exhale slowly, letting your muscles relax.  Practice yoga to help relax and work your muscles. Choose a stress reduction technique that suits your lifestyle and personality. These techniques take time and practice to develop. Set aside 5-15 minutes a day to do them. Therapists can offer training in these techniques. Other things you can do to manage stress include:  Keeping a stress diary.  Knowing your limits and saying no when you think something is too much.  Paying attention to how you react to certain situations. You may not be able to control everything, but you can change your reaction.  Adding humor to your life by watching funny films or TV shows.  Making time for activities that you enjoy and that relax you.   Medicines Medicines, such as antidepressants, are often a part of treatment for depression.  Talk with your pharmacist or health care provider about all the medicines, supplements, and herbal products that you take, their possible side effects, and what medicines and other products are safe to take together.  Make sure to report any side effects you may have to your health care provider. Relationships Your health care provider may suggest family therapy, couples therapy, or individual therapy as part of your treatment. How to recognize changes Everyone responds differently to treatment for depression. As you recover from depression, you may start to:  Have more interest in doing activities.  Feel less hopeless.  Have more energy.  Overeat less often, or have a better appetite.  Have better mental focus. It is important to recognize if your  depression is not getting better or is getting worse. The symptoms you had in the beginning may return, such as:  Tiredness (fatigue) or low energy.  Eating too much or too little.  Sleeping too much or too little.  Feeling restless, agitated, or hopeless.  Trouble focusing or making decisions.  Unexplained physical complaints.  Feeling irritable, angry, or aggressive. If you or your family members notice these symptoms coming back, let your health care provider know right away. Follow these instructions at home: Activity  Try to get some form of exercise each day, such as walking, biking, swimming, or lifting weights.  Practice stress reduction techniques.  Engage your mind by taking a class or doing some volunteer work.   Lifestyle  Get the right amount and quality of sleep.  Cut down on using caffeine, tobacco, alcohol, and other potentially harmful substances.  Eat a healthy diet that includes plenty of vegetables, fruits, whole grains, low-fat dairy products, and lean protein. Do not eat a lot of foods that are high in solid fats, added sugars, or salt (sodium). General instructions  Take over-the-counter and prescription medicines only as told by your health care provider.  Keep all follow-up visits as told by your health care provider. This is important. Where to find support Talking to others Friends and family members can be sources of support and guidance. Talk to trusted friends or family members about your condition. Explain your symptoms to them, and let them know that you are working with a health care provider to treat your depression. Tell friends and family members how they also can be helpful.   Finances  Find appropriate mental health providers that fit with your financial situation.  Talk with your health care provider about options to get reduced prices on your medicines. Where to find more information You can find support in your area  from:  Anxiety and Depression Association of America (ADAA): www.adaa.org  Mental Health America: www.mentalhealthamerica.net  [employer] on Mental Illness: www.nami.org Contact a health care provider if:  You stop taking your antidepressant medicines, and you have any of these symptoms: ? Nausea. ? Headache. ? Light-headedness. ? Chills and body aches. ? Not being able to sleep (insomnia).  You or your friends and family think your depression is getting worse. Get help right away if:  You have thoughts of hurting yourself or others. If you ever feel like you may hurt yourself or others, or have thoughts about taking your own life, get help right away. Go to your nearest emergency department or:  Call your local emergency services (911 in the [place]  Call a suicide crisis helpline, such as the National Suicide Prevention Lifeline at 432-372-8674. This is open 24 hours a day in the [place]  Text the Crisis Text Line at (573)412-5468 (in the [place] Summary  If you are diagnosed with depression, preparing yourself to manage your symptoms is a good way to feel positive about your future.  Work with your health care provider on a management plan that includes stress reduction techniques, medicines (if applicable), therapy, and healthy lifestyle habits.  Keep talking with your health care provider about how your treatment is working.  If you have thoughts about taking your own life, call a suicide crisis helpline or text a crisis text line. This information is not intended to replace advice given to you by your health care provider. Make sure you discuss any questions you have with your health care provider. Document Revised: 02/12/2019 Document Reviewed: 02/12/2019 Elsevier Patient Education  2021 Elsevier Inc.  Managing Anxiety, Adult After being diagnosed with an anxiety disorder, you may be relieved to know why you have felt or behaved a certain way. You may also feel  overwhelmed about the treatment ahead and what it will mean for your life. With care and support, you can manage this condition and recover from it. How to manage lifestyle changes Managing stress and anxiety Stress is your body's reaction to life changes and events, both good and bad. Most stress will last just a few hours, but stress can be ongoing and can lead to more than just stress. Although stress can play a major role in anxiety, it is not the same as anxiety. Stress is usually caused by something external, such as a deadline, test, or competition. Stress normally passes after the triggering event has ended.  Anxiety is caused by something internal, such as imagining a terrible outcome or worrying that something will go wrong that will devastate you. Anxiety often does not go away even after the triggering event is over, and it can become long-term (chronic) worry. It is important to understand the differences between stress and anxiety and to manage your stress effectively so that it does not lead to an anxious response. Talk with your health care provider or a counselor to learn more about reducing anxiety and stress. He or she may suggest tension reduction techniques, such as:  Music therapy. This can include creating or listening to music that you enjoy and that inspires you.  Mindfulness-based meditation. This involves being aware of your normal breaths while not trying to control your breathing. It can be done while sitting or walking.  Centering prayer. This involves focusing on a word, phrase, or sacred image that means something to you and brings you peace.  Deep breathing. To do this, expand your stomach and inhale slowly through your nose. Hold your breath for 3-5 seconds. Then exhale slowly, letting your stomach muscles relax.  Self-talk. This involves identifying thought patterns that lead to anxiety reactions and changing those patterns.  Muscle relaxation. This involves  tensing muscles and then relaxing them. Choose a tension reduction technique that suits your lifestyle and personality. These techniques take time and practice. Set aside 5-15 minutes a day to do them. Therapists can offer counseling and training in these techniques. The training to help with anxiety may be covered by some insurance plans. Other things you can do to manage stress and anxiety include:  Keeping a stress/anxiety diary. This can help you learn what triggers your reaction and then learn ways to manage your response.  Thinking about how you react to certain situations. You may not be able to control everything, but you can control your response.  Making time for activities that help you relax and not feeling guilty about spending your time in this way.  Visual imagery and yoga can help you stay calm and relax.   Medicines  Medicines can help ease symptoms. Medicines for anxiety include:  Anti-anxiety drugs.  Antidepressants. Medicines are often used as a primary treatment for anxiety disorder. Medicines will be prescribed by a health care provider. When used together, medicines, psychotherapy, and tension reduction techniques may be the most effective treatment. Relationships Relationships can play a big part in helping you recover. Try to spend more time connecting with trusted friends and family members. Consider going to couples counseling, taking family education classes, or going to family therapy. Therapy can help you and others better understand your condition. How to recognize changes in your anxiety Everyone responds differently to treatment for anxiety. Recovery from anxiety happens when symptoms decrease and stop interfering with your daily activities at home or work. This may mean that you will start to:  Have better concentration and focus. Worry will interfere less in your daily thinking.  Sleep better.  Be less irritable.  Have more energy.  Have improved  memory. It is important to recognize when your condition is getting worse. Contact your health care provider if your symptoms interfere with home or work and you feel like your condition is not improving. Follow these instructions at home: Activity  Exercise. Most adults should do the following: ? Exercise for at least 150 minutes each week. The exercise should increase your heart rate and make you sweat (moderate-intensity exercise). ? Strengthening exercises at least twice a week.  Get the right amount and quality of sleep. Most adults need 7-9 hours of sleep each night. Lifestyle  Eat a healthy diet that includes plenty of vegetables, fruits, whole grains, low-fat dairy products, and lean protein. Do not eat a lot of foods that are high in solid fats, added sugars, or salt.  Make choices that simplify your life.  Do not use any products that contain nicotine or tobacco, such as cigarettes, e-cigarettes, and chewing tobacco. If you need help quitting, ask your health care provider.  Avoid caffeine, alcohol, and certain over-the-counter cold medicines. These may make you feel worse. Ask your pharmacist which medicines to avoid.   General instructions  Take over-the-counter and prescription medicines only as told by your health care provider.  Keep all follow-up visits as told by your health care provider. This is important. Where to find support You can get help and support from these sources:  Self-help groups.  Online and [employer].  A trusted spiritual leader.  Couples counseling.  Family education classes.  Family therapy. Where to find more information You may find that joining a support group helps you deal with your anxiety. The following sources can help you locate counselors or support groups near you:  Mental Health America: www.mentalhealthamerica.net  Anxiety and Depression Association of [place] (ADAA): ProgramCam.de  [employer] on Mental  Illness (NAMI): www.nami.org Contact a health care provider if you:  Have a hard time staying focused or finishing daily tasks.  Spend many hours a day feeling worried about everyday life.  Become exhausted by worry.  Start to have headaches, feel tense, or have nausea.  Urinate more than normal.  Have diarrhea. Get help right away if you have:  A racing heart and shortness of breath.  Thoughts of hurting yourself or others. If you ever feel like you may hurt yourself or others, or have thoughts about taking your own life, get help right away. You can go to your nearest emergency department or call:  Your local emergency services (911 in the [place]  A suicide crisis helpline,  such as the National Suicide Prevention Lifeline at 423-686-1225. This is open 24 hours a day. Summary  Taking steps to learn and use tension reduction techniques can help calm you and help prevent triggering an anxiety reaction.  When used together, medicines, psychotherapy, and tension reduction techniques may be the most effective treatment.  Family, friends, and partners can play a big part in helping you recover from an anxiety disorder. This information is not intended to replace advice given to you by your health care provider. Make sure you discuss any questions you have with your health care provider. Document Revised: 09/03/2018 Document Reviewed: 09/03/2018 Elsevier Patient Education  2021 [employer].

## 2020-08-03 ENCOUNTER — Encounter: Payer: No Typology Code available for payment source | Admitting: Physical Therapy

## 2020-08-04 ENCOUNTER — Ambulatory Visit: Payer: No Typology Code available for payment source | Admitting: Physical Therapy

## 2020-08-04 ENCOUNTER — Other Ambulatory Visit: Payer: Self-pay

## 2020-08-04 ENCOUNTER — Encounter: Payer: Self-pay | Admitting: Physical Therapy

## 2020-08-04 DIAGNOSIS — M542 Cervicalgia: Secondary | ICD-10-CM | POA: Diagnosis not present

## 2020-08-04 DIAGNOSIS — M25512 Pain in left shoulder: Secondary | ICD-10-CM

## 2020-08-04 DIAGNOSIS — R293 Abnormal posture: Secondary | ICD-10-CM

## 2020-08-04 DIAGNOSIS — M25622 Stiffness of left elbow, not elsewhere classified: Secondary | ICD-10-CM

## 2020-08-04 DIAGNOSIS — M6281 Muscle weakness (generalized): Secondary | ICD-10-CM

## 2020-08-04 DIAGNOSIS — R252 Cramp and spasm: Secondary | ICD-10-CM

## 2020-08-05 ENCOUNTER — Encounter: Payer: Self-pay | Admitting: Physical Therapy

## 2020-08-05 ENCOUNTER — Encounter: Payer: No Typology Code available for payment source | Admitting: Physical Therapy
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
## 2020-08-05 NOTE — Addendum Note (Signed)
Addended by: Dessie Coma on: 08/05/2020 08:12 AM   Modules accepted: Orders

## 2020-08-11 ENCOUNTER — Ambulatory Visit: Payer: No Typology Code available for payment source | Admitting: Physical Therapy

## 2020-08-11 ENCOUNTER — Other Ambulatory Visit: Payer: Self-pay

## 2020-08-11 DIAGNOSIS — M542 Cervicalgia: Secondary | ICD-10-CM

## 2020-08-11 DIAGNOSIS — R252 Cramp and spasm: Secondary | ICD-10-CM

## 2020-08-11 DIAGNOSIS — M6281 Muscle weakness (generalized): Secondary | ICD-10-CM

## 2020-08-11 DIAGNOSIS — M25512 Pain in left shoulder: Secondary | ICD-10-CM
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
## 2020-08-23 ENCOUNTER — Telehealth: Payer: Self-pay

## 2020-08-23 NOTE — Telephone Encounter (Signed)
Spoke with patient regarding results and recommendation.  Patient verbalizes understanding and is agreeable to plan of care. Advised patient to call back with any issues or concerns.  

## 2020-08-23 NOTE — Telephone Encounter (Signed)
-----   Message from Baldo Daub, MD sent at 08/21/2020 12:59 PM EDT ----- Her monitor is normal, the episodes that she tracked her own heart rhythm.  Please ask her if she continues to take Norel AD as it may play a role in making her heart rhythm rapid which seem to correlate with her symptoms and if she is taking it I would advise her to stop.

## 2020-08-24 ENCOUNTER — Encounter: Payer: Self-pay | Admitting: Physical Therapy

## 2020-08-24 ENCOUNTER — Ambulatory Visit: Payer: No Typology Code available for payment source | Attending: Physician Assistant | Admitting: Physical Therapy

## 2020-08-25 ENCOUNTER — Ambulatory Visit (INDEPENDENT_AMBULATORY_CARE_PROVIDER_SITE_OTHER): Payer: No Typology Code available for payment source | Admitting: Cardiology

## 2020-08-25 ENCOUNTER — Other Ambulatory Visit (HOSPITAL_COMMUNITY): Payer: Self-pay

## 2020-08-25 ENCOUNTER — Encounter: Payer: Self-pay | Admitting: Cardiology

## 2020-08-25 ENCOUNTER — Other Ambulatory Visit: Payer: Self-pay

## 2020-08-25 VITALS — BP 102/54 | HR 79 | Ht 62.0 in | Wt 125.0 lb

## 2020-08-25 DIAGNOSIS — R002 Palpitations: Secondary | ICD-10-CM | POA: Diagnosis not present

## 2020-08-25 DIAGNOSIS — R011 Cardiac murmur, unspecified: Secondary | ICD-10-CM

## 2020-08-25 MED ORDER — ACEBUTOLOL HCL 200 MG PO CAPS
200.0000 mg | ORAL_CAPSULE | Freq: Every day | ORAL | 3 refills | Status: DC
  Filled 2020-08-25: qty 90, 90d supply, fill #0

## 2020-08-25 NOTE — Patient Instructions (Signed)
Medication Instructions:  Your physician has recommended you make the following change in your medication:  START: Acebutolol 200 mg take one tablet by mouth daily.   *If you need a refill on your cardiac medications before your next appointment, please call your pharmacy*   Lab Work: None If you have labs (blood work) drawn today and your tests are completely normal, you will receive your results only by: Marland Kitchen MyChart Message (if you have MyChart) OR . A paper copy in the mail If you have any lab test that is abnormal or we need to change your treatment, we will call you to review the results.   Testing/Procedures: None   Follow-Up: At [facility], you and your health needs are our priority.  As part of our continuing mission to provide you with exceptional heart care, we have created designated Provider Care Teams.  These Care Teams include your primary Cardiologist (physician) and Advanced Practice Providers (APPs -  Physician Assistants and Nurse Practitioners) who all work together to provide you with the care you need, when you need it.  We recommend signing up for the patient portal called "MyChart".  Sign up information is provided on this After Visit Summary.  MyChart is used to connect with patients for Virtual Visits (Telemedicine).  Patients are able to view lab/test results, encounter notes, upcoming appointments, etc.  Non-urgent messages can be sent to your provider as well.   To learn more about what you can do with MyChart, go to [email or web address].    Your next appointment:   3 month(s)  The format for your next appointment:   In Person  Provider:   Norman Herrlich, MD   Other Instructions

## 2020-08-25 NOTE — Progress Notes (Signed)
Cardiology Office Note:    Date:  08/25/2020   ID:  Brenda Mcmahon, DOB 1990-09-13, MRN [id number]  PCP:  Blane Ohara, MD  Cardiologist:  Norman Herrlich, MD    Referring MD: Blane Ohara, MD    ASSESSMENT:    1. Palpitations   2. Heart murmur    PLAN:    In order of problems listed above:  1. I am unsure whether this is symptomatic sinus tachycardia or whether we are missing atrial arrhythmia we will place her on a low-dose of beta-blocker for symptomatic relief encouraged her to purchase a wireless receiver for her smart phone to capture episodes and send it through MyChart. 2. Stable structurally normal heart on echo   Next appointment: 3 months   Medication Adjustments/Labs and Tests Ordered: Current medicines are reviewed at length with the patient today.  Concerns regarding medicines are outlined above.  No orders of the defined types were placed in this encounter.  Meds ordered this encounter  Medications  . acebutolol (SECTRAL) 200 MG capsule    Sig: Take 1 capsule (200 mg total) by mouth daily.    Dispense:  90 capsule    Refill:  3    Chief Complaint  Patient presents with  . Palpitations    History of Present Illness:    Brenda Mcmahon is a 30 y.o. female with a hx of heart murmur palpitation last seen 07/26/2020. Compliance with diet, lifestyle and medications: Yes Past Medical History:  Diagnosis Date  . Palpitation     Following the visit she underwent an event monitor for 14 days which showed no concerning arrhythmia her symptomatic events were associated with sinus rhythm with sinus tachycardia.  Laboratory studies showed normal hemoglobin 12.6 CMP was normal potassium 3.8 magnesium 2.1 and thyroid panel TSH free T3 free T4 were all normal.  Echocardiogram showed hyperdynamic left ventricular function with normal GLS and mitral valve was normal  She continues to be symptomatic and does not think we caught her event she shows me a picture  of a pulse meter with a heart rate of 139 bpm at rest. For further evaluation I encouraged her to buy the iPhone wireless receiver to record episodes can send him through MyChart After discussion of options were going to put her on a low-dose of a selective beta-blocker to medicate her symptoms. Past Surgical History:  Procedure Laterality Date  . FOOT FRACTURE SURGERY Right 2015   also ankle fracture repaired.   . WRIST FRACTURE SURGERY Left    30 yo.  fell through glass door.     Current Medications: Current Meds  Medication Sig  . acebutolol (SECTRAL) 200 MG capsule Take 1 capsule (200 mg total) by mouth daily.  Marland Kitchen FLUoxetine (PROZAC) 40 MG capsule Take 1 capsule (40 mg total) by mouth daily.  . fluticasone (FLONASE) 50 MCG/ACT nasal spray Place 2 sprays into both nostrils daily.  Marland Kitchen LORazepam (ATIVAN) 0.5 MG tablet TAKE 1 TABLET (0.5 MG TOTAL) BY MOUTH 2 (TWO) TIMES DAILY AS NEEDED FOR ANXIETY.  . medroxyPROGESTERone (DEPO-PROVERA) 150 MG/ML injection Inject into the muscle.     Allergies:   Hydrocodone, Kale, Oxycodone, Lactose intolerance (gi), and Hydrocodone-acetaminophen   Social History   Socioeconomic History  . Marital status: Single    Spouse name: Not on file  . Number of children: Not on file  . Years of education: Not on file  . Highest education level: Not on file  Occupational History  . Occupation:  pharmacy technician    Employer: [employer]  Tobacco Use  . Smoking status: Never Smoker  . Smokeless tobacco: Never Used  Substance and Sexual Activity  . Alcohol use: No  . Drug use: Never  . Sexual activity: Not on file  Other Topics Concern  . Not on file  Social History Narrative  . Not on file   Social Determinants of Health   Financial Resource Strain: Not on file  Food Insecurity: Not on file  Transportation Needs: Not on file  Physical Activity: Not on file  Stress: Not on file  Social Connections: Not on file     Family History: The  patient's family history includes Asthma in her brother; Hypotension in her mother; Seizures in her mother. ROS:   Please see the history of present illness.    All other systems reviewed and are negative.  EKGs/Labs/Other Studies Reviewed:    The following studies were reviewed today:  Recent Labs: 07/26/2020: ALT 8; BUN 8; Creatinine, Ser 0.89; Hemoglobin 12.6; Magnesium 2.1; Platelets 216; Potassium 3.8; Sodium 138; TSH 1.030  Recent Lipid Panel No results found for: CHOL, TRIG, HDL, CHOLHDL, VLDL, LDLCALC, LDLDIRECT  Physical Exam:    VS:  BP (!) 102/54 (BP Location: Right Arm, Patient Position: Sitting, Cuff Size: Normal)   Pulse 79   Ht 5\' 2"  (1.575 m)   Wt 125 lb (56.7 kg)   SpO2 97%   BMI 22.86 kg/m     Wt Readings from Last 3 Encounters:  08/25/20 125 lb (56.7 kg)  08/02/20 124 lb (56.2 kg)  07/26/20 124 lb 1.3 oz (56.3 kg)     GEN:  Well nourished, well developed in no acute distress HEENT: Normal NECK: No JVD; No carotid bruits LYMPHATICS: No lymphadenopathy CARDIAC: RRR, no murmurs, rubs, gallops RESPIRATORY:  Clear to auscultation without rales, wheezing or rhonchi  ABDOMEN: Soft, non-tender, non-distended MUSCULOSKELETAL:  No edema; No deformity  SKIN: Warm and dry NEUROLOGIC:  Alert and oriented x 3 PSYCHIATRIC:  Normal affect    Signed, 09/25/20, MD  08/25/2020 12:04 PM    [facility]

## 2020-08-30 ENCOUNTER — Ambulatory Visit (INDEPENDENT_AMBULATORY_CARE_PROVIDER_SITE_OTHER): Payer: No Typology Code available for payment source | Admitting: Family Medicine

## 2020-08-30 ENCOUNTER — Other Ambulatory Visit: Payer: Self-pay

## 2020-08-30 ENCOUNTER — Other Ambulatory Visit (HOSPITAL_COMMUNITY): Payer: Self-pay

## 2020-08-30 VITALS — BP 122/54 | HR 84 | Temp 97.7°F | Resp 16 | Ht 62.0 in | Wt 125.0 lb

## 2020-08-30 DIAGNOSIS — R5383 Other fatigue: Secondary | ICD-10-CM | POA: Diagnosis not present

## 2020-08-30 DIAGNOSIS — F5101 Primary insomnia: Secondary | ICD-10-CM

## 2020-08-30 DIAGNOSIS — R002 Palpitations: Secondary | ICD-10-CM | POA: Diagnosis not present

## 2020-08-30 DIAGNOSIS — F331 Major depressive disorder, recurrent, moderate: Secondary | ICD-10-CM

## 2020-08-30 MED ORDER — TRAZODONE HCL 50 MG PO TABS
50.0000 mg | ORAL_TABLET | Freq: Every evening | ORAL | 0 refills | Status: DC | PRN
  Filled 2020-08-30: qty 30, 30d supply, fill #0

## 2020-08-30 MED ORDER — FLUOXETINE HCL 20 MG PO CAPS
20.0000 mg | ORAL_CAPSULE | Freq: Every day | ORAL | 0 refills | Status: DC
  Filled 2020-08-30: qty 90, 90d supply, fill #0

## 2020-08-30 MED ORDER — LAMOTRIGINE 25 MG PO TABS
ORAL_TABLET | ORAL | 0 refills | Status: DC
  Filled 2020-08-30: qty 77, 28d supply, fill #0

## 2020-08-30 NOTE — Patient Instructions (Signed)
continue prozac to 20 mg once daily Start on lamictal and take as directed.  Start trazodone 50 mg once at night.  Continue counseling.

## 2020-08-30 NOTE — Progress Notes (Signed)
Subjective:  Patient ID: Brenda Mcmahon, female    DOB: 1991/02/23  Age: 30 y.o. MRN: [id number]  Chief Complaint  Patient presents with  . Palpitations  . Fatigue  . Insomnia    HPI Patient states she called out of work last week due to feeling fatigue. Stayed in bed all day Saturday and Sunday. Neither Friday night nor last night was she able to sleep, recently has not had any palpitations. Has had a headache since Wednesday. Insomnia: can go to sleep, but not stay asleep.   Moderate depression: Talks to counselor once weekly.  ON prozac. 40 mg once daily.  Not crying a lot. Irritable/angry. Patient is coaching track. She got very frustrated with her track team, which is unlike her.   Current Outpatient Medications on File Prior to Visit  Medication Sig Dispense Refill  . acebutolol (SECTRAL) 200 MG capsule Take 1 capsule (200 mg total) by mouth daily. 90 capsule 3  . fluticasone (FLONASE) 50 MCG/ACT nasal spray Place 2 sprays into both nostrils daily. 16 g 6  . LORazepam (ATIVAN) 0.5 MG tablet TAKE 1 TABLET (0.5 MG TOTAL) BY MOUTH 2 (TWO) TIMES DAILY AS NEEDED FOR ANXIETY. 30 tablet 1  . medroxyPROGESTERone (DEPO-PROVERA) 150 MG/ML injection Inject into the muscle.     No current facility-administered medications on file prior to visit.   Past Medical History:  Diagnosis Date  . Palpitation    Past Surgical History:  Procedure Laterality Date  . FOOT FRACTURE SURGERY Right 2015   also ankle fracture repaired.   . WRIST FRACTURE SURGERY Left    30 yo.  fell through glass door.     Family History  Problem Relation Age of Onset  . Seizures Mother   . Hypotension Mother   . Asthma Brother    Social History   Socioeconomic History  . Marital status: Single    Spouse name: Not on file  . Number of children: Not on file  . Years of education: Not on file  . Highest education level: Not on file  Occupational History  . Occupation: Pharmacologist     Employer: [employer]  Tobacco Use  . Smoking status: Never Smoker  . Smokeless tobacco: Never Used  Substance and Sexual Activity  . Alcohol use: No  . Drug use: Never  . Sexual activity: Not on file  Other Topics Concern  . Not on file  Social History Narrative  . Not on file   Social Determinants of Health   Financial Resource Strain: Not on file  Food Insecurity: Not on file  Transportation Needs: Not on file  Physical Activity: Not on file  Stress: Not on file  Social Connections: Not on file    Review of Systems  Constitutional: Negative for chills, fatigue and fever.  HENT: Negative for congestion, ear pain, rhinorrhea and sore throat.   Respiratory: Negative for cough and shortness of breath.   Cardiovascular: Negative for chest pain.  Gastrointestinal: Negative for abdominal pain, constipation, diarrhea, nausea and vomiting.  Genitourinary: Negative for dysuria and urgency.  Musculoskeletal: Negative for back pain and myalgias.  Neurological: Negative for dizziness, weakness, light-headedness and headaches.  Psychiatric/Behavioral: Positive for dysphoric mood. The patient is nervous/anxious.      Objective:  BP (!) 122/54   Pulse 84   Temp 97.7 F (36.5 C)   Resp 16   Ht 5\' 2"  (1.575 m)   Wt 125 lb (56.7 kg)   BMI 22.86 kg/m  BP/Weight 08/30/2020 08/25/2020 08/02/2020  Systolic BP 122 102 124  Diastolic BP 54 54 74  Wt. (Lbs) 125 125 124  BMI 22.86 22.86 22.68    Physical Exam Vitals reviewed.  Constitutional:      Appearance: Normal appearance. She is normal weight.  Cardiovascular:     Rate and Rhythm: Normal rate and regular rhythm.     Heart sounds: Normal heart sounds.  Pulmonary:     Effort: Pulmonary effort is normal. No respiratory distress.     Breath sounds: Normal breath sounds.  Abdominal:     General: Abdomen is flat.  Musculoskeletal:     Comments: Left shoulder ROM has improved.   Neurological:     Mental Status: She is alert  and oriented to person, place, and time.  Psychiatric:        Behavior: Behavior normal.     Comments: irritable     Diabetic Foot Exam - Simple   No data filed      Lab Results  Component Value Date   WBC 4.6 07/26/2020   HGB 12.6 07/26/2020   HCT 38.9 07/26/2020   PLT 216 07/26/2020   GLUCOSE 87 07/26/2020   ALT 8 07/26/2020   AST 11 07/26/2020   NA 138 07/26/2020   K 3.8 07/26/2020   CL 103 07/26/2020   CREATININE 0.89 07/26/2020   BUN 8 07/26/2020   CO2 19 (L) 07/26/2020   TSH 1.030 07/26/2020      Assessment & Plan:   1. Palpitations 2. Moderate recurrent major depression (HCC 3. Other fatigue 4. Primary insomnia  PLAN: Not at goal continue prozac to 20 mg once daily Start on lamictal and take as directed.  Start trazodone 50 mg once at night.  Continue counseling.   Meds ordered this encounter  Medications  . FLUoxetine (PROZAC) 20 MG capsule    Sig: Take 1 capsule (20 mg total) by mouth daily.    Dispense:  90 capsule    Refill:  0  . lamoTRIgine (LAMICTAL) 25 MG tablet    Sig: Take 1 tablet (25 mg total) by mouth daily for 7 days, THEN 2 tablets (50 mg total) daily for 7 days, THEN 4 tablets (100 mg total) daily for 14 days.    Dispense:  77 tablet    Refill:  0  . traZODone (DESYREL) 50 MG tablet    Sig: Take 1 tablet (50 mg total) by mouth at bedtime as needed for sleep.    Dispense:  30 tablet    Refill:  0      Follow-up: Return in about 23 days (around 09/22/2020).  An After Visit Summary was printed and given to the patient.  Blane Ohara, MD [facility] [phone]

## 2020-09-09 ENCOUNTER — Encounter: Payer: Self-pay | Admitting: Family Medicine

## 2020-09-23 ENCOUNTER — Ambulatory Visit: Payer: No Typology Code available for payment source | Admitting: Family Medicine

## 2020-10-15 ENCOUNTER — Encounter: Payer: Self-pay | Admitting: Family Medicine

## 2020-10-15 ENCOUNTER — Other Ambulatory Visit: Payer: Self-pay | Admitting: Family Medicine

## 2020-10-15 ENCOUNTER — Other Ambulatory Visit (HOSPITAL_COMMUNITY): Payer: Self-pay

## 2020-10-15 MED ORDER — TRIAMCINOLONE ACETONIDE 0.1 % EX CREA
1.0000 "application " | TOPICAL_CREAM | Freq: Two times a day (BID) | CUTANEOUS | 0 refills | Status: DC
  Filled 2020-10-15: qty 80, 30d supply, fill #0

## 2020-10-21 ENCOUNTER — Ambulatory Visit: Payer: No Typology Code available for payment source | Admitting: Family Medicine

## 2020-10-25 ENCOUNTER — Other Ambulatory Visit (HOSPITAL_COMMUNITY): Payer: Self-pay

## 2020-11-10 DIAGNOSIS — R002 Palpitations: Secondary | ICD-10-CM | POA: Insufficient documentation

## 2020-11-11 MED FILL — Lorazepam Tab 0.5 MG: ORAL | 15 days supply | Qty: 30 | Fill #0 | Status: AC

## 2020-11-12 ENCOUNTER — Other Ambulatory Visit (HOSPITAL_COMMUNITY): Payer: Self-pay

## 2020-11-25 ENCOUNTER — Ambulatory Visit (INDEPENDENT_AMBULATORY_CARE_PROVIDER_SITE_OTHER): Payer: No Typology Code available for payment source | Admitting: Cardiology

## 2020-11-25 ENCOUNTER — Encounter: Payer: Self-pay | Admitting: Cardiology

## 2020-11-25 ENCOUNTER — Other Ambulatory Visit: Payer: Self-pay

## 2020-11-25 ENCOUNTER — Other Ambulatory Visit (HOSPITAL_COMMUNITY): Payer: Self-pay

## 2020-11-25 VITALS — BP 120/73 | HR 82 | Ht 62.0 in | Wt 134.1 lb

## 2020-11-25 DIAGNOSIS — R002 Palpitations: Secondary | ICD-10-CM

## 2020-11-25 MED ORDER — ACEBUTOLOL HCL 200 MG PO CAPS
200.0000 mg | ORAL_CAPSULE | Freq: Every day | ORAL | 3 refills | Status: DC
  Filled 2020-11-25: qty 90, 90d supply, fill #0
  Filled 2021-05-24: qty 90, 90d supply, fill #1
  Filled 2021-09-20: qty 90, 90d supply, fill #2

## 2020-11-25 NOTE — Progress Notes (Signed)
Cardiology Office Note:    Date:  11/25/2020   ID:  Brenda Mcmahon, DOB 01/17/1991, MRN [id number]  PCP:  Blane Ohara, MD  Cardiologist:  Norman Herrlich, MD    Referring MD: Blane Ohara, MD    ASSESSMENT:    1. 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  Encouraged her to get regular activity exercise which is beneficial.   Next appointment: 1 year   Medication Adjustments/Labs and Tests Ordered: Current medicines are reviewed at length with the patient today.  Concerns regarding medicines are outlined above.  No orders of the defined types were placed in this encounter.  No orders of the defined types were placed in this encounter.   Chief Complaint  Patient presents with   Palpitations    History of Present Illness:    Brenda Mcmahon is a 30 y.o. female with a hx of palpitation last seen 08/25/2020.  Despite utilizing a ambulatory event monitor we are unable to capture episodes I encouraged her to purchase the mobile cardia to continue to screen her heart rhythm. Compliance with diet, lifestyle and medications: Yes  Alleviate symptoms we decided to place her on low-dose selective beta-blocker and was worked very well at times if she misses a dose she has breakthrough episodes of rapid heart rate.  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 No chest pain edema shortness of breath. Past Medical History:  Diagnosis Date   Palpitation     Past Surgical History:  Procedure Laterality Date   FOOT FRACTURE SURGERY Right 2015   also ankle fracture repaired.    WRIST FRACTURE SURGERY Left    30 yo.  fell through glass door.     Current Medications: Current Meds  Medication Sig    acebutolol (SECTRAL) 200 MG capsule Take 1 capsule (200 mg total) by mouth daily.   LORazepam (ATIVAN) 0.5 MG tablet TAKE 1 TABLET (0.5 MG TOTAL) BY MOUTH 2 (TWO) TIMES DAILY AS NEEDED FOR ANXIETY.   medroxyPROGESTERone (DEPO-PROVERA) 150 MG/ML injection Inject into the muscle.   traZODone (DESYREL) 50 MG tablet Take 1 tablet (50 mg total) by mouth at bedtime as needed for sleep.   triamcinolone cream (KENALOG) 0.1 % Apply 1 application topically 2 (two) times daily.     Allergies:   Hydrocodone, Kale, Oxycodone, Lactose intolerance (gi), and Hydrocodone-acetaminophen   Social History   Socioeconomic History   Marital status: Single    Spouse name: Not on file   Number of children: Not on file   Years of education: Not on file   Highest education level: Not on file  Occupational History   Occupation: Pharmacologist    Employer: [employer]  Tobacco Use   Smoking status: Never   Smokeless tobacco: Never  Substance and Sexual Activity   Alcohol use: No   Drug use: Never   Sexual activity: Not on file  Other Topics Concern   Not on file  Social History Narrative   Not on file   Social Determinants of Health   Financial Resource Strain: Not on file  Food Insecurity: Not on file  Transportation Needs: Not on file  Physical Activity: Not on file  Stress: Not on  file  Social Connections: Not on file     Family History: The patient's family history includes Asthma in her brother; Hypotension in her mother; Seizures in her mother. ROS:   Please see the history of present illness.    All other systems reviewed and are negative.  EKGs/Labs/Other Studies Reviewed:    The following studies were reviewed today:    Recent Labs: 07/26/2020: ALT 8; BUN 8; Creatinine, Ser 0.89; Hemoglobin 12.6; Magnesium 2.1; Platelets 216; Potassium 3.8; Sodium 138; TSH 1.030  Recent Lipid Panel No results found for: CHOL, TRIG, HDL, CHOLHDL, VLDL, LDLCALC, LDLDIRECT  Physical Exam:     VS:  BP 120/73 (BP Location: Left Arm, Patient Position: Sitting, Cuff Size: Normal)   Pulse 82   Ht 5\' 2"  (1.575 m)   Wt 134 lb 1.3 oz (60.8 kg)   SpO2 99%   BMI 24.52 kg/m     Wt Readings from Last 3 Encounters:  11/25/20 134 lb 1.3 oz (60.8 kg)  08/30/20 125 lb (56.7 kg)  08/25/20 125 lb (56.7 kg)     GEN:  Well nourished, well developed in no acute distress HEENT: Normal NECK: No JVD; No carotid bruits LYMPHATICS: No lymphadenopathy CARDIAC: RRR, no murmurs, rubs, gallops RESPIRATORY:  Clear to auscultation without rales, wheezing or rhonchi  ABDOMEN: Soft, non-tender, non-distended MUSCULOSKELETAL:  No edema; No deformity  SKIN: Warm and dry NEUROLOGIC:  Alert and oriented x 3 PSYCHIATRIC:  Normal affect    Signed, 10/25/20, MD  11/25/2020 4:08 PM    [facility]

## 2020-11-25 NOTE — Patient Instructions (Addendum)

## 2020-11-25 NOTE — Addendum Note (Signed)
Addended by: Reynolds Bowl on: 11/25/2020 04:25 PM   Modules accepted: Orders

## 2021-03-22 ENCOUNTER — Other Ambulatory Visit: Payer: Self-pay

## 2021-03-22 ENCOUNTER — Ambulatory Visit (HOSPITAL_COMMUNITY)
Admission: EM | Admit: 2021-03-22 | Discharge: 2021-03-22 | Disposition: A | Discharge status: Home / Self Care | Payer: No Typology Code available for payment source | Attending: Urgent Care | Admitting: Urgent Care

## 2021-03-22 ENCOUNTER — Encounter (HOSPITAL_COMMUNITY): Payer: Self-pay | Admitting: Emergency Medicine

## 2021-03-22 DIAGNOSIS — B349 Viral infection, unspecified: Secondary | ICD-10-CM

## 2021-03-22 DIAGNOSIS — R52 Pain, unspecified: Secondary | ICD-10-CM

## 2021-03-22 DIAGNOSIS — U071 COVID-19: Secondary | ICD-10-CM | POA: Insufficient documentation

## 2021-03-22 DIAGNOSIS — R052 Subacute cough: Secondary | ICD-10-CM

## 2021-03-22 DIAGNOSIS — R059 Cough, unspecified: Secondary | ICD-10-CM | POA: Diagnosis present

## 2021-03-22 LAB — RESPIRATORY PANEL BY PCR

## 2021-03-22 NOTE — ED Provider Notes (Signed)
[facility]   MRN: [id number] DOB: [birth year]  Subjective:   Brenda Mcmahon is a 30 y.o. female presenting for 5-day history of acute onset coughing, body aches, drainage.  Patient has also had chest pain, shortness of breath and wheezing.  Works over at the hospital.  She is agreeable to testing for COVID and influenza.  Denies history of respiratory disorders.  Patient is non-smoker.  No current facility-administered medications for this encounter.  Current Outpatient Medications:    acebutolol (SECTRAL) 200 MG capsule, Take 1 capsule (200 mg total) by mouth daily., Disp: 90 capsule, Rfl: 3   fluticasone (FLONASE) 50 MCG/ACT nasal spray, Place 2 sprays into both nostrils daily. (Patient taking differently: Place 2 sprays into both nostrils daily as needed for allergies or rhinitis.), Disp: 16 g, Rfl: 6   medroxyPROGESTERone (DEPO-PROVERA) 150 MG/ML injection, Inject into the muscle., Disp: , Rfl:    traZODone (DESYREL) 50 MG tablet, Take 1 tablet (50 mg total) by mouth at bedtime as needed for sleep., Disp: 30 tablet, Rfl: 0   triamcinolone cream (KENALOG) 0.1 %, Apply 1 application topically 2 (two) times daily., Disp: 80 g, Rfl: 0   Allergies  Allergen Reactions   Hydrocodone Anaphylaxis   Kale Anaphylaxis    Hives   Oxycodone Anaphylaxis   Lactose Intolerance (Gi)    Hydrocodone-Acetaminophen Nausea And Vomiting    Past Medical History:  Diagnosis Date   Palpitation      Past Surgical History:  Procedure Laterality Date   FOOT FRACTURE SURGERY Right 2015   also ankle fracture repaired.    WRIST FRACTURE SURGERY Left    30 yo.  fell through glass door.     Family History  Problem Relation Age of Onset   Seizures Mother    Hypotension Mother    Asthma Brother     Social History   Tobacco Use   Smoking status: Never   Smokeless tobacco: Never  Substance Use Topics   Alcohol use: No   Drug use: Never    ROS   Objective:    Vitals: BP 123/77 (BP Location: Left Arm)   Pulse 93   Temp 98.4 F (36.9 C) (Oral)   Resp 16   SpO2 97%   Physical Exam Constitutional:      General: She is not in acute distress.    Appearance: Normal appearance. She is well-developed. She is not ill-appearing, toxic-appearing or diaphoretic.  HENT:     Head: Normocephalic and atraumatic.     Nose: Nose normal.     Mouth/Throat:     Mouth: Mucous membranes are moist.     Pharynx: Oropharynx is clear. No oropharyngeal exudate or posterior oropharyngeal erythema.  Eyes:     Extraocular Movements: Extraocular movements intact.     Pupils: Pupils are equal, round, and reactive to light.  Cardiovascular:     Rate and Rhythm: Normal rate and regular rhythm.     Pulses: Normal pulses.     Heart sounds: Normal heart sounds. No murmur heard.   No friction rub. No gallop.  Pulmonary:     Effort: Pulmonary effort is normal. No respiratory distress.     Breath sounds: Normal breath sounds. No stridor. No wheezing, rhonchi or rales.  Skin:    General: Skin is warm and dry.     Findings: No rash.  Neurological:     Mental Status: She is alert and oriented to person, place, and time.  Psychiatric:  Mood and Affect: Mood normal.        Behavior: Behavior normal.        Thought Content: Thought content normal.    Assessment and Plan :   PDMP not reviewed this encounter.  1. Acute viral syndrome   2. Subacute cough   3. Body aches    Deferred imaging given clear cardiopulmonary exam, hemodynamically stable vital signs. COVID and flu test pending.  We will otherwise manage for an acute viral syndrome.  Physical exam findings reassuring and vital signs stable for discharge. Advised supportive care, offered symptomatic relief. Counseled patient on potential for adverse effects with medications prescribed/recommended today, ER and return-to-clinic precautions discussed, patient verbalized understanding.       Wallis Bamberg,  [place] 03/22/21 [phone]

## 2021-03-22 NOTE — ED Triage Notes (Signed)
Pt presents with cough, headaches, body aches and chills Xs 4-5 days.

## 2021-03-22 NOTE — Discharge Instructions (Addendum)
We will notify you of your test results as they arrive and may take between 48-72 hours.  I encourage you to sign up for MyChart if you have not already done so as this can be the easiest way for us to communicate results to you online or through a phone app.  Generally, we only contact you if it is a positive test result.  In the meantime, if you develop worsening symptoms including fever, chest pain, shortness of breath despite our current treatment plan then please report to the emergency room as this may be a sign of worsening status from possible viral infection. ° °Otherwise, we will manage this as a viral syndrome. For sore throat or cough try using a honey-based tea. Use 3 teaspoons of honey with juice squeezed from half lemon. Place shaved pieces of ginger into 1/2-1 cup of water and warm over stove top. Then mix the ingredients and repeat every 4 hours as needed. Please take Tylenol 500mg-650mg every 6 hours for aches and pains, fevers. Hydrate very well with at least 2 liters of water. Eat light meals such as soups to replenish electrolytes and soft fruits, veggies. Start an antihistamine like Zyrtec for postnasal drainage, sinus congestion.  You can take this together with pseudoephedrine (Sudafed) at a dose of 30 mg 2-3 times a day as needed for the same kind of congestion.  Use the cough medications as needed. ° °

## 2021-03-23 LAB — SARS CORONAVIRUS 2 (TAT 6-24 HRS): SARS Coronavirus 2: POSITIVE — AB

## 2021-05-24 ENCOUNTER — Other Ambulatory Visit (HOSPITAL_COMMUNITY): Payer: Self-pay

## 2021-06-21 IMAGING — MR MR SHOULDER*L* W/O CM
5 series · 35 of 40 positions shown · non-contrast
Comparison: Plain films left shoulder 05/03/2020.

CLINICAL DATA: Onset left shoulder pain radiating into the scapula
and back 04/08/2020. No known injury.

EXAM:
MRI OF THE LEFT SHOULDER WITHOUT CONTRAST
TECHNIQUE: Multiplanar, multisequence MR imaging of the shoulder was performed.
No intravenous contrast was administered.

[Series 3: T2 fat-sat · axial · 4.0mm · 0.55mm/px · z∈[-49,+70]mm · 8 of 26 slices shown (1 of 3)]
[im 1/26]
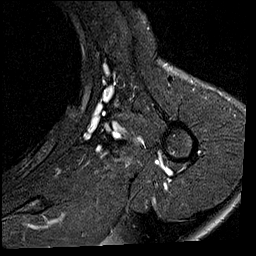
[im 3/26]
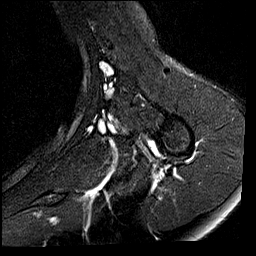
[im 9/26]
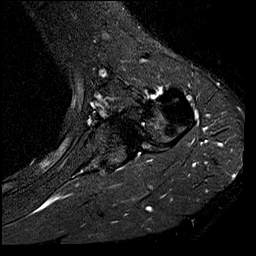
[im 12/26]
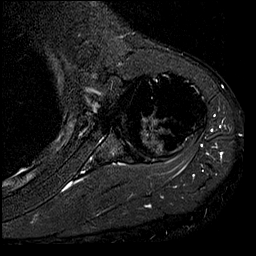
[im 14/26]
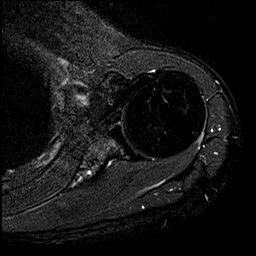
[im 17/26]
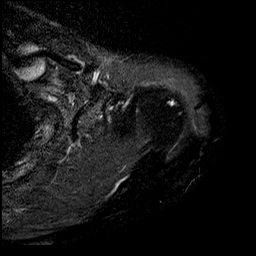
[im 23/26]
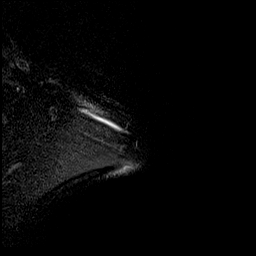
[im 26/26]
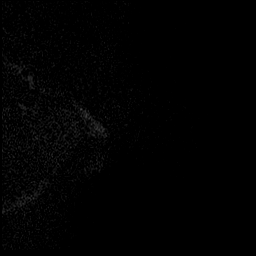

[Series 4: T1 · oblique · 4.0mm · 0.27mm/px · 6 of 23 slices shown]
[im 1/23]
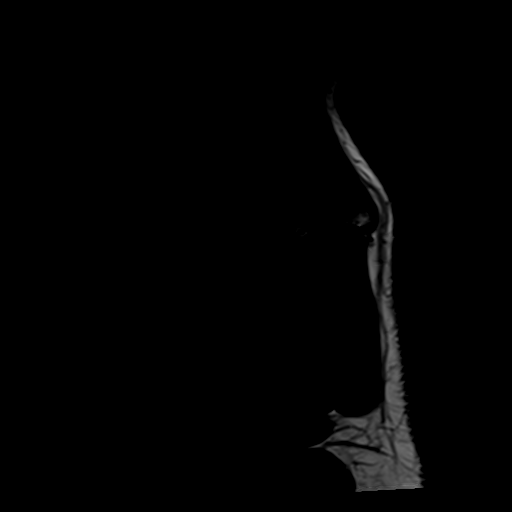
[im 3/23]
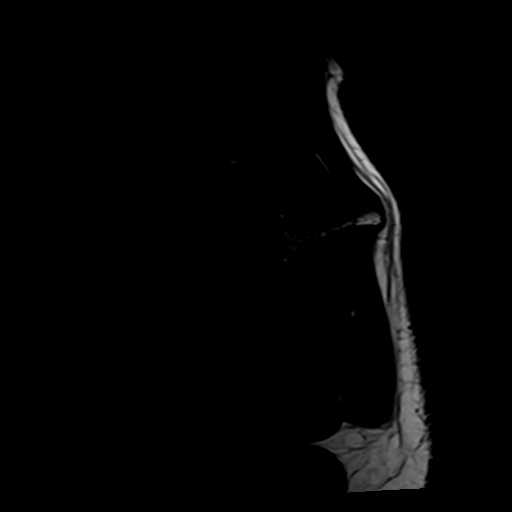
[im 6/23]
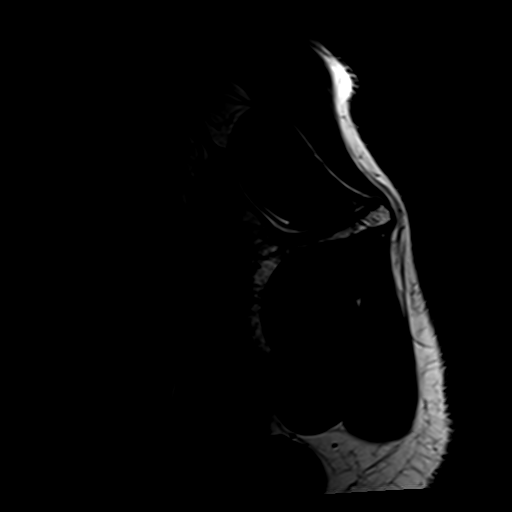
[im 9/23]
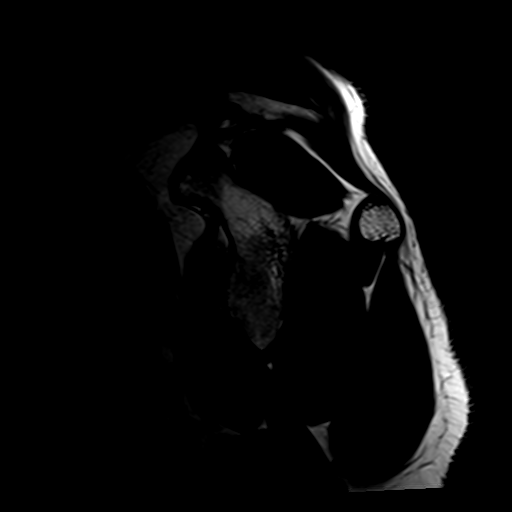
[im 14/23]
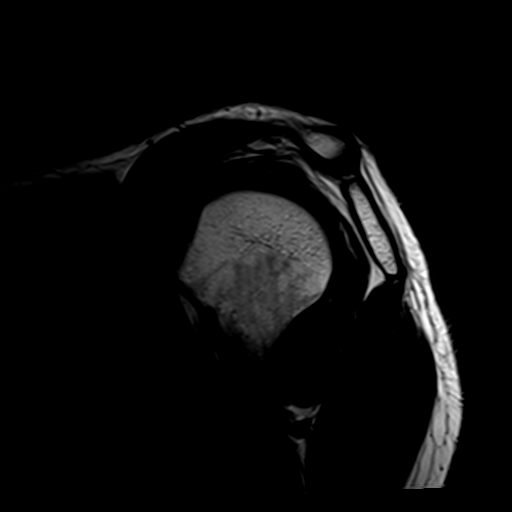
[im 17/23]
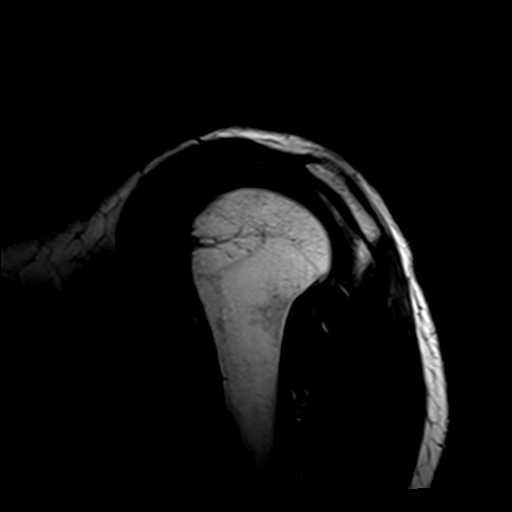

[Series 5: T2 fat-sat · oblique · 4.0mm · 0.55mm/px · 9 of 23 slices shown (2 of 3)]
[im 1/23]
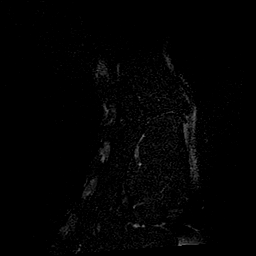
[im 3/23]
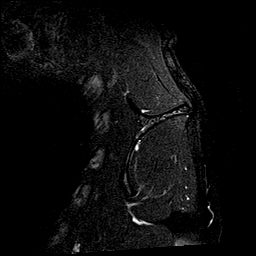
[im 6/23]
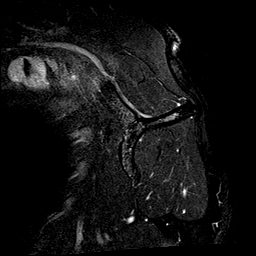
[im 9/23]
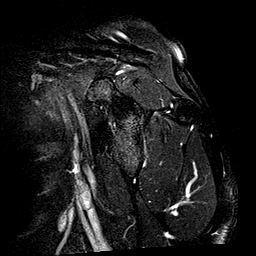
[im 12/23]
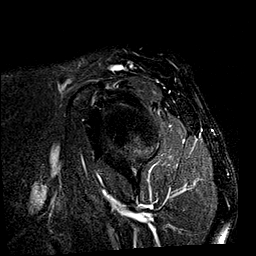
[im 14/23]
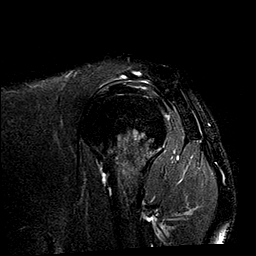
[im 17/23]
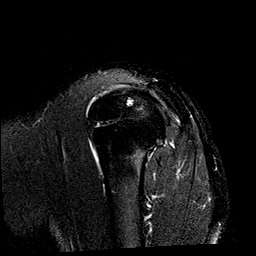
[im 20/23]
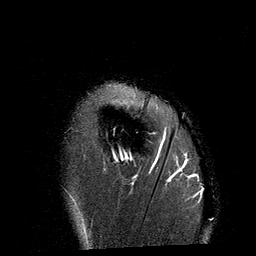
[im 23/23]
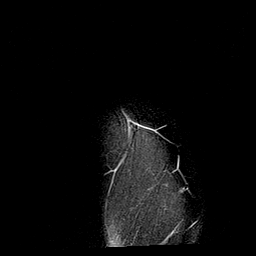

[Series 7: T2 fat-sat · oblique · 4.0mm · 0.55mm/px · 6 of 17 slices shown (3 of 3)]
[im 1/17]
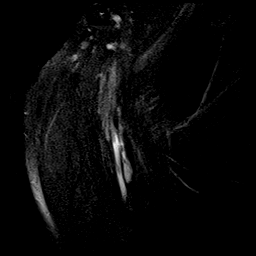
[im 4/17]
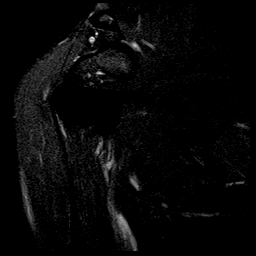
[im 7/17]
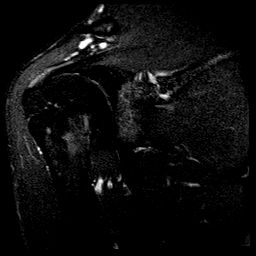
[im 10/17]
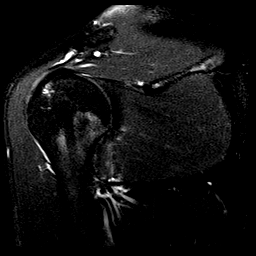
[im 13/17]
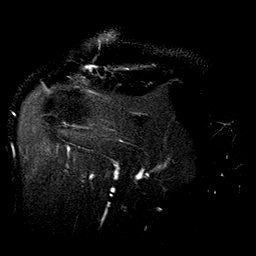
[im 17/17]
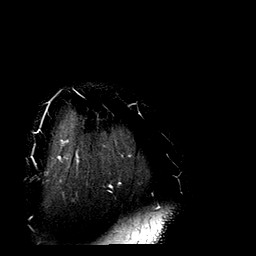

[Series 8: PD · oblique · 4.0mm · 0.27mm/px · 6 of 17 slices shown]
[im 1/17]
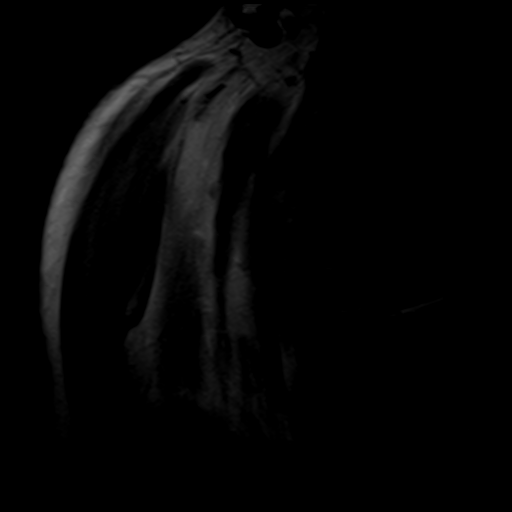
[im 4/17]
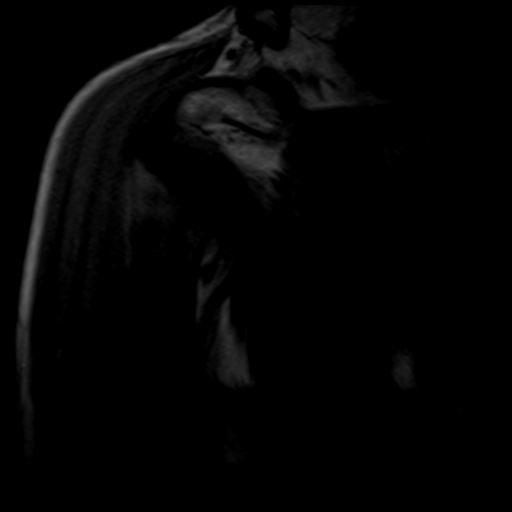
[im 7/17]
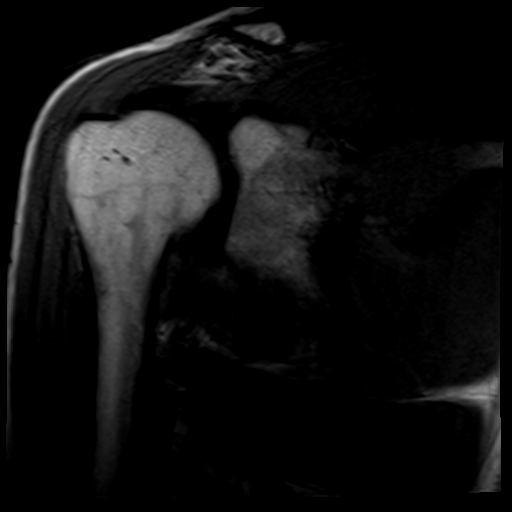
[im 10/17]
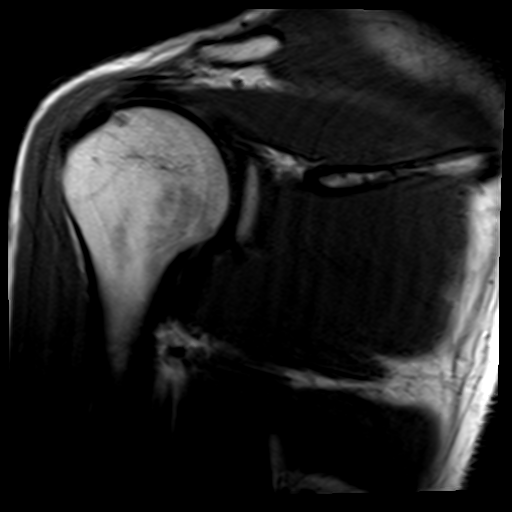
[im 13/17]
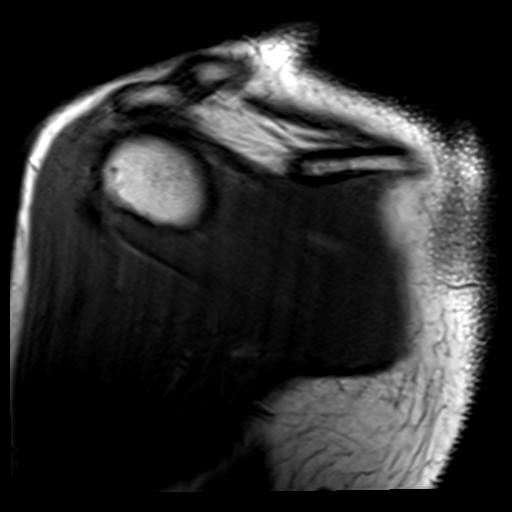
[im 17/17]
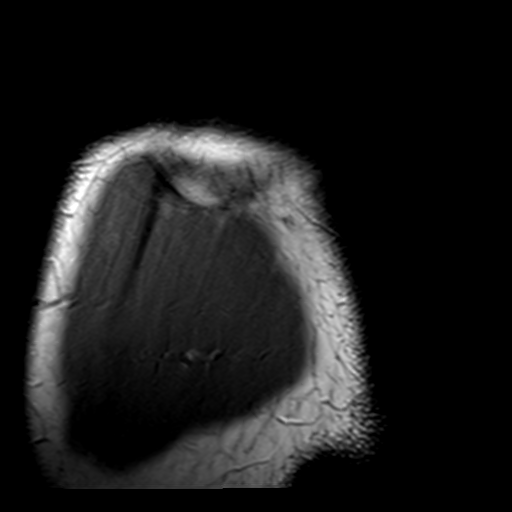

[35 of 40 positions shown; findings below may reference images not displayed]

FINDINGS: Rotator cuff:  Intact.  Very mild supraspinatus tendinopathy noted.

Muscles:  Normal.  No atrophy or focal lesion.

Biceps long head:  Intact and normal in appearance.

Acromioclavicular Joint: Normal. Type 2 acromion. No
subacromial/subdeltoid bursal fluid.

Glenohumeral Joint: Normal.

Labrum:  Intact.

Bones: No fracture or worrisome lesion. 2-3 tiny cysts in the
humeral head at the cuff insertion noted.

Other: None.
IMPRESSION: Very mild supraspinatus tendinopathy without tear. The exam is
otherwise normal.

## 2021-08-03 ENCOUNTER — Encounter: Payer: Self-pay | Admitting: Family Medicine

## 2021-08-03 ENCOUNTER — Ambulatory Visit (INDEPENDENT_AMBULATORY_CARE_PROVIDER_SITE_OTHER): Payer: No Typology Code available for payment source | Admitting: Family Medicine

## 2021-08-03 ENCOUNTER — Other Ambulatory Visit (HOSPITAL_COMMUNITY): Payer: Self-pay

## 2021-08-03 VITALS — BP 98/58 | HR 65 | Temp 97.4°F | Ht 62.0 in | Wt 129.0 lb

## 2021-08-03 DIAGNOSIS — F331 Major depressive disorder, recurrent, moderate: Secondary | ICD-10-CM | POA: Diagnosis not present

## 2021-08-03 DIAGNOSIS — Z308 Encounter for other contraceptive management: Secondary | ICD-10-CM | POA: Diagnosis not present

## 2021-08-03 DIAGNOSIS — F411 Generalized anxiety disorder: Secondary | ICD-10-CM | POA: Diagnosis not present

## 2021-08-03 MED ORDER — ESCITALOPRAM OXALATE 10 MG PO TABS
10.0000 mg | ORAL_TABLET | Freq: Every day | ORAL | 0 refills | Status: DC
  Filled 2021-08-03: qty 30, 30d supply, fill #0

## 2021-08-03 NOTE — Patient Instructions (Addendum)
Start lexapro 10 mg daily  ?Continue lorazepam 0.5 mg one twice daily as needed for Severe Anxiety.  ?Continue counseling.  ? ?Return for physical exam ? ? ?

## 2021-08-03 NOTE — Progress Notes (Signed)
? ?Subjective:  ?Patient ID: Brenda Mcmahon, female    DOB: 09-18-1990  Age: 31 y.o. MRN: [id number] ? ?Chief Complaint  ?Patient presents with  ? Anxiety  ? ?HPI: ?Increased anxiety in the last few weeks. Taking lorazepam 0.5 mg twice daily as needed. She has not taking any daily SSRIs or SNRIs. Patient feels depressed.  ? ? ?  08/03/2021  ? 10:14 AM 08/30/2020  ?  4:32 PM 08/02/2020  ?  4:05 PM  ?PHQ9 SCORE ONLY  ?PHQ-9 Total Score 15 24 23   ? ? ?  08/03/2021  ? 10:14 AM 08/02/2020  ?  4:02 PM 07/08/2020  ?  3:24 PM  ?GAD 7 : Generalized Anxiety Score  ?Nervous, Anxious, on Edge 3 3 3   ?Control/stop worrying 2 3 3   ?Worry too much - different things 3 3 3   ?Trouble relaxing 3 3 3   ?Restless 2 2 3   ?Easily annoyed or irritable 3 3 3   ?Afraid - awful might happen 2 3 3   ?Total GAD 7 Score 18 20 21   ?Anxiety Difficulty Very difficult Very difficult Extremely difficult  ? ? ? ? ?Patient is questioning whether she should switch birth control. On Depo provera shot for over 2 years. Prior to going on depo shot her periods had been heavy, irregular, and painful.  ?  ?Current Outpatient Medications on File Prior to Visit  ?Medication Sig Dispense Refill  ? acebutolol (SECTRAL) 200 MG capsule Take 1 capsule (200 mg total) by mouth daily. 90 capsule 3  ? fluticasone (FLONASE) 50 MCG/ACT nasal spray Place 2 sprays into both nostrils daily. (Patient taking differently: Place 2 sprays into both nostrils daily as needed for allergies or rhinitis.) 16 g 6  ? LORazepam (ATIVAN) 0.5 MG tablet Take 0.5 mg by mouth 2 (two) times daily as needed for anxiety.    ? ?No current facility-administered medications on file prior to visit.  ? ?Past Medical History:  ?Diagnosis Date  ? Palpitation   ? ?Past Surgical History:  ?Procedure Laterality Date  ? FOOT FRACTURE SURGERY Right 2015  ? also ankle fracture repaired.   ? WRIST FRACTURE SURGERY Left   ? 31 yo.  fell through glass door.   ?  ?Family History  ?Problem Relation Age of Onset  ?  Seizures Mother   ? Hypotension Mother   ? Asthma Brother   ? ?Social History  ? ?Socioeconomic History  ? Marital status: Single  ?  Spouse name: Not on file  ? Number of children: Not on file  ? Years of education: Not on file  ? Highest education level: Not on file  ?Occupational History  ? Occupation:  ?  Employer: [employer]  ?Tobacco Use  ? Smoking status: Never  ? Smokeless tobacco: Never  ?Substance and Sexual Activity  ? Alcohol use: No  ? Drug use: Never  ? Sexual activity: Not on file  ?Other Topics Concern  ? Not on file  ?Social History Narrative  ? Not on file  ? ?Social Determinants of Health  ? ?Financial Resource Strain: Not on file  ?Food Insecurity: Not on file  ?Transportation Needs: Not on file  ?Physical Activity: Not on file  ?Stress: Not on file  ?Social Connections: Not on file  ? ? ?Review of Systems  ?Constitutional:  Negative for appetite change, fatigue and fever.  ?HENT:  Negative for congestion, ear pain, sinus pressure and sore throat.   ?Respiratory:  Negative for cough, chest  tightness, shortness of breath and wheezing.   ?Cardiovascular:  Negative for chest pain and palpitations.  ?Gastrointestinal:  Negative for abdominal pain, constipation, diarrhea, nausea and vomiting.  ?Genitourinary:  Negative for dysuria and hematuria.  ?Musculoskeletal:  Negative for arthralgias, back pain, joint swelling and myalgias.  ?Skin:  Negative for rash.  ?Neurological:  Negative for dizziness, weakness and headaches.  ?Psychiatric/Behavioral:  Positive for decreased concentration, dysphoric mood and sleep disturbance. Negative for self-injury and suicidal ideas. The patient is nervous/anxious (Still when driving).   ? ? ?Objective:  ?BP (!) 98/58 (BP Location: Left Arm, Patient Position: Sitting)   Pulse 65   Temp (!) 97.4 ?F (36.3 ?C) (Oral)   Ht 5\' 2"  (1.575 m)   Wt 129 lb (58.5 kg)   SpO2 96%   BMI 23.59 kg/m?  ? ? ?  08/03/2021  ? 10:06 AM 03/22/2021  ?  8:47 AM  11/25/2020  ?  3:45 PM  ?BP/Weight  ?Systolic BP 98 123 120  ?Diastolic BP 58 77 73  ?Wt. (Lbs) 129  134.08  ?BMI 23.59 kg/m2  24.52 kg/m2  ? ? ?Physical Exam ?Vitals reviewed.  ?Constitutional:   ?   Appearance: Normal appearance. She is normal weight.  ?Neck:  ?   Vascular: No carotid bruit.  ?Cardiovascular:  ?   Rate and Rhythm: Normal rate and regular rhythm.  ?   Heart sounds: Normal heart sounds.  ?Pulmonary:  ?   Effort: Pulmonary effort is normal. No respiratory distress.  ?   Breath sounds: Normal breath sounds.  ?Abdominal:  ?   General: Abdomen is flat. Bowel sounds are normal.  ?   Palpations: Abdomen is soft.  ?   Tenderness: There is no abdominal tenderness.  ?Neurological:  ?   Mental Status: She is alert and oriented to person, place, and time.  ?Psychiatric:     ?   Behavior: Behavior normal.  ?   Comments: Anxious and depressed.   ? ? ?Diabetic Foot Exam - Simple   ?No data filed ?  ?  ? ?Lab Results  ?Component Value Date  ? WBC 4.6 07/26/2020  ? HGB 12.6 07/26/2020  ? HCT 38.9 07/26/2020  ? PLT 216 07/26/2020  ? GLUCOSE 87 07/26/2020  ? ALT 8 07/26/2020  ? AST 11 07/26/2020  ? NA 138 07/26/2020  ? K 3.8 07/26/2020  ? CL 103 07/26/2020  ? CREATININE 0.89 07/26/2020  ? BUN 8 07/26/2020  ? CO2 19 (L) 07/26/2020  ? TSH 1.030 07/26/2020  ? ? ? ? ?Assessment & Plan:  ? ?Problem List Items Addressed This Visit   ? ?  ? Other  ? Moderate recurrent major depression (HCC) - Primary  ?  Start lexapro 10 mg daily  ?Continue lorazepam 0.5 mg one twice daily as needed for Severe Anxiety.  ?Continue counseling.  ?  ?  ? Relevant Medications  ? LORazepam (ATIVAN) 0.5 MG tablet  ? escitalopram (LEXAPRO) 10 MG tablet  ? GAD (generalized anxiety disorder)  ?  Start lexapro 10 mg daily  ?Continue lorazepam 0.5 mg one twice daily as needed for Severe Anxiety.  ?Continue counseling.  ?  ?  ? Relevant Medications  ? LORazepam (ATIVAN) 0.5 MG tablet  ? escitalopram (LEXAPRO) 10 MG tablet  ? Encounter for other  contraceptive management  ?  Start on seasonale this Sunday. Stop Depo shot. Use other form of protection (condoms) for the first half of May. ? ?  ?  ?. ? ?  Meds ordered this encounter  ?Medications  ? escitalopram (LEXAPRO) 10 MG tablet  ?  Sig: Take 1 tablet (10 mg total) by mouth daily.  ?  Dispense:  30 tablet  ?  Refill:  0  ? ? ?No orders of the defined types were placed in this encounter. ?  ? ?Follow-up: Return in about 4 weeks (around 08/31/2021) for cpe fasting. ? ?An After Visit Summary was printed and given to the patient. ? ?I,Lauren M Auman,acting as a scribe for Blane Ohara, MD.,have documented all relevant documentation on the behalf of Blane Ohara, MD,as directed by  Blane Ohara, MD while in the presence of Blane Ohara, MD.  ? ?Blane Ohara, MD ?[facility] ?[phone] ?

## 2021-08-05 ENCOUNTER — Other Ambulatory Visit: Payer: Self-pay | Admitting: Family Medicine

## 2021-08-05 ENCOUNTER — Other Ambulatory Visit (HOSPITAL_COMMUNITY): Payer: Self-pay

## 2021-08-05 MED ORDER — LEVONORGEST-ETH ESTRAD 91-DAY 0.15-0.03 MG PO TABS
1.0000 | ORAL_TABLET | Freq: Every day | ORAL | 3 refills | Status: DC
  Filled 2021-08-05: qty 91, 91d supply, fill #0
  Filled 2021-10-27: qty 91, 91d supply, fill #1
  Filled 2022-01-28: qty 91, 91d supply, fill #2
  Filled 2022-05-02: qty 91, 91d supply, fill #3

## 2021-08-07 DIAGNOSIS — Z308 Encounter for other contraceptive management: Secondary | ICD-10-CM | POA: Insufficient documentation

## 2021-08-07 DIAGNOSIS — F411 Generalized anxiety disorder: Secondary | ICD-10-CM

## 2021-08-07 DIAGNOSIS — F331 Major depressive disorder, recurrent, moderate: Secondary | ICD-10-CM

## 2021-08-07 DIAGNOSIS — F32A Depression, unspecified: Secondary | ICD-10-CM

## 2021-08-07 HISTORY — DX: Major depressive disorder, recurrent, moderate: F33.1

## 2021-08-07 HISTORY — DX: Encounter for other contraceptive management: Z30.8

## 2021-08-07 HISTORY — DX: Depression, unspecified: F32.A

## 2021-08-07 HISTORY — DX: Generalized anxiety disorder: F41.1

## 2021-08-07 NOTE — Assessment & Plan Note (Signed)
Start lexapro 10 mg daily  ?Continue lorazepam 0.5 mg one twice daily as needed for Severe Anxiety.  ?Continue counseling.  ?

## 2021-08-07 NOTE — Assessment & Plan Note (Signed)
Start on seasonale this Sunday. Stop Depo shot. Use other form of protection (condoms) for the first half of May. ?

## 2021-08-07 NOTE — Assessment & Plan Note (Signed)
>>  ASSESSMENT AND PLAN FOR MODERATE RECURRENT MAJOR DEPRESSION (HCC) WRITTEN ON 08/07/2021 10:59 AM BY Shaquilla Kehres, MD  Start lexapro 10 mg daily  Continue lorazepam 0.5 mg one twice daily as needed for Severe Anxiety.  Continue counseling.

## 2021-09-08 NOTE — Progress Notes (Signed)
Subjective:  Patient ID: Brenda Mcmahon, female    DOB: 04-04-1991  Age: 31 y.o. MRN: [id number]  Chief Complaint  Patient presents with   Annual Exam    HPI Well Adult Physical: Patient here for a comprehensive physical exam.The patient reports no problems Do you take any herbs or supplements that were not prescribed by a doctor? no Are you taking calcium supplements? no Are you taking aspirin daily? no  Encounter for general adult medical examination without abnormal findings  Physical ("At Risk" items are starred): Patient's last physical exam was 1 year ago .  Patient is not afflicted from Stress Incontinence and Urge Incontinence  Patient wears a seat belt, has smoke detectors, has carbon monoxide detectors. Dental Care: biannual cleanings, brushes and flosses daily. Ophthalmology/Optometry: Has not been in a long time.  Hearing loss: none Vision impairments: none  Menarche: spotting today.  Flowsheet Row Office Visit from 09/13/2021 in [facility]  PHQ-2 Total Score 2        Social Hx   Social History   Socioeconomic History   Marital status: Single    Spouse name: Not on file   Number of children: Not on file   Years of education: Not on file   Highest education level: Not on file  Occupational History   Occupation: Pharmacologist    Employer: [employer]  Tobacco Use   Smoking status: Never   Smokeless tobacco: Never  Substance and Sexual Activity   Alcohol use: No   Drug use: Never   Sexual activity: Not on file  Other Topics Concern   Not on file  Social History Narrative   Not on file   Social Determinants of Health   Financial Resource Strain: Low Risk    Difficulty of Paying Living Expenses: Not very hard  Food Insecurity: No Food Insecurity   Worried About Running Out of Food in the Last Year: Never true   Ran Out of Food in the Last Year: Never true  Transportation Needs: No Transportation Needs   Lack of Transportation  (Medical): No   Lack of Transportation (Non-Medical): No  Physical Activity: Insufficiently Active   Days of Exercise per Week: 3 days   Minutes of Exercise per Session: 30 min  Stress: Not on file  Social Connections: Not on file   Past Medical History:  Diagnosis Date   Palpitation    Past Surgical History:  Procedure Laterality Date   FOOT FRACTURE SURGERY Right 2015   also ankle fracture repaired.    WRIST FRACTURE SURGERY Left    31 yo.  fell through glass door.     Family History  Problem Relation Age of Onset   Seizures Mother    Hypotension Mother    Asthma Brother     Review of Systems  Constitutional:  Negative for appetite change, fatigue and fever.  HENT:  Negative for congestion, ear pain, sinus pressure and sore throat.   Respiratory:  Negative for cough, chest tightness, shortness of breath and wheezing.   Cardiovascular:  Negative for chest pain and palpitations.  Gastrointestinal:  Negative for abdominal pain, constipation, diarrhea, nausea and vomiting.  Genitourinary:  Negative for dysuria and hematuria.  Musculoskeletal:  Negative for arthralgias, back pain, joint swelling and myalgias.  Skin:  Negative for rash.  Neurological:  Negative for dizziness, weakness and headaches.  Psychiatric/Behavioral:  Negative for dysphoric mood. The patient is nervous/anxious.     Objective:  BP 106/68  Pulse 64   Temp (!) 97.2 F (36.2 C)   Resp 16   Ht 5\' 2"  (1.575 m)   Wt 131 lb (59.4 kg)   BMI 23.96 kg/m      09/13/2021   11:13 AM 08/03/2021   10:06 AM 03/22/2021    8:47 AM  BP/Weight  Systolic BP 106 98 123  Diastolic BP 68 58 77  Wt. (Lbs) 131 129   BMI 23.96 kg/m2 23.59 kg/m2     Physical Exam Vitals reviewed.  Constitutional:      General: She is not in acute distress.    Appearance: Normal appearance. She is normal weight.  HENT:     Right Ear: Tympanic membrane and ear canal normal.     Left Ear: Tympanic membrane and ear canal normal.      Nose: Nose normal. No congestion or rhinorrhea.     Mouth/Throat:     Pharynx: No oropharyngeal exudate or posterior oropharyngeal erythema.  Neck:     Thyroid: No thyroid mass.     Vascular: No carotid bruit.  Cardiovascular:     Rate and Rhythm: Normal rate and regular rhythm.     Heart sounds: Normal heart sounds. No murmur heard. Pulmonary:     Effort: Pulmonary effort is normal.     Breath sounds: Normal breath sounds.  Abdominal:     General: Abdomen is flat. Bowel sounds are normal.     Palpations: Abdomen is soft. There is no mass.     Tenderness: There is no abdominal tenderness.  Lymphadenopathy:     Cervical: No cervical adenopathy.  Neurological:     Mental Status: She is alert and oriented to person, place, and time.     Cranial Nerves: No cranial nerve deficit.  Psychiatric:        Mood and Affect: Mood normal.        Behavior: Behavior normal.    Lab Results  Component Value Date   WBC 5.0 09/13/2021   HGB 13.8 09/13/2021   HCT 41.7 09/13/2021   PLT 230 09/13/2021   GLUCOSE 84 09/13/2021   CHOL 147 09/13/2021   TRIG 58 09/13/2021   HDL 65 09/13/2021   LDLCALC 70 09/13/2021   ALT 14 09/13/2021   AST 20 09/13/2021   NA 137 09/13/2021   K 4.5 09/13/2021   CL 102 09/13/2021   CREATININE 0.88 09/13/2021   BUN 7 09/13/2021   CO2 22 09/13/2021   TSH 1.620 09/13/2021      Assessment & Plan:   Problem List Items Addressed This Visit       Other   Encounter for general adult medical examination without abnormal findings - Primary    Things to do to keep yourself healthy  - Exercise at least 30-45 minutes a day, 3-4 days a week.  - Eat a diet with lots of fruits and vegetables, up to 7-9 servings per day.  - Seatbelts can save your life. Wear them always.  - Smoke detectors on every level of your home, check batteries every year.  - Eye Doctor - have an eye exam every 1-2 years  - Safe sex - if you may be exposed to STDs, use a condom.  -  Alcohol -  If you drink, do it moderately, less than 2 drinks per day.  - Depression is common in our stressful world.If you're feeling down or losing interest in things you normally enjoy, please come in for a visit.  -  Violence - If anyone is threatening or hurting you, please call immediately.  Recommend call [facility] for routine eye exam.      Relevant Orders   CBC with Differential/Platelet (Completed)   Comprehensive metabolic panel (Completed)   Lipid panel (Completed)   TSH (Completed)   Need for DTaP vaccine   Relevant Orders   Tdap vaccine greater than or equal to 7yo IM (Completed)      Body mass index is 23.96 kg/m.    This is a list of the screening recommended for you and due dates:  Health Maintenance  Topic Date Due   HIV Screening  Never done   Hepatitis C Screening: USPSTF Recommendation to screen - Ages 8018-30 yo.  Never done   Pap Smear  Never done   Pap Smear  Never done   COVID-19 Vaccine (3 - Booster for Pfizer series) 03/12/2020   Flu Shot  11/15/2021   Tetanus Vaccine  09/14/2031   HPV Vaccine  Aged Out     Meds ordered this encounter  Medications   LORazepam (ATIVAN) 0.5 MG tablet    Sig: Take 1 tablet (0.5 mg total) by mouth 2 (two) times daily as needed for anxiety.    Dispense:  30 tablet    Refill:  5    Follow-up: Return in about 1 year (around 09/14/2022) for cpe.  An After Visit Summary was printed and given to the patient.  I,Lauren M Auman,acting as a scribe for Blane OharaKirsten Kathie Posa, MD.,have documented all relevant documentation on the behalf of Blane OharaKirsten Teona Vargus, MD,as directed by  Blane OharaKirsten Madysun Thall, MD while in the presence of Blane OharaKirsten Jannelly Bergren, MD.   Blane OharaKirsten Austyn Seier, MD [facility] [phone]

## 2021-09-09 ENCOUNTER — Encounter: Payer: No Typology Code available for payment source | Admitting: Family Medicine

## 2021-09-13 ENCOUNTER — Other Ambulatory Visit (HOSPITAL_COMMUNITY): Payer: Self-pay

## 2021-09-13 ENCOUNTER — Ambulatory Visit (INDEPENDENT_AMBULATORY_CARE_PROVIDER_SITE_OTHER): Payer: No Typology Code available for payment source | Admitting: Family Medicine

## 2021-09-13 VITALS — BP 106/68 | HR 64 | Temp 97.2°F | Resp 16 | Ht 62.0 in | Wt 131.0 lb

## 2021-09-13 DIAGNOSIS — Z0000 Encounter for general adult medical examination without abnormal findings: Secondary | ICD-10-CM

## 2021-09-13 DIAGNOSIS — Z23 Encounter for immunization: Secondary | ICD-10-CM

## 2021-09-13 MED ORDER — LORAZEPAM 0.5 MG PO TABS
0.5000 mg | ORAL_TABLET | Freq: Two times a day (BID) | ORAL | 5 refills | Status: DC | PRN
  Filled 2021-09-13: qty 30, 15d supply, fill #0

## 2021-09-13 NOTE — Patient Instructions (Addendum)
Things to do to keep yourself healthy  - Exercise at least 30-45 minutes a day, 3-4 days a week.  - Eat a diet with lots of fruits and vegetables, up to 7-9 servings per day.  - Seatbelts can save your life. Wear them always.  - Smoke detectors on every level of your home, check batteries every year.  - Eye Doctor - have an eye exam every 1-2 years  - Safe sex - if you may be exposed to STDs, use a condom.  - Alcohol -  If you drink, do it moderately, less than 2 drinks per day.  - Depression is common in our stressful world.If you're feeling down or losing interest in things you normally enjoy, please come in for a visit.  - Violence - If anyone is threatening or hurting you, please call immediately.  Recommend call [facility] for routine eye exam. 

## 2021-09-14 LAB — LIPID PANEL
Chol/HDL Ratio: 2.3 ratio (ref 0.0–4.4)
Cholesterol, Total: 147 mg/dL (ref 100–199)
HDL: 65 mg/dL (ref >39)
LDL Chol Calc (NIH): 70 mg/dL (ref 0–99)
Triglycerides: 58 mg/dL (ref 0–149)
VLDL Cholesterol Cal: 12 mg/dL (ref 5–40)

## 2021-09-14 LAB — COMPREHENSIVE METABOLIC PANEL
ALT: 14 IU/L (ref 0–32)
AST: 20 IU/L (ref 0–40)
Albumin/Globulin Ratio: 1.7 (ref 1.2–2.2)
Albumin: 4.4 g/dL (ref 3.9–5.0)
Alkaline Phosphatase: 40 IU/L — ABNORMAL LOW (ref 44–121)
BUN/Creatinine Ratio: 8 — ABNORMAL LOW (ref 9–23)
BUN: 7 mg/dL (ref 6–20)
Bilirubin Total: 0.5 mg/dL (ref 0.0–1.2)
CO2: 22 mmol/L (ref 20–29)
Calcium: 9.4 mg/dL (ref 8.7–10.2)
Chloride: 102 mmol/L (ref 96–106)
Creatinine, Ser: 0.88 mg/dL (ref 0.57–1.00)
Globulin, Total: 2.6 g/dL (ref 1.5–4.5)
Glucose: 84 mg/dL (ref 70–99)
Potassium: 4.5 mmol/L (ref 3.5–5.2)
Sodium: 137 mmol/L (ref 134–144)
Total Protein: 7 g/dL (ref 6.0–8.5)
eGFR: 91 mL/min/{1.73_m2} (ref >59)

## 2021-09-14 LAB — CBC WITH DIFFERENTIAL/PLATELET
Basophils Absolute: 0 10*3/uL (ref 0.0–0.2)
Basos: 1 %
EOS (ABSOLUTE): 0.2 10*3/uL (ref 0.0–0.4)
Eos: 5 %
Hematocrit: 41.7 % (ref 34.0–46.6)
Hemoglobin: 13.8 g/dL (ref 11.1–15.9)
Immature Grans (Abs): 0 10*3/uL (ref 0.0–0.1)
Immature Granulocytes: 0 %
Lymphocytes Absolute: 1.4 10*3/uL (ref 0.7–3.1)
Lymphs: 28 %
MCH: 28.9 pg (ref 26.6–33.0)
MCHC: 33.1 g/dL (ref 31.5–35.7)
MCV: 87 fL (ref 79–97)
Monocytes Absolute: 0.3 10*3/uL (ref 0.1–0.9)
Monocytes: 5 %
Neutrophils Absolute: 3.1 10*3/uL (ref 1.4–7.0)
Neutrophils: 61 %
Platelets: 230 10*3/uL (ref 150–450)
RBC: 4.77 x10E6/uL (ref 3.77–5.28)
RDW: 11.8 % (ref 11.7–15.4)
WBC: 5 10*3/uL (ref 3.4–10.8)

## 2021-09-14 LAB — TSH: TSH: 1.62 u[IU]/mL (ref 0.450–4.500)

## 2021-09-18 ENCOUNTER — Encounter: Payer: Self-pay | Admitting: Family Medicine

## 2021-09-18 DIAGNOSIS — Z0000 Encounter for general adult medical examination without abnormal findings: Secondary | ICD-10-CM

## 2021-09-18 DIAGNOSIS — Z23 Encounter for immunization: Secondary | ICD-10-CM

## 2021-09-18 HISTORY — DX: Encounter for immunization: Z23

## 2021-09-18 HISTORY — DX: Encounter for general adult medical examination without abnormal findings: Z00.00

## 2021-09-18 NOTE — Assessment & Plan Note (Signed)
Things to do to keep yourself healthy  - Exercise at least 30-45 minutes a day, 3-4 days a week.  - Eat a diet with lots of fruits and vegetables, up to 7-9 servings per day.  - Seatbelts can save your life. Wear them always.  - Smoke detectors on every level of your home, check batteries every year.  - Eye Doctor - have an eye exam every 1-2 years  - Safe sex - if you may be exposed to STDs, use a condom.  - Alcohol -  If you drink, do it moderately, less than 2 drinks per day.  - Depression is common in our stressful world.If you're feeling down or losing interest in things you normally enjoy, please come in for a visit.  - Violence - If anyone is threatening or hurting you, please call immediately.  Recommend call [facility] for routine eye exam.

## 2021-09-20 ENCOUNTER — Other Ambulatory Visit (HOSPITAL_COMMUNITY): Payer: Self-pay

## 2021-10-27 ENCOUNTER — Other Ambulatory Visit (HOSPITAL_COMMUNITY): Payer: Self-pay

## 2021-11-22 ENCOUNTER — Ambulatory Visit (INDEPENDENT_AMBULATORY_CARE_PROVIDER_SITE_OTHER): Payer: No Typology Code available for payment source | Admitting: Cardiology

## 2021-11-22 ENCOUNTER — Encounter: Payer: Self-pay | Admitting: Cardiology

## 2021-11-22 ENCOUNTER — Other Ambulatory Visit (HOSPITAL_COMMUNITY): Payer: Self-pay

## 2021-11-22 VITALS — BP 98/62 | HR 82 | Ht 62.0 in | Wt 130.1 lb

## 2021-11-22 DIAGNOSIS — I471 Supraventricular tachycardia: Secondary | ICD-10-CM | POA: Diagnosis not present

## 2021-11-22 DIAGNOSIS — R002 Palpitations: Secondary | ICD-10-CM

## 2021-11-22 MED ORDER — ACEBUTOLOL HCL 200 MG PO CAPS
200.0000 mg | ORAL_CAPSULE | Freq: Two times a day (BID) | ORAL | 3 refills | Status: DC
  Filled 2021-11-22 – 2021-12-12 (×2): qty 180, 90d supply, fill #0
  Filled 2022-05-02: qty 180, 90d supply, fill #1

## 2021-11-22 NOTE — Patient Instructions (Signed)
Medication Instructions:  Your physician has recommended you make the following change in your medication:   START: Acebutolol 200 mg twice daily  *If you need a refill on your cardiac medications before your next appointment, please call your pharmacy*   Lab Work: None If you have labs (blood work) drawn today and your tests are completely normal, you will receive your results only by: MyChart Message (if you have MyChart) OR A paper copy in the mail If you have any lab test that is abnormal or we need to change your treatment, we will call you to review the results.   Testing/Procedures: None   Follow-Up: At [facility], you and your health needs are our priority.  As part of our continuing mission to provide you with exceptional heart care, we have created designated Provider Care Teams.  These Care Teams include your primary Cardiologist (physician) and Advanced Practice Providers (APPs -  Physician Assistants and Nurse Practitioners) who all work together to provide you with the care you need, when you need it.  We recommend signing up for the patient portal called "MyChart".  Sign up information is provided on this After Visit Summary.  MyChart is used to connect with patients for Virtual Visits (Telemedicine).  Patients are able to view lab/test results, encounter notes, upcoming appointments, etc.  Non-urgent messages can be sent to your provider as well.   To learn more about what you can do with MyChart, go to [email or web address].    Your next appointment:   9 month(s)  The format for your next appointment:   In Person  Provider:   Norman Herrlich, MD    Other Instructions She will decide if she wants to take the second dose if needed  Important Information About Sugar

## 2021-11-22 NOTE — Progress Notes (Signed)
Cardiology Office Note:    Date:  11/22/2021   ID:  [name], DOB 1990/12/09, MRN [id number]  PCP:  Blane Ohara, MD  Cardiologist:  Norman Herrlich, MD    Referring MD: Blane Ohara, MD    ASSESSMENT:    1. SVT (supraventricular tachycardia) (HCC)   2. Palpitations    PLAN:    In order of problems listed above:  I think is fairly conclusive that she is having intermittent SVT has done better with the beta-blocker if she would like she can take a second dose in the evening she will use her judgment and we will do our best to capture an episode utilizing her smart watch wearing it at nighttime.  If episodes persist or life disruptive fails therapy with a beta-blocker would be appropriate for EP catheter ablation.   Next appointment: 6 months   Medication Adjustments/Labs and Tests Ordered: Current medicines are reviewed at length with the patient today.  Concerns regarding medicines are outlined above.  Orders Placed This Encounter  Procedures   EKG 12-Lead   Meds ordered this encounter  Medications   acebutolol (SECTRAL) 200 MG capsule    Sig: Take 1 capsule (200 mg total) by mouth 2 (two) times daily.    Dispense:  180 capsule    Refill:  3    No chief complaint on file.   History of Present Illness:    Brenda Mcmahon is a 31 y.o. female with a hx of palpitation last seen 11/25/2020.  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  Compliance with diet, lifestyle and medications: Yes  She is improved but still has intermittent episodes of rapid heart rhythm and her Apple Watch has documented 2 episodes of rates greater than 141 greater than 156 bpm.  Unfortunately she does not wear her Apple Watch at night have asked her to charge it in the evenings before bed capture episodes and send them by MyChart I will redo her  prescription that she can take her beta-blocker twice a day and let her decide if she wants to take an extra dose in the evening If she continues to have episodes despite the beta-blocker and if documented we could consider EP catheter ablation. Past Medical History:  Diagnosis Date   Palpitation     Past Surgical History:  Procedure Laterality Date   FOOT FRACTURE SURGERY Right 2015   also ankle fracture repaired.    WRIST FRACTURE SURGERY Left    31 yo.  fell through glass door.     Current Medications: Current Meds  Medication Sig   acebutolol (SECTRAL) 200 MG capsule Take 1 capsule (200 mg total) by mouth 2 (two) times daily.   escitalopram (LEXAPRO) 10 MG tablet Take 1 tablet (10 mg total) by mouth daily.   fluticasone (FLONASE) 50 MCG/ACT nasal spray Place 2 sprays into both nostrils daily. (Patient taking differently: Place 2 sprays into both nostrils as needed for allergies or rhinitis.)   levonorgestrel-ethinyl estradiol (SEASONALE) 0.15-0.03 MG tablet Take 1 tablet by mouth daily.   LORazepam (ATIVAN) 0.5 MG tablet Take 1 tablet (0.5 mg total) by mouth 2 (two) times daily as needed for anxiety.   [DISCONTINUED] acebutolol (SECTRAL) 200 MG capsule Take 1 capsule (200 mg total) by mouth daily.     Allergies:   Hydrocodone, Kale, Oxycodone, Lactose intolerance (gi), and Hydrocodone-acetaminophen   Social History  Socioeconomic History   Marital status: Single    Spouse name: Not on file   Number of children: Not on file   Years of education: Not on file   Highest education level: Not on file  Occupational History   Occupation: Pharmacologist    Employer: [employer]  Tobacco Use   Smoking status: Never   Smokeless tobacco: Never  Substance and Sexual Activity   Alcohol use: No   Drug use: Never   Sexual activity: Not on file  Other Topics Concern   Not on file  Social History Narrative   Not on file   Social Determinants of Health   Financial Resource  Strain: Low Risk  (09/13/2021)   Overall Financial Resource Strain (CARDIA)    Difficulty of Paying Living Expenses: Not very hard  Food Insecurity: No Food Insecurity (09/13/2021)   Hunger Vital Sign    Worried About Running Out of Food in the Last Year: Never true    Ran Out of Food in the Last Year: Never true  Transportation Needs: No Transportation Needs (09/13/2021)   PRAPARE - Administrator, Civil Service (Medical): No    Lack of Transportation (Non-Medical): No  Physical Activity: Insufficiently Active (09/13/2021)   Exercise Vital Sign    Days of Exercise per Week: 3 days    Minutes of Exercise per Session: 30 min  Stress: Not on file  Social Connections: Not on file     Family History: The patient's family history includes Asthma in her brother; Hypotension in her mother; Seizures in her mother. ROS:   Please see the history of present illness.    All other systems reviewed and are negative.  EKGs/Labs/Other Studies Reviewed:    The following studies were reviewed today:  EKG:  EKG ordered today and personally reviewed.  The ekg ordered today demonstrates sinus rhythm remains normal  Recent Labs: 09/13/2021: ALT 14; BUN 7; Creatinine, Ser 0.88; Hemoglobin 13.8; Platelets 230; Potassium 4.5; Sodium 137; TSH 1.620  Recent Lipid Panel    Component Value Date/Time   CHOL 147 09/13/2021 1159   TRIG 58 09/13/2021 1159   HDL 65 09/13/2021 1159   CHOLHDL 2.3 09/13/2021 1159   LDLCALC 70 09/13/2021 1159    Physical Exam:    VS:  BP 98/62   Pulse 82   Ht 5\' 2"  (1.575 m)   Wt 130 lb 1.3 oz (59 kg)   SpO2 100%   BMI 23.79 kg/m     Wt Readings from Last 3 Encounters:  11/22/21 130 lb 1.3 oz (59 kg)  09/13/21 131 lb (59.4 kg)  08/03/21 129 lb (58.5 kg)     GEN:  Well nourished, well developed in no acute distress HEENT: Normal NECK: No JVD; No carotid bruits LYMPHATICS: No lymphadenopathy CARDIAC: RRR, no murmurs, rubs, gallops RESPIRATORY:  Clear to  auscultation without rales, wheezing or rhonchi  ABDOMEN: Soft, non-tender, non-distended MUSCULOSKELETAL:  No edema; No deformity  SKIN: Warm and dry NEUROLOGIC:  Alert and oriented x 3 PSYCHIATRIC:  Normal affect    Signed, 08/05/21, MD  11/22/2021 11:59 AM    [facility]

## 2021-12-12 ENCOUNTER — Other Ambulatory Visit (HOSPITAL_COMMUNITY): Payer: Self-pay

## 2022-01-28 ENCOUNTER — Other Ambulatory Visit: Payer: Self-pay | Admitting: Family Medicine

## 2022-01-29 MED ORDER — ESCITALOPRAM OXALATE 10 MG PO TABS
10.0000 mg | ORAL_TABLET | Freq: Every day | ORAL | 1 refills | Status: DC
  Filled 2022-01-29: qty 90, 90d supply, fill #0

## 2022-01-30 ENCOUNTER — Other Ambulatory Visit (HOSPITAL_COMMUNITY): Payer: Self-pay

## 2022-05-02 ENCOUNTER — Other Ambulatory Visit: Payer: Self-pay

## 2022-06-30 ENCOUNTER — Ambulatory Visit (HOSPITAL_COMMUNITY)
Admission: EM | Admit: 2022-06-30 | Discharge: 2022-06-30 | Disposition: A | Discharge status: Home / Self Care | Payer: PRIVATE HEALTH INSURANCE | Attending: Internal Medicine | Admitting: Internal Medicine

## 2022-06-30 ENCOUNTER — Ambulatory Visit (HOSPITAL_COMMUNITY): Payer: 59

## 2022-06-30 ENCOUNTER — Encounter (HOSPITAL_COMMUNITY): Payer: Self-pay

## 2022-06-30 ENCOUNTER — Ambulatory Visit (INDEPENDENT_AMBULATORY_CARE_PROVIDER_SITE_OTHER): Payer: PRIVATE HEALTH INSURANCE

## 2022-06-30 DIAGNOSIS — M542 Cervicalgia: Secondary | ICD-10-CM | POA: Diagnosis not present

## 2022-06-30 MED ORDER — METHOCARBAMOL 500 MG PO TABS: 500.0000 mg | ORAL_TABLET | Freq: Two times a day (BID) | ORAL | 0 refills | Status: DC

## 2022-06-30 MED ORDER — ONDANSETRON 4 MG PO TBDP
ORAL_TABLET | ORAL | Status: AC
  Filled 2022-06-30: qty 1

## 2022-06-30 MED ORDER — IBUPROFEN 800 MG PO TABS
ORAL_TABLET | ORAL | Status: AC
  Filled 2022-06-30: qty 1

## 2022-06-30 MED ORDER — IBUPROFEN 600 MG PO TABS: 600.0000 mg | ORAL_TABLET | Freq: Four times a day (QID) | ORAL | 0 refills | Status: DC | PRN

## 2022-06-30 MED ORDER — IBUPROFEN 800 MG PO TABS
800.0000 mg | ORAL_TABLET | Freq: Once | ORAL | Status: AC
  Administered 2022-06-30: 800 mg via ORAL

## 2022-06-30 MED ORDER — ONDANSETRON 4 MG PO TBDP
4.0000 mg | ORAL_TABLET | Freq: Once | ORAL | Status: AC
  Administered 2022-06-30: 4 mg via ORAL

## 2022-06-30 NOTE — ED Provider Notes (Incomplete)
[place]    CSN: [id number] Arrival date & time: 06/30/22  1657      History   Chief Complaint Chief Complaint  Patient presents with  . Neck Injury    HPI Brenda Mcmahon is a 32 y.o. female.    Neck Injury   Past Medical History:  Diagnosis Date  . Palpitation     Patient Active Problem List   Diagnosis Date Noted  . Encounter for general adult medical examination without abnormal findings 09/18/2021  . Need for DTaP vaccine 09/18/2021  . Moderate recurrent major depression ([place]) 08/07/2021  . GAD (generalized anxiety disorder) 08/07/2021  . Encounter for other contraceptive management 08/07/2021  . Palpitation 11/10/2020  . Achilles bursitis of right lower extremity 03/14/2017  . Depression, major, single episode, moderate ([place]) 03/14/2017    Past Surgical History:  Procedure Laterality Date  . FOOT FRACTURE SURGERY Right 2015   also ankle fracture repaired.   . WRIST FRACTURE SURGERY Left    32 yo.  fell through glass door.     OB History   No obstetric history on file.      Home Medications    Prior to Admission medications   Medication Sig Start Date End Date Taking? Authorizing Provider  acebutolol (SECTRAL) 200 MG capsule Take 1 capsule (200 mg total) by mouth 2 (two) times daily. Patient taking differently: Take 200 mg by mouth daily. 11/22/21  Yes Richardo Priest, MD  levonorgestrel-ethinyl estradiol (SEASONALE) 0.15-0.03 MG tablet Take 1 tablet by mouth daily. 08/05/21  Yes Cox, Kirsten, MD  LORazepam (ATIVAN) 0.5 MG tablet Take 1 tablet (0.5 mg total) by mouth 2 (two) times daily as needed for anxiety. 09/13/21  Yes Cox, Kirsten, MD  escitalopram (LEXAPRO) 10 MG tablet Take 1 tablet (10 mg total) by mouth daily. 01/29/22   Cox, Elnita Maxwell, MD  fluticasone (FLONASE) 50 MCG/ACT nasal spray Place 2 sprays into both nostrils daily. Patient taking differently: Place 2 sprays into both nostrils as needed for allergies or rhinitis.  08/02/20   Rip Harbour, NP    Family History Family History  Problem Relation Age of Onset  . Seizures Mother   . Hypotension Mother   . Asthma Brother     Social History Social History   Tobacco Use  . Smoking status: Never  . Smokeless tobacco: Never  Substance Use Topics  . Alcohol use: No  . Drug use: Never     Allergies   Hydrocodone, Kale, Oxycodone, Lactose intolerance (gi), and Hydrocodone-acetaminophen   Review of Systems Review of Systems   Physical Exam Triage Vital Signs ED Triage Vitals  Enc Vitals Group     BP 06/30/22 1732 118/73     Pulse Rate 06/30/22 1732 65     Resp 06/30/22 1732 16     Temp 06/30/22 1732 98.2 F (36.8 C)     Temp Source 06/30/22 1732 Oral     SpO2 06/30/22 1732 98 %     Weight 06/30/22 1732 125 lb (56.7 kg)     Height 06/30/22 1732 5\' 2"  (1.575 m)     Head Circumference --      Peak Flow --      Pain Score 06/30/22 1729 9     Pain Loc --      Pain Edu? --      Excl. in [place]? --    No data found.  Updated Vital Signs BP 118/73 (BP Location: Right Arm)  Pulse 65   Temp 98.2 F (36.8 C) (Oral)   Resp 16   Ht 5\' 2"  (1.575 m)   Wt 125 lb (56.7 kg)   LMP  (LMP Unknown)   SpO2 98%   BMI 22.86 kg/m   Visual Acuity Right Eye Distance:   Left Eye Distance:   Bilateral Distance:    Right Eye Near:   Left Eye Near:    Bilateral Near:     Physical Exam   UC Treatments / Results  Labs (all labs ordered are listed, but only abnormal results are displayed) Labs Reviewed - No data to display  EKG   Radiology No results found.  Procedures Procedures (including critical care time)  Medications Ordered in UC Medications  ondansetron (ZOFRAN-ODT) disintegrating tablet 4 mg (4 mg Oral Given 06/30/22 1813)  ibuprofen (ADVIL) tablet 800 mg (800 mg Oral Given 06/30/22 1818)    Initial Impression / Assessment and Plan / UC Course  I have reviewed the triage vital signs and the nursing notes.  Pertinent  labs & imaging results that were available during my care of the patient were reviewed by me and considered in my medical decision making (see chart for details).     *** Final Clinical Impressions(s) / UC Diagnoses   Final diagnoses:  None   Discharge Instructions   None    ED Prescriptions   None    PDMP not reviewed this encounter.

## 2022-06-30 NOTE — Discharge Instructions (Addendum)
X-rays of your neck are negative for fracture or dislocation.  I would like for you to take ibuprofen 600 mg every 6 hours as needed for pain and inflammation to the neck.  Apply heat to the neck to relieve muscle tension.  You may also take Robaxin muscle relaxer at bedtime as needed to help with muscle spasm and tension to the neck as a result of injury.  Do not take Robaxin and drink alcohol, drive, or go to work as it can make you very sleepy.  Follow-up with [facility] as needed for ongoing evaluation.  If you develop any new or worsening symptoms or do not improve in the next 2 to 3 days, please return.  If your symptoms are severe, please go to the emergency room.  Follow-up with your primary care provider for further evaluation and management of your symptoms as well as ongoing wellness visits.  I hope you feel better!

## 2022-06-30 NOTE — ED Triage Notes (Signed)
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 When back out she hit her head on a piece of metal. No emesis or passing out. Patient states she "saw stars" Onset 6:45 am today.
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
## 2022-07-31 ENCOUNTER — Ambulatory Visit: Payer: PRIVATE HEALTH INSURANCE | Attending: Family Medicine

## 2022-07-31 ENCOUNTER — Other Ambulatory Visit: Payer: Self-pay

## 2022-07-31 DIAGNOSIS — M542 Cervicalgia: Secondary | ICD-10-CM | POA: Insufficient documentation

## 2022-07-31 DIAGNOSIS — M6281 Muscle weakness (generalized): Secondary | ICD-10-CM | POA: Diagnosis present

## 2022-07-31 DIAGNOSIS — R252 Cramp and spasm: Secondary | ICD-10-CM | POA: Insufficient documentation

## 2022-07-31 DIAGNOSIS — R293 Abnormal posture: Secondary | ICD-10-CM | POA: Diagnosis present

## 2022-08-04 ENCOUNTER — Ambulatory Visit: Payer: PRIVATE HEALTH INSURANCE | Attending: Family Medicine

## 2022-08-04 DIAGNOSIS — R293 Abnormal posture: Secondary | ICD-10-CM | POA: Diagnosis present

## 2022-08-04 DIAGNOSIS — R252 Cramp and spasm: Secondary | ICD-10-CM | POA: Insufficient documentation

## 2022-08-04 DIAGNOSIS — M6281 Muscle weakness (generalized): Secondary | ICD-10-CM | POA: Insufficient documentation

## 2022-08-04 DIAGNOSIS — M542 Cervicalgia: Secondary | ICD-10-CM | POA: Diagnosis present

## 2022-08-04 NOTE — Therapy (Signed)
OUTPATIENT PHYSICAL THERAPY TREATMENT NOTE   Patient Name: Brenda Mcmahon MRN: [id number] DOB:1990-07-19, 32 y.o., female Today's Date: 08/04/2022  PCP: Blane Ohara, MD  REFERRING PROVIDER:   Lanell Persons, MD    END OF SESSION:   PT End of Session - 08/04/22 1144     Visit Number 2    Number of Visits 13    Date for PT Re-Evaluation 09/16/22    Authorization Type Worker's comp    Authorization - Visit Number 2    Authorization - Number of Visits 6    PT Start Time 1145    PT Stop Time 1240    PT Time Calculation (min) 55 min    Activity Tolerance Patient tolerated treatment well    Behavior During Therapy WFL for tasks assessed/performed             Past Medical History:  Diagnosis Date   Palpitation    Past Surgical History:  Procedure Laterality Date   FOOT FRACTURE SURGERY Right 2015   also ankle fracture repaired.    WRIST FRACTURE SURGERY Left    32 yo.  fell through glass door.    Patient Active Problem List   Diagnosis Date Noted   Encounter for general adult medical examination without abnormal findings 09/18/2021   Need for DTaP vaccine 09/18/2021   Moderate recurrent major depression 08/07/2021   GAD (generalized anxiety disorder) 08/07/2021   Encounter for other contraceptive management 08/07/2021   Palpitation 11/10/2020   Achilles bursitis of right lower extremity 03/14/2017   Depression, major, single episode, moderate 03/14/2017    REFERRING DIAG: X52.84XL (ICD-10-CM) - Contusion of other specified part of neck, subsequent encounter   THERAPY DIAG:  Cervicalgia  Cramp and spasm  Abnormal posture  Muscle weakness (generalized)  Rationale for Evaluation and Treatment Rehabilitation  PERTINENT HISTORY: Work-related injury on 06/30/22   PRECAUTIONS: none   SUBJECTIVE:                                                                                                                                                                                       SUBJECTIVE STATEMENT:  Patient put together a bistro patio 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    PAIN:  Are you having pain? Yes: NPRS scale: 9/10 Pain location: Rt posterior neck, periscapular region Pain description: sharp, dull  Aggravating factors: arm movement  Relieving factors: heat   OBJECTIVE: (objective measures completed at initial evaluation unless otherwise dated)  DIAGNOSTIC FINDINGS:  IMPRESSION: Negative cervical spine radiographs.   PATIENT SURVEYS:  FOTO 57% function to 66% predicted    COGNITION: Overall cognitive status: Within functional  limits for tasks assessed   SENSATION: Not tested   POSTURE: rounded shoulders and forward head   PALPATION: Tautness and palpable tenderness Rt upper trap, cervical paraspinals, middle trap, levator scapulae, rhomboids             CERVICAL ROM:    Active ROM A/PROM (deg) eval  Flexion 42 pn  Extension 50 "sore"  Right lateral flexion 35 pn  Left lateral flexion 30 pn  Right rotation 80 "pull"  Left rotation 60 pn   (Blank rows = not tested)   UPPER EXTREMITY ROM:   Active ROM Right eval Left eval  Shoulder flexion 145 "sore" Full   Shoulder extension      Shoulder abduction      Shoulder adduction      Shoulder extension      Shoulder internal rotation      Shoulder external rotation      Elbow flexion      Elbow extension      Wrist flexion      Wrist extension      Wrist ulnar deviation      Wrist radial deviation      Wrist pronation      Wrist supination       (Blank rows = not tested)   UPPER EXTREMITY MMT:   MMT Right eval Left eval  Shoulder flexion 4+ pn 5  Shoulder extension      Shoulder abduction 5 5  Shoulder adduction      Shoulder extension      Shoulder internal rotation      Shoulder external rotation      Middle trapezius 4+ pn 4+   Lower trapezius 4- pn 4-   Elbow flexion      Elbow extension      Wrist flexion      Wrist extension       Wrist ulnar deviation      Wrist radial deviation      Wrist pronation      Wrist supination      Grip strength       (Blank rows = not tested)   CERVICAL SPECIAL TESTS:  Cervical compression (-) Cervical distraction  (-)    FUNCTIONAL TESTS:  N/A [facility] Adult PT Treatment:                                                DATE: 08/04/22 Therapeutic Exercise: Seated thoracic extension x 10  Sidelying open book x 10 each  Supine resisted horizontal shoulder abduction red band d/c due to pain Prone scapular retraction 2 x 10  Crossover stretch x 30 sec  Levator scap stretch x 30 sec  Manual Therapy: STM/DTM/Trigger point release cervical paraspinals, rhomboids, trapezius, levator scapulae J-thrust cervicothoracic manipulation Trigger Point Dry Needling Treatment: Pre-treatment instruction: Patient instructed on dry needling rationale, procedures, and possible side effects including pain during treatment (achy,cramping feeling), bruising, drop of blood, lightheadedness, nausea, sweating. Patient Consent Given: Yes Education handout provided: Previously provided Muscles treated: Rt rhomboids   Needle size and number: .25x39mm x 1 Electrical stimulation performed: No Parameters: N/A Treatment response/outcome: Palpable decrease in muscle tension Post-treatment instructions: Patient instructed to expect possible mild to moderate muscle soreness later today and/or tomorrow. Patient instructed in methods to reduce muscle soreness and to continue prescribed HEP. If patient was  dry needled over the lung field, patient was instructed on signs and symptoms of pneumothorax and, however unlikely, to see immediate medical attention should they occur. Patient was also educated on signs and symptoms of infection and to seek medical attention should they occur. Patient verbalized understanding of these instructions and education. Modalities: MHP to cervical region x 10 minutes   [facility] Adult PT  Treatment:                                                DATE: 07/31/22 Therapeutic Exercise: Demonstrated and issue initial HEP.  Manual Therapy: STM Rt upper trap/levator scapulae Demo and returned demo of use of tennis ball for self soft tissue mobilization  Therapeutic Activity: Education on assessment findings that will be addressed throughout duration of POC.  Modalities: MHP to cervical region x 10 minutes Trigger Point Dry Needling Treatment: Pre-treatment instruction: Patient instructed on dry needling rationale, procedures, and possible side effects including pain during treatment (achy,cramping feeling), bruising, drop of blood, lightheadedness, nausea, sweating. Patient Consent Given: Yes Education handout provided: Previously provided Muscles treated: Rt upper trap   Needle size and number: .30x52mm x 1 Treatment response/outcome: Twitch response elicited and Palpable decrease in muscle tension Post-treatment instructions: Patient instructed to expect possible mild to moderate muscle soreness later today and/or tomorrow. Patient instructed in methods to reduce muscle soreness and to continue prescribed HEP. If patient was dry needled over the lung field, patient was instructed on signs and symptoms of pneumothorax and, however unlikely, to see immediate medical attention should they occur. Patient was also educated on signs and symptoms of infection and to seek medical attention should they occur. Patient verbalized understanding of these instructions and education.         PATIENT EDUCATION:  Education details: TPDN, HEP review Person educated: Patient Education method: Explanation Education comprehension: verbalized understanding   HOME EXERCISE PROGRAM: Access Code: [email or web address] URL: [email or web address] Date: 07/31/2022 Prepared by: Letitia Libra   Exercises - Supine Chin Tuck  - 2 x daily - 7 x weekly - 2 sets - 10 reps - 5 sec  hold - Seated Upper  Trapezius Stretch  - 2 x daily - 7 x weekly - 2 sets - 20 sec  hold - Seated Levator Scapulae Stretch  - 2 x daily - 7 x weekly - 2 sets - 20 sec  hold - Supine Shoulder Flexion Extension AAROM with Dowel  - 2 x daily - 7 x weekly - 1 sets - 10 reps - 5 sec  hold   ASSESSMENT:   CLINICAL IMPRESSION: Patient arrives for first treatment session reporting worsening of her neck/upper back pain secondary to putting together furniture yesterday. Heavy emphasis on manual therapy with patient noted to have tautness about periscapular and cervical musculature. Focused on progression of cervical/thoracic mobility and postural strengthening with fairly good tolerance. She did report an increase in pain with resisted horizontal shoulder abduction, so this was discontinued. She reported a reduction in pain at conclusion of session rated as 7/10.    OBJECTIVE IMPAIRMENTS: decreased activity tolerance, decreased knowledge of condition, decreased mobility, decreased ROM, decreased strength, increased fascial restrictions, impaired flexibility, impaired UE functional use, improper body mechanics, postural dysfunction, and pain.    ACTIVITY LIMITATIONS: carrying, lifting, and reach over head   PARTICIPATION LIMITATIONS: meal prep, cleaning, laundry, driving, shopping, occupation,  and yard work   PERSONAL FACTORS: Age, Profession, and Time since onset of injury/illness/exacerbation are also affecting patient's functional outcome.    REHAB POTENTIAL: Good   CLINICAL DECISION MAKING: Stable/uncomplicated   EVALUATION COMPLEXITY: Low     GOALS: Goals reviewed with patient? Yes   SHORT TERM GOALS: Target date: 08/21/2022       Patient will be independent and compliant with initial HEP.    Baseline: issued at eval  Goal status: INITIAL   2.  Patient will demonstrate at least 70 degrees of Lt cervical rotation AROM to improve ability to complete head turns while driving.  Baseline: see above  Goal status:  INITIAL   3.  Patient will demonstrate at least 50 degrees of cervical flexion AROM to improve tolerance to reading and work tasks.  Baseline: see above  Goal status: INITIAL     LONG TERM GOALS: Target date: 09/16/22   Patient will demonstrate at least 160 degrees of Rt shoulder flexion AROM to improve ability to reach overhead at work.  Baseline: see above  Goal status: INITIAL   2.  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  Baseline: unable  Goal status: INITIAL   3.  Patient will score at least 66% on FOTO to signify clinically meaningful improvement in functional abilities.    Baseline: see above  Goal status: INITIAL   4.  Patient will report pain as </=2/10 to reduce her current functional limitations.  Baseline: see above  Goal status: INITIAL       PLAN:   PT FREQUENCY: 1-2x/week   PT DURATION: 6 weeks   PLANNED INTERVENTIONS: Therapeutic exercises, Therapeutic activity, Neuromuscular re-education, Patient/Family education, Self Care, Joint mobilization, Dry Needling, Spinal manipulation, Spinal mobilization, Cryotherapy, Moist heat, Traction, Manual therapy, and Re-evaluation   PLAN FOR NEXT SESSION: review and progress HEP prn; TPDN response, manual/TPDN to cervical region, cervical/shoulder mobility, strengthening as tolerated.   Letitia Libra, PT, DPT, ATC 08/04/22 1:16 PM

## 2022-08-05 DIAGNOSIS — R293 Abnormal posture: Secondary | ICD-10-CM | POA: Diagnosis present

## 2022-08-05 DIAGNOSIS — R252 Cramp and spasm: Secondary | ICD-10-CM | POA: Diagnosis present

## 2022-08-05 DIAGNOSIS — M6281 Muscle weakness (generalized): Secondary | ICD-10-CM | POA: Diagnosis present

## 2022-08-05 DIAGNOSIS — M542 Cervicalgia: Secondary | ICD-10-CM | POA: Diagnosis present

## 2022-08-14 ENCOUNTER — Ambulatory Visit: Payer: PRIVATE HEALTH INSURANCE

## 2022-08-14 DIAGNOSIS — R293 Abnormal posture: Secondary | ICD-10-CM

## 2022-08-14 DIAGNOSIS — R252 Cramp and spasm: Secondary | ICD-10-CM

## 2022-08-14 DIAGNOSIS — M542 Cervicalgia: Secondary | ICD-10-CM

## 2022-08-14 DIAGNOSIS — M6281 Muscle weakness (generalized): Secondary | ICD-10-CM

## 2022-08-14 NOTE — Therapy (Signed)
OUTPATIENT PHYSICAL THERAPY TREATMENT NOTE   Patient Name: Brenda Mcmahon MRN: [id number] DOB:26-Jul-1990, 32 y.o., female Today's Date: 08/14/2022  PCP: Blane Ohara, MD  REFERRING PROVIDER:   Lanell Persons, MD    END OF SESSION:   PT End of Session - 08/14/22 1145     Visit Number 3    Number of Visits 13    Date for PT Re-Evaluation 09/16/22    Authorization Type Worker's comp    Authorization - Visit Number 3    Authorization - Number of Visits 6    PT Start Time 1145    PT Stop Time 1230    PT Time Calculation (min) 45 min    Activity Tolerance Patient tolerated treatment well    Behavior During Therapy WFL for tasks assessed/performed              Past Medical History:  Diagnosis Date   Palpitation    Past Surgical History:  Procedure Laterality Date   FOOT FRACTURE SURGERY Right 2015   also ankle fracture repaired.    WRIST FRACTURE SURGERY Left    33 yo.  fell through glass door.    Patient Active Problem List   Diagnosis Date Noted   Encounter for general adult medical examination without abnormal findings 09/18/2021   Need for DTaP vaccine 09/18/2021   Moderate recurrent major depression (HCC) 08/07/2021   GAD (generalized anxiety disorder) 08/07/2021   Encounter for other contraceptive management 08/07/2021   Palpitation 11/10/2020   Achilles bursitis of right lower extremity 03/14/2017   Depression, major, single episode, moderate (HCC) 03/14/2017    REFERRING DIAG: X32.44WN (ICD-10-CM) - Contusion of other specified part of neck, subsequent encounter   THERAPY DIAG:  Cervicalgia  Cramp and spasm  Abnormal posture  Muscle weakness (generalized)  Rationale for Evaluation and Treatment Rehabilitation  PERTINENT HISTORY: Work-related injury on 06/30/22   PRECAUTIONS: none   SUBJECTIVE:                                                                                                                                                                                       SUBJECTIVE STATEMENT:  "I've been uncomfortable since my last visit." She is having difficulty with using syringes at work due to pain. She has f/u with MD on Wednesday.    PAIN:  Are you having pain? Yes: NPRS scale: 9/10 Pain location: Rt scapula and upper thoracic (between T-spine and medial scapular border)  Pain description: sharp, dull  Aggravating factors: arm movement  Relieving factors: heat   OBJECTIVE: (objective measures completed at initial evaluation unless otherwise dated)  DIAGNOSTIC FINDINGS:  IMPRESSION: Negative cervical  spine radiographs.   PATIENT SURVEYS:  FOTO 57% function to 66% predicted    COGNITION: Overall cognitive status: Within functional limits for tasks assessed   SENSATION: Not tested   POSTURE: rounded shoulders and forward head   PALPATION: Tautness and palpable tenderness Rt upper trap, cervical paraspinals, middle trap, levator scapulae, rhomboids             CERVICAL ROM:    Active ROM A/PROM (deg) eval 08/14/22  Flexion 42 pn 62 "pulling"   Extension 50 "sore" 50 "pulling"  Right lateral flexion 35 pn   Left lateral flexion 30 pn   Right rotation 80 "pull"   Left rotation 60 pn    (Blank rows = not tested)   UPPER EXTREMITY ROM:   Active ROM Right eval Left eval  Shoulder flexion 145 "sore" Full   Shoulder extension      Shoulder abduction      Shoulder adduction      Shoulder extension      Shoulder internal rotation      Shoulder external rotation      Elbow flexion      Elbow extension      Wrist flexion      Wrist extension      Wrist ulnar deviation      Wrist radial deviation      Wrist pronation      Wrist supination       (Blank rows = not tested)   UPPER EXTREMITY MMT:   MMT Right eval Left eval  Shoulder flexion 4+ pn 5  Shoulder extension      Shoulder abduction 5 5  Shoulder adduction      Shoulder extension      Shoulder internal rotation      Shoulder  external rotation      Middle trapezius 4+ pn 4+   Lower trapezius 4- pn 4-   Elbow flexion      Elbow extension      Wrist flexion      Wrist extension      Wrist ulnar deviation      Wrist radial deviation      Wrist pronation      Wrist supination      Grip strength       (Blank rows = not tested)   CERVICAL SPECIAL TESTS:  Cervical compression (-) Cervical distraction  (-)    FUNCTIONAL TESTS:  N/A  [facility] Adult PT Treatment:                                                DATE: 08/14/22 Therapeutic Exercise: Seated cervical retraction 2 x 10 Seated cervical retraction with extension x 10  Prone scapular retraction 2 x 10  Bilateral shoulder ER red band 2 x 10  Updated HEP  Manual Therapy: DTM Rt upper trap, levator scapulae, cervical paraspinals, rhomboids C2-T4 CPAs grade III Cervical distraction 4 x 30 sec  Trigger Point Dry Needling Treatment: Pre-treatment instruction: Patient instructed on dry needling rationale, procedures, and possible side effects including pain during treatment (achy,cramping feeling), bruising, drop of blood, lightheadedness, nausea, sweating. Patient Consent Given: Yes Education handout provided: Previously provided Muscles treated: Rt upper trap   Needle size and number: .25x70mm x 1 Electrical stimulation performed: No Parameters: N/A Treatment response/outcome: Twitch response elicited and Palpable decrease  in muscle tension Post-treatment instructions: Patient instructed to expect possible mild to moderate muscle soreness later today and/or tomorrow. Patient instructed in methods to reduce muscle soreness and to continue prescribed HEP. If patient was dry needled over the lung field, patient was instructed on signs and symptoms of pneumothorax and, however unlikely, to see immediate medical attention should they occur. Patient was also educated on signs and symptoms of infection and to seek medical attention should they occur. Patient  verbalized understanding of these instructions and education.   [facility] Adult PT Treatment:                                                DATE: 08/04/22 Therapeutic Exercise: Seated thoracic extension x 10  Sidelying open book x 10 each  Supine resisted horizontal shoulder abduction red band d/c due to pain Prone scapular retraction 2 x 10  Crossover stretch x 30 sec  Levator scap stretch x 30 sec  Manual Therapy: STM/DTM/Trigger point release cervical paraspinals, rhomboids, trapezius, levator scapulae J-thrust cervicothoracic manipulation Trigger Point Dry Needling Treatment: Pre-treatment instruction: Patient instructed on dry needling rationale, procedures, and possible side effects including pain during treatment (achy,cramping feeling), bruising, drop of blood, lightheadedness, nausea, sweating. Patient Consent Given: Yes Education handout provided: Previously provided Muscles treated: Rt rhomboids   Needle size and number: .25x40mm x 1 Electrical stimulation performed: No Parameters: N/A Treatment response/outcome: Palpable decrease in muscle tension Post-treatment instructions: Patient instructed to expect possible mild to moderate muscle soreness later today and/or tomorrow. Patient instructed in methods to reduce muscle soreness and to continue prescribed HEP. If patient was dry needled over the lung field, patient was instructed on signs and symptoms of pneumothorax and, however unlikely, to see immediate medical attention should they occur. Patient was also educated on signs and symptoms of infection and to seek medical attention should they occur. Patient verbalized understanding of these instructions and education. Modalities: MHP to cervical region x 10 minutes   [facility] Adult PT Treatment:                                                DATE: 07/31/22 Therapeutic Exercise: Demonstrated and issue initial HEP.  Manual Therapy: STM Rt upper trap/levator scapulae Demo and returned  demo of use of tennis ball for self soft tissue mobilization  Therapeutic Activity: Education on assessment findings that will be addressed throughout duration of POC.  Modalities: MHP to cervical region x 10 minutes Trigger Point Dry Needling Treatment: Pre-treatment instruction: Patient instructed on dry needling rationale, procedures, and possible side effects including pain during treatment (achy,cramping feeling), bruising, drop of blood, lightheadedness, nausea, sweating. Patient Consent Given: Yes Education handout provided: Previously provided Muscles treated: Rt upper trap   Needle size and number: .30x57mm x 1 Treatment response/outcome: Twitch response elicited and Palpable decrease in muscle tension Post-treatment instructions: Patient instructed to expect possible mild to moderate muscle soreness later today and/or tomorrow. Patient instructed in methods to reduce muscle soreness and to continue prescribed HEP. If patient was dry needled over the lung field, patient was instructed on signs and symptoms of pneumothorax and, however unlikely, to see immediate medical attention should they occur. Patient was also educated on signs and  symptoms of infection and to seek medical attention should they occur. Patient verbalized understanding of these instructions and education.         PATIENT EDUCATION:  Education details: TPDN, HEP update Person educated: Patient Education method: Explanation, demo, cues, handout Education comprehension: verbalized understanding, returned demo    HOME EXERCISE PROGRAM: Access Code: [email or web address] URL: [email or web address] Date: 07/31/2022 Prepared by: Letitia Libra   Exercises - Supine Chin Tuck  - 2 x daily - 7 x weekly - 2 sets - 10 reps - 5 sec  hold - Seated Upper Trapezius Stretch  - 2 x daily - 7 x weekly - 2 sets - 20 sec  hold - Seated Levator Scapulae Stretch  - 2 x daily - 7 x weekly - 2 sets - 20 sec  hold - Supine Shoulder  Flexion Extension AAROM with Dowel  - 2 x daily - 7 x weekly - 1 sets - 10 reps - 5 sec  hold   ASSESSMENT:   CLINICAL IMPRESSION: Patient continues to report high pain levels about the Rt upper back/scapula upon arrival. She reports a decrease in pain with manual cervical distraction rated as 6/10. With repeated cervical retraction her pain does start to centralize from the upper thoracic region to her cervical paraspinals. She did report a brief episode of nausea towards end of reps on second set of repeated retraction, but this quickly subsided. No nausea with repeated cervical retraction with extension and she reported continued reduction in her pain. Good tolerance to periscapular strengthening without an increase in pain reported. She rated her pain as 5.5/10 at conclusion of session.    OBJECTIVE IMPAIRMENTS: decreased activity tolerance, decreased knowledge of condition, decreased mobility, decreased ROM, decreased strength, increased fascial restrictions, impaired flexibility, impaired UE functional use, improper body mechanics, postural dysfunction, and pain.    ACTIVITY LIMITATIONS: carrying, lifting, and reach over head   PARTICIPATION LIMITATIONS: meal prep, cleaning, laundry, driving, shopping, occupation, and yard work   PERSONAL FACTORS: Age, Profession, and Time since onset of injury/illness/exacerbation are also affecting patient's functional outcome.    REHAB POTENTIAL: Good   CLINICAL DECISION MAKING: Stable/uncomplicated   EVALUATION COMPLEXITY: Low     GOALS: Goals reviewed with patient? Yes   SHORT TERM GOALS: Target date: 08/21/2022       Patient will be independent and compliant with initial HEP.    Baseline: issued at eval  Goal status: met   2.  Patient will demonstrate at least 70 degrees of Lt cervical rotation AROM to improve ability to complete head turns while driving.  Baseline: see above  Goal status: INITIAL   3.  Patient will demonstrate at  least 50 degrees of cervical flexion AROM to improve tolerance to reading and work tasks.  Baseline: see above  Goal status: met     LONG TERM GOALS: Target date: 09/16/22   Patient will demonstrate at least 160 degrees of Rt shoulder flexion AROM to improve ability to reach overhead at work.  Baseline: see above  Goal status: INITIAL   2.  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  Baseline: unable  Goal status: INITIAL   3.  Patient will score at least 66% on FOTO to signify clinically meaningful improvement in functional abilities.    Baseline: see above  Goal status: INITIAL   4.  Patient will report pain as </=2/10  to reduce her current functional limitations.  Baseline: see above  Goal status: INITIAL       PLAN:   PT FREQUENCY: 1-2x/week   PT DURATION: 6 weeks   PLANNED INTERVENTIONS: Therapeutic exercises, Therapeutic activity, Neuromuscular re-education, Patient/Family education, Self Care, Joint mobilization, Dry Needling, Spinal manipulation, Spinal mobilization, Cryotherapy, Moist heat, Traction, Manual therapy, and Re-evaluation   PLAN FOR NEXT SESSION: review and progress HEP prn; TPDN response, manual/TPDN to cervical region, cervical/shoulder mobility, strengthening as tolerated. Repeated movement response.   Letitia Libra, PT, DPT, ATC 08/14/22 12:31 PM

## 2022-08-16 ENCOUNTER — Ambulatory Visit: Payer: PRIVATE HEALTH INSURANCE

## 2022-08-16 ENCOUNTER — Other Ambulatory Visit: Payer: Self-pay | Admitting: Family Medicine

## 2022-08-16 DIAGNOSIS — M542 Cervicalgia: Secondary | ICD-10-CM

## 2022-08-16 NOTE — Therapy (Signed)
OUTPATIENT PHYSICAL THERAPY TREATMENT NOTE   Patient Name: Brenda Mcmahon MRN: [id number] DOB:12-02-1990, 32 y.o., female Today's Date: 08/17/2022  PCP: Blane Ohara, MD  REFERRING PROVIDER:   Lanell Persons, MD    END OF SESSION:   PT End of Session - 08/17/22 1239     Visit Number 4    Number of Visits 13    Date for PT Re-Evaluation 09/16/22    Authorization Type Worker's comp    Authorization - Visit Number 4    Authorization - Number of Visits 6    PT Start Time 1145    PT Stop Time 1230    PT Time Calculation (min) 45 min    Activity Tolerance Patient tolerated treatment well    Behavior During Therapy WFL for tasks assessed/performed               Past Medical History:  Diagnosis Date   Palpitation    Past Surgical History:  Procedure Laterality Date   FOOT FRACTURE SURGERY Right 2015   also ankle fracture repaired.    WRIST FRACTURE SURGERY Left    32 yo.  fell through glass door.    Patient Active Problem List   Diagnosis Date Noted   Encounter for general adult medical examination without abnormal findings 09/18/2021   Need for DTaP vaccine 09/18/2021   Moderate recurrent major depression (HCC) 08/07/2021   GAD (generalized anxiety disorder) 08/07/2021   Encounter for other contraceptive management 08/07/2021   Palpitation 11/10/2020   Achilles bursitis of right lower extremity 03/14/2017   Depression, major, single episode, moderate (HCC) 03/14/2017    REFERRING DIAG: W09.81XB (ICD-10-CM) - Contusion of other specified part of neck, subsequent encounter   THERAPY DIAG:  Cervicalgia  Cramp and spasm  Abnormal posture  Muscle weakness (generalized)  Rationale for Evaluation and Treatment Rehabilitation  PERTINENT HISTORY: Work-related injury on 06/30/22   PRECAUTIONS: none    SUBJECTIVE:                                                                                                                                                                                      SUBJECTIVE STATEMENT:  Patient states she is not as flared up as she was last visit. She did see the doctor yesterday and they did x-rays and everything looked good, but she is still tight. She reports she does feel better overall but does still feel tight.   PAIN:  Are you having pain? Yes:  NPRS scale: 5/10 Pain location: Rt scapula and upper thoracic (between T-spine and medial scapular border)  Pain description: sharp, dull  Aggravating factors: arm movement  Relieving factors: heat  OBJECTIVE: (objective measures completed at initial evaluation unless otherwise dated) DIAGNOSTIC FINDINGS:  IMPRESSION: Negative cervical spine radiographs.   PATIENT SURVEYS:  FOTO 57% function to 66% predicted   POSTURE:  Rounded shoulders and forward head   PALPATION: Tautness and palpable tenderness Rt upper trap, cervical paraspinals, middle trap, levator scapulae, rhomboids             CERVICAL ROM:    Active ROM A/PROM (deg) eval 08/14/22  Flexion 42 pn 62 "pulling"   Extension 50 "sore" 50 "pulling"  Right lateral flexion 35 pn   Left lateral flexion 30 pn   Right rotation 80 "pull"   Left rotation 60 pn    (Blank rows = not tested)   UPPER EXTREMITY ROM:   Active ROM Right eval Left eval  Shoulder flexion 145 "sore" Full   Shoulder extension      Shoulder abduction      Shoulder adduction      Shoulder extension      Shoulder internal rotation      Shoulder external rotation      Elbow flexion      Elbow extension      Wrist flexion      Wrist extension      Wrist ulnar deviation      Wrist radial deviation      Wrist pronation      Wrist supination       (Blank rows = not tested)   UPPER EXTREMITY MMT:   MMT Right eval Left eval  Shoulder flexion 4+ pn 5  Shoulder extension      Shoulder abduction 5 5  Shoulder adduction      Shoulder extension      Shoulder internal rotation      Shoulder external rotation       Middle trapezius 4+ pn 4+   Lower trapezius 4- pn 4-   Elbow flexion      Elbow extension      Wrist flexion      Wrist extension      Wrist ulnar deviation      Wrist radial deviation      Wrist pronation      Wrist supination      Grip strength       (Blank rows = not tested)   CERVICAL SPECIAL TESTS:  Cervical compression (-) Cervical distraction  (-)    FUNCTIONAL TESTS:  N/A   TODAY'S TREATMENT [facility] Adult PT Treatment:                                                DATE: 08/17/22 Therapeutic Exercise: Sidelying thoracic rotation x 5 each Supine thoracic extension over FR at various level  Child's pose thoracic rotation x 5 each Cat cow x 5 Seated cervical retraction with extension x 10  Row with blue x 10 Manual Therapy: Skilled palpation and monitoring of muscle tension while performing TPDN DTM right upper trap, levator scapulae, cervical paraspinals, rhomboids Trigger Point Dry Needling Treatment: Pre-treatment instruction: Patient instructed on dry needling rationale, procedures, and possible side effects including pain during treatment (achy,cramping feeling), bruising, drop of blood, lightheadedness, nausea, sweating. Patient Consent Given: Yes Education handout provided: Previously provided Muscles treated: Right upper trap and rhomboid/mid trap region Needle size and number: .25x30mm x 2, .30x40mm x 4 Electrical stimulation  performed: No Parameters: N/A Treatment response/outcome: Twitch response elicited and Palpable decrease in muscle tension Post-treatment instructions: Patient instructed to expect possible mild to moderate muscle soreness later today and/or tomorrow. Patient instructed in methods to reduce muscle soreness and to continue prescribed HEP. If patient was dry needled over the lung field, patient was instructed on signs and symptoms of pneumothorax and, however unlikely, to see immediate medical attention should they occur. Patient was also  educated on signs and symptoms of infection and to seek medical attention should they occur. Patient verbalized understanding of these instructions and education.   [facility] Adult PT Treatment:                                                DATE: 08/14/22 Therapeutic Exercise: Seated cervical retraction 2 x 10 Seated cervical retraction with extension x 10  Prone scapular retraction 2 x 10  Bilateral shoulder ER red band 2 x 10  Updated HEP  Manual Therapy: DTM Rt upper trap, levator scapulae, cervical paraspinals, rhomboids C2-T4 CPAs grade III Cervical distraction 4 x 30 sec  Trigger Point Dry Needling Treatment: Pre-treatment instruction: Patient instructed on dry needling rationale, procedures, and possible side effects including pain during treatment (achy,cramping feeling), bruising, drop of blood, lightheadedness, nausea, sweating. Patient Consent Given: Yes Education handout provided: Previously provided Muscles treated: Rt upper trap   Needle size and number: .25x24mm x 1 Electrical stimulation performed: No Parameters: N/A Treatment response/outcome: Twitch response elicited and Palpable decrease in muscle tension Post-treatment instructions: Patient instructed to expect possible mild to moderate muscle soreness later today and/or tomorrow. Patient instructed in methods to reduce muscle soreness and to continue prescribed HEP. If patient was dry needled over the lung field, patient was instructed on signs and symptoms of pneumothorax and, however unlikely, to see immediate medical attention should they occur. Patient was also educated on signs and symptoms of infection and to seek medical attention should they occur. Patient verbalized understanding of these instructions and education.  [facility] Adult PT Treatment:                                                DATE: 08/04/22 Therapeutic Exercise: Seated thoracic extension x 10  Sidelying open book x 10 each  Supine resisted horizontal  shoulder abduction red band d/c due to pain Prone scapular retraction 2 x 10  Crossover stretch x 30 sec  Levator scap stretch x 30 sec  Manual Therapy: STM/DTM/Trigger point release cervical paraspinals, rhomboids, trapezius, levator scapulae J-thrust cervicothoracic manipulation Trigger Point Dry Needling Treatment: Pre-treatment instruction: Patient instructed on dry needling rationale, procedures, and possible side effects including pain during treatment (achy,cramping feeling), bruising, drop of blood, lightheadedness, nausea, sweating. Patient Consent Given: Yes Education handout provided: Previously provided Muscles treated: Rt rhomboids   Needle size and number: .25x74mm x 1 Electrical stimulation performed: No Parameters: N/A Treatment response/outcome: Palpable decrease in muscle tension Post-treatment instructions: Patient instructed to expect possible mild to moderate muscle soreness later today and/or tomorrow. Patient instructed in methods to reduce muscle soreness and to continue prescribed HEP. If patient was dry needled over the lung field, patient was instructed on signs and symptoms of pneumothorax and, however unlikely, to see  immediate medical attention should they occur. Patient was also educated on signs and symptoms of infection and to seek medical attention should they occur. Patient verbalized understanding of these instructions and education. Modalities: MHP to cervical region x 10 minutes    PATIENT EDUCATION:  Education details: TPDN, HEP update Person educated: Patient Education method: Explanation, demo, cues, handout Education comprehension: verbalized understanding, returned demo    HOME EXERCISE PROGRAM: Access Code: [email or web address]    ASSESSMENT: CLINICAL IMPRESSION: Patient tolerated therapy well with no adverse effects. She did report improvement in symptoms this visit with primary complaint of pain right thoracic/rhomboid region. Continued with TPDN and  focused primarily on rhomboid region. Therapy focused on thoracic mobility and progressing postural strengthening. She did report feeling of tightness/blocked with right thoracic rotation but as she performed more reps she was able to improve the mobility. She was educated that she could perform gym exercises but to start at light resistance and gradually progress as she felt comfortable. Updated HEP to included thoracic mobility and patient would benefit from continued skilled PT to progress her mobility and strength in order to reduce pain and maximize functional ability.    OBJECTIVE IMPAIRMENTS: decreased activity tolerance, decreased knowledge of condition, decreased mobility, decreased ROM, decreased strength, increased fascial restrictions, impaired flexibility, impaired UE functional use, improper body mechanics, postural dysfunction, and pain.    ACTIVITY LIMITATIONS: carrying, lifting, and reach over head   PARTICIPATION LIMITATIONS: meal prep, cleaning, laundry, driving, shopping, occupation, and yard work   PERSONAL FACTORS: Age, Profession, and Time since onset of injury/illness/exacerbation are also affecting patient's functional outcome.      GOALS: Goals reviewed with patient? Yes   SHORT TERM GOALS: Target date: 08/21/2022   Patient will be independent and compliant with initial HEP.    Baseline: issued at eval  Goal status: met   2.  Patient will demonstrate at least 70 degrees of Lt cervical rotation AROM to improve ability to complete head turns while driving.  Baseline: see above  Goal status: INITIAL   3.  Patient will demonstrate at least 50 degrees of cervical flexion AROM to improve tolerance to reading and work tasks.  Baseline: see above  Goal status: met   LONG TERM GOALS: Target date: 09/16/22   Patient will demonstrate at least 160 degrees of Rt shoulder flexion AROM to improve ability to reach overhead at work.  Baseline: see above  Goal status: INITIAL    2.  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  Baseline: unable  Goal status: INITIAL   3.  Patient will score at least 66% on FOTO to signify clinically meaningful improvement in functional abilities.  Baseline: see above  Goal status: INITIAL   4.  Patient will report pain as </=2/10 to reduce her current functional limitations.  Baseline: see above  Goal status: INITIAL     PLAN: PT FREQUENCY: 1-2x/week   PT DURATION: 6 weeks   PLANNED INTERVENTIONS: Therapeutic exercises, Therapeutic activity, Neuromuscular re-education, Patient/Family education, Self Care, Joint mobilization, Dry Needling, Spinal manipulation, Spinal mobilization, Cryotherapy, Moist heat, Traction, Manual therapy, and Re-evaluation   PLAN FOR NEXT SESSION: review and progress HEP prn; TPDN response, manual/TPDN to cervical region, cervical/shoulder mobility, strengthening as tolerated. Repeated movement response.    Rosana Hoes, PT, DPT, LAT, ATC 08/17/22  12:40 PM Phone: [phone] Fax: [phone]

## 2022-08-17 ENCOUNTER — Other Ambulatory Visit: Payer: Self-pay

## 2022-08-17 ENCOUNTER — Encounter: Payer: Self-pay | Admitting: Physical Therapy

## 2022-08-17 ENCOUNTER — Ambulatory Visit: Payer: PRIVATE HEALTH INSURANCE | Attending: Family Medicine | Admitting: Physical Therapy

## 2022-08-17 DIAGNOSIS — M6281 Muscle weakness (generalized): Secondary | ICD-10-CM | POA: Diagnosis present

## 2022-08-17 DIAGNOSIS — R293 Abnormal posture: Secondary | ICD-10-CM | POA: Diagnosis present

## 2022-08-17 DIAGNOSIS — R252 Cramp and spasm: Secondary | ICD-10-CM | POA: Insufficient documentation

## 2022-08-17 DIAGNOSIS — M542 Cervicalgia: Secondary | ICD-10-CM | POA: Insufficient documentation

## 2022-08-17 NOTE — Patient Instructions (Signed)
Access Code: [email or web address] URL: [email or web address] Date: 08/17/2022 Prepared by: Rosana Hoes  Exercises - Supine Chin Tuck  - 2 x daily - 7 x weekly - 2 sets - 10 reps - 5 sec  hold - Seated Upper Trapezius Stretch  - 2 x daily - 7 x weekly - 2 sets - 20 sec  hold - Seated Levator Scapulae Stretch  - 2 x daily - 7 x weekly - 2 sets - 20 sec  hold - Supine Shoulder Flexion Extension AAROM with Dowel  - 2 x daily - 7 x weekly - 1 sets - 10 reps - 5 sec  hold - Seated Cervical Retraction and Extension  - 6 x daily - 7 x weekly - 1 sets - 10 reps - Sidelying Thoracic Lumbar Rotation  - 2 x daily - 7 x weekly - 10 reps

## 2022-08-21 ENCOUNTER — Ambulatory Visit: Payer: PRIVATE HEALTH INSURANCE

## 2022-08-21 DIAGNOSIS — M542 Cervicalgia: Secondary | ICD-10-CM | POA: Diagnosis not present

## 2022-08-21 DIAGNOSIS — M6281 Muscle weakness (generalized): Secondary | ICD-10-CM

## 2022-08-21 DIAGNOSIS — R252 Cramp and spasm: Secondary | ICD-10-CM

## 2022-08-21 DIAGNOSIS — R293 Abnormal posture: Secondary | ICD-10-CM

## 2022-08-21 NOTE — Therapy (Signed)
OUTPATIENT PHYSICAL THERAPY TREATMENT NOTE   Patient Name: Brenda Mcmahon MRN: [id number] DOB:1990-08-06, 32 y.o., female Today's Date: 08/21/2022  PCP: Blane Ohara, MD  REFERRING PROVIDER:   Lanell Persons, MD    END OF SESSION:   PT End of Session - 08/21/22 1412     Visit Number 5    Number of Visits 13    Date for PT Re-Evaluation 09/16/22    Authorization Type Worker's comp    Authorization - Visit Number 5    Authorization - Number of Visits 6    PT Start Time 1412    PT Stop Time 1500    PT Time Calculation (min) 48 min    Activity Tolerance Patient tolerated treatment well    Behavior During Therapy WFL for tasks assessed/performed                Past Medical History:  Diagnosis Date   Palpitation    Past Surgical History:  Procedure Laterality Date   FOOT FRACTURE SURGERY Right 2015   also ankle fracture repaired.    WRIST FRACTURE SURGERY Left    32 yo.  fell through glass door.    Patient Active Problem List   Diagnosis Date Noted   Encounter for general adult medical examination without abnormal findings 09/18/2021   Need for DTaP vaccine 09/18/2021   Moderate recurrent major depression (HCC) 08/07/2021   GAD (generalized anxiety disorder) 08/07/2021   Encounter for other contraceptive management 08/07/2021   Palpitation 11/10/2020   Achilles bursitis of right lower extremity 03/14/2017   Depression, major, single episode, moderate (HCC) 03/14/2017    REFERRING DIAG: W09.81XB (ICD-10-CM) - Contusion of other specified part of neck, subsequent encounter   THERAPY DIAG:  Cervicalgia  Cramp and spasm  Abnormal posture  Muscle weakness (generalized)  Rationale for Evaluation and Treatment Rehabilitation  PERTINENT HISTORY: Work-related injury on 06/30/22   PRECAUTIONS: none    SUBJECTIVE:                                                                                                                                                                                      SUBJECTIVE STATEMENT:  "I'm a little flared up. I had to do something different at work today."   PAIN:  Are you having pain? Yes:  NPRS scale: 5/10 Pain location: Rt scapula and upper thoracic (between T-spine and medial scapular border)  Pain description: sharp, dull  Aggravating factors: arm movement  Relieving factors: heat   OBJECTIVE: (objective measures completed at initial evaluation unless otherwise dated) DIAGNOSTIC FINDINGS:  IMPRESSION: Negative cervical spine radiographs.   PATIENT SURVEYS:  FOTO 57% function to  66% predicted   POSTURE:  Rounded shoulders and forward head   PALPATION: Tautness and palpable tenderness Rt upper trap, cervical paraspinals, middle trap, levator scapulae, rhomboids             CERVICAL ROM:    Active ROM A/PROM (deg) eval 08/14/22  Flexion 42 pn 62 "pulling"   Extension 50 "sore" 50 "pulling"  Right lateral flexion 35 pn   Left lateral flexion 30 pn   Right rotation 80 "pull"   Left rotation 60 pn    (Blank rows = not tested)   UPPER EXTREMITY ROM:   Active ROM Right eval Left eval  Shoulder flexion 145 "sore" Full   Shoulder extension      Shoulder abduction      Shoulder adduction      Shoulder extension      Shoulder internal rotation      Shoulder external rotation      Elbow flexion      Elbow extension      Wrist flexion      Wrist extension      Wrist ulnar deviation      Wrist radial deviation      Wrist pronation      Wrist supination       (Blank rows = not tested)   UPPER EXTREMITY MMT:   MMT Right eval Left eval  Shoulder flexion 4+ pn 5  Shoulder extension      Shoulder abduction 5 5  Shoulder adduction      Shoulder extension      Shoulder internal rotation      Shoulder external rotation      Middle trapezius 4+ pn 4+   Lower trapezius 4- pn 4-   Elbow flexion      Elbow extension      Wrist flexion      Wrist extension      Wrist ulnar deviation       Wrist radial deviation      Wrist pronation      Wrist supination      Grip strength       (Blank rows = not tested)   CERVICAL SPECIAL TESTS:  Cervical compression (-) Cervical distraction  (-)    FUNCTIONAL TESTS:  N/A   TODAY'S TREATMENT [facility] Adult PT Treatment:                                                DATE: 08/21/22 Therapeutic Exercise: Cervical retraction with extension x 10  Seated thoracic extension x 10  Thread the needle x 10 each  Serratus wall slides 2 x 10  Resisted shoulder extension green band 2 x 10  Resisted rows 2 x 10 blue band  Updated HEP     [facility] Adult PT Treatment:                                                DATE: 08/17/22 Therapeutic Exercise: Sidelying thoracic rotation x 5 each Supine thoracic extension over FR at various level  Child's pose thoracic rotation x 5 each Cat cow x 5 Seated cervical retraction with extension x 10  Row with blue x 10 Manual Therapy: Skilled palpation  and monitoring of muscle tension while performing TPDN DTM right upper trap, levator scapulae, cervical paraspinals, rhomboids Trigger Point Dry Needling Treatment: Pre-treatment instruction: Patient instructed on dry needling rationale, procedures, and possible side effects including pain during treatment (achy,cramping feeling), bruising, drop of blood, lightheadedness, nausea, sweating. Patient Consent Given: Yes Education handout provided: Previously provided Muscles treated: Right upper trap and rhomboid/mid trap region Needle size and number: .25x7mm x 2, .30x47mm x 4 Electrical stimulation performed: No Parameters: N/A Treatment response/outcome: Twitch response elicited and Palpable decrease in muscle tension Post-treatment instructions: Patient instructed to expect possible mild to moderate muscle soreness later today and/or tomorrow. Patient instructed in methods to reduce muscle soreness and to continue prescribed HEP. If patient was dry needled  over the lung field, patient was instructed on signs and symptoms of pneumothorax and, however unlikely, to see immediate medical attention should they occur. Patient was also educated on signs and symptoms of infection and to seek medical attention should they occur. Patient verbalized understanding of these instructions and education.   [facility] Adult PT Treatment:                                                DATE: 08/14/22 Therapeutic Exercise: Seated cervical retraction 2 x 10 Seated cervical retraction with extension x 10  Prone scapular retraction 2 x 10  Bilateral shoulder ER red band 2 x 10  Updated HEP  Manual Therapy: DTM Rt upper trap, levator scapulae, cervical paraspinals, rhomboids C2-T4 CPAs grade III Cervical distraction 4 x 30 sec  Trigger Point Dry Needling Treatment: Pre-treatment instruction: Patient instructed on dry needling rationale, procedures, and possible side effects including pain during treatment (achy,cramping feeling), bruising, drop of blood, lightheadedness, nausea, sweating. Patient Consent Given: Yes Education handout provided: Previously provided Muscles treated: Rt upper trap   Needle size and number: .25x58mm x 1 Electrical stimulation performed: No Parameters: N/A Treatment response/outcome: Twitch response elicited and Palpable decrease in muscle tension Post-treatment instructions: Patient instructed to expect possible mild to moderate muscle soreness later today and/or tomorrow. Patient instructed in methods to reduce muscle soreness and to continue prescribed HEP. If patient was dry needled over the lung field, patient was instructed on signs and symptoms of pneumothorax and, however unlikely, to see immediate medical attention should they occur. Patient was also educated on signs and symptoms of infection and to seek medical attention should they occur. Patient verbalized understanding of these instructions and education.     PATIENT EDUCATION:   Education details: HEP update Person educated: Patient Education method: Explanation, demo, cues, handout Education comprehension: verbalized understanding, returned demo, cues    HOME EXERCISE PROGRAM: Access Code: [email or web address]    ASSESSMENT: CLINICAL IMPRESSION: Patient tolerated therapy well with no adverse effects. 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 With repeated retraction with extension her pain does centralize, but does not fully resolve. Continued with thoracic mobility and periscapular strengthening with good tolerance. With thread the needle she reports feeling stuck when rotating to the Rt, but this did not cause more pain. Occasional cues required to reduce forward shoulders with standing postural strengthening. HEP was updated to include further strengthening. She reported slight improvement in her pain at conclusion of session.     OBJECTIVE IMPAIRMENTS: decreased activity tolerance, decreased knowledge  of condition, decreased mobility, decreased ROM, decreased strength, increased fascial restrictions, impaired flexibility, impaired UE functional use, improper body mechanics, postural dysfunction, and pain.    ACTIVITY LIMITATIONS: carrying, lifting, and reach over head   PARTICIPATION LIMITATIONS: meal prep, cleaning, laundry, driving, shopping, occupation, and yard work   PERSONAL FACTORS: Age, Profession, and Time since onset of injury/illness/exacerbation are also affecting patient's functional outcome.      GOALS: Goals reviewed with patient? Yes   SHORT TERM GOALS: Target date: 08/21/2022   Patient will be independent and compliant with initial HEP.    Baseline: issued at eval  Goal status: met   2.  Patient will demonstrate at least 70 degrees of Lt cervical rotation AROM to improve ability to complete head turns while driving.  Baseline: see above  Goal status: INITIAL   3.  Patient will  demonstrate at least 50 degrees of cervical flexion AROM to improve tolerance to reading and work tasks.  Baseline: see above  Goal status: met   LONG TERM GOALS: Target date: 09/16/22   Patient will demonstrate at least 160 degrees of Rt shoulder flexion AROM to improve ability to reach overhead at work.  Baseline: see above  Goal status: INITIAL   2.  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  Baseline: unable  Goal status: INITIAL   3.  Patient will score at least 66% on FOTO to signify clinically meaningful improvement in functional abilities.  Baseline: see above  Goal status: INITIAL   4.  Patient will report pain as </=2/10 to reduce her current functional limitations.  Baseline: see above  Goal status: INITIAL     PLAN: PT FREQUENCY: 1-2x/week   PT DURATION: 6 weeks   PLANNED INTERVENTIONS: Therapeutic exercises, Therapeutic activity, Neuromuscular re-education, Patient/Family education, Self Care, Joint mobilization, Dry Needling, Spinal manipulation, Spinal mobilization, Cryotherapy, Moist heat, Traction, Manual therapy, and Re-evaluation   PLAN FOR NEXT SESSION: review and progress HEP prn; TPDN response, manual/TPDN to cervical region, cervical/shoulder mobility, strengthening as tolerated. Repeated movement response. Request additional Ward Chatters, PT, DPT, ATC 08/21/22 3:02 PM

## 2022-08-24 ENCOUNTER — Encounter: Payer: Self-pay | Admitting: Physical Therapy

## 2022-08-24 ENCOUNTER — Other Ambulatory Visit: Payer: Self-pay

## 2022-08-24 ENCOUNTER — Ambulatory Visit: Payer: PRIVATE HEALTH INSURANCE | Admitting: Physical Therapy

## 2022-08-24 DIAGNOSIS — R252 Cramp and spasm: Secondary | ICD-10-CM

## 2022-08-24 DIAGNOSIS — R293 Abnormal posture: Secondary | ICD-10-CM

## 2022-08-24 DIAGNOSIS — M542 Cervicalgia: Secondary | ICD-10-CM | POA: Diagnosis not present

## 2022-08-24 DIAGNOSIS — M6281 Muscle weakness (generalized): Secondary | ICD-10-CM

## 2022-08-24 NOTE — Therapy (Signed)
OUTPATIENT PHYSICAL THERAPY TREATMENT NOTE   Patient Name: Brenda Mcmahon MRN: [id number] DOB:Jan 29, 1991, 32 y.o., female Today's Date: 08/24/2022  PCP: Blane Ohara, MD  REFERRING PROVIDER:   Lanell Persons, MD    END OF SESSION:   PT End of Session - 08/24/22 1040     Visit Number 6    Number of Visits 13    Date for PT Re-Evaluation 09/16/22    Authorization Type Worker's comp    Authorization - Visit Number 6    Authorization - Number of Visits 6    PT Start Time 1015    PT Stop Time 1100    PT Time Calculation (min) 45 min    Activity Tolerance Patient tolerated treatment well    Behavior During Therapy WFL for tasks assessed/performed                 Past Medical History:  Diagnosis Date   Palpitation    Past Surgical History:  Procedure Laterality Date   FOOT FRACTURE SURGERY Right 2015   also ankle fracture repaired.    WRIST FRACTURE SURGERY Left    32 yo.  fell through glass door.    Patient Active Problem List   Diagnosis Date Noted   Encounter for general adult medical examination without abnormal findings 09/18/2021   Need for DTaP vaccine 09/18/2021   Moderate recurrent major depression (HCC) 08/07/2021   GAD (generalized anxiety disorder) 08/07/2021   Encounter for other contraceptive management 08/07/2021   Palpitation 11/10/2020   Achilles bursitis of right lower extremity 03/14/2017   Depression, major, single episode, moderate (HCC) 03/14/2017    REFERRING DIAG: H08.65HQ (ICD-10-CM) - Contusion of other specified part of neck, subsequent encounter   THERAPY DIAG:  Cervicalgia  Cramp and spasm  Abnormal posture  Muscle weakness (generalized)  Rationale for Evaluation and Treatment Rehabilitation  PERTINENT HISTORY: Work-related injury on 06/30/22   PRECAUTIONS: none    SUBJECTIVE:                                                                                                                                                                                      SUBJECTIVE STATEMENT:  Patient reports her upper back/shoulder region was a little flared up last visit but is having more pain this visit, states a tad bit worse. She feels more tight this visit, compared last visit. She did feel great after needling last time.   PAIN:  Are you having pain? Yes:  NPRS scale: 5/10 Pain location: Right neck/scapula and thoracic (between T-spine and medial scapular border)  Pain description: sharp, dull  Aggravating factors: arm movement  Relieving factors: heat  OBJECTIVE: (objective measures completed at initial evaluation unless otherwise dated) DIAGNOSTIC FINDINGS:  IMPRESSION: Negative cervical spine radiographs.   PATIENT SURVEYS:  FOTO 57% function to 66% predicted   POSTURE:  Rounded shoulders and forward head   PALPATION: Tautness and palpable tenderness Rt upper trap, cervical paraspinals, middle trap, levator scapulae, rhomboids             CERVICAL ROM:    Active ROM A/PROM (deg) eval 08/14/22 08/24/2022  Flexion 42 pn 62 "pulling"    Extension 50 "sore" 50 "pulling"   Right lateral flexion 35 pn    Left lateral flexion 30 pn    Right rotation 80 "pull"    Left rotation 60 pn  65   (Blank rows = not tested)   UPPER EXTREMITY ROM:   Active ROM Right eval Left eval  Shoulder flexion 145 "sore" Full   Shoulder extension      Shoulder abduction      Shoulder adduction      Shoulder extension      Shoulder internal rotation      Shoulder external rotation      Elbow flexion      Elbow extension      Wrist flexion      Wrist extension      Wrist ulnar deviation      Wrist radial deviation      Wrist pronation      Wrist supination       (Blank rows = not tested)   UPPER EXTREMITY MMT:   MMT Right eval Left eval Right 08/24/2022  Shoulder flexion 4+ pn 5   Shoulder extension       Shoulder abduction 5 5   Shoulder adduction       Shoulder extension       Shoulder internal  rotation       Shoulder external rotation       Middle trapezius 4+ pn 4+  4  Lower trapezius 4- pn 4-  4-  Elbow flexion       Elbow extension       Wrist flexion       Wrist extension       Wrist ulnar deviation       Wrist radial deviation       Wrist pronation       Wrist supination       Grip strength        (Blank rows = not tested)   CERVICAL SPECIAL TESTS:  Cervical compression (-) Cervical distraction  (-)    FUNCTIONAL TESTS:  N/A   TODAY'S TREATMENT [facility] Adult PT Treatment:                                                DATE: 08/24/22 Therapeutic Exercise: UBE L1 x 4 min (fwd/bwd) while taking subjective Sidelying thoracic rotation x 10 each Quadruped thread the needle with FR x 5 each Prone I and W 2 x 5 each Seated cervical retraction with extension x 10  Manual Therapy: Skilled palpation and monitoring of muscle tension while performing TPDN DTM right upper trap, levator scapulae, cervical paraspinals, rhomboids Trigger Point Dry Needling Treatment: Pre-treatment instruction: Patient instructed on dry needling rationale, procedures, and possible side effects including pain during treatment (achy,cramping feeling), bruising, drop of blood, lightheadedness, nausea, sweating. Patient Consent  Given: Yes Education handout provided: Previously provided Muscles treated: Right upper trap and rhomboid/mid trap region Needle size and number: .30x23mm x 6 Electrical stimulation performed: No Parameters: N/A Treatment response/outcome: Twitch response elicited and Palpable decrease in muscle tension Post-treatment instructions: Patient instructed to expect possible mild to moderate muscle soreness later today and/or tomorrow. Patient instructed in methods to reduce muscle soreness and to continue prescribed HEP. If patient was dry needled over the lung field, patient was instructed on signs and symptoms of pneumothorax and, however unlikely, to see immediate medical  attention should they occur. Patient was also educated on signs and symptoms of infection and to seek medical attention should they occur. Patient verbalized understanding of these instructions and education.   [facility] Adult PT Treatment:                                                DATE: 08/21/22 Therapeutic Exercise: Cervical retraction with extension x 10  Seated thoracic extension x 10  Thread the needle x 10 each  Serratus wall slides 2 x 10  Resisted shoulder extension green band 2 x 10  Resisted rows 2 x 10 blue band  Updated HEP   [facility] Adult PT Treatment:                                                DATE: 08/17/22 Therapeutic Exercise: Sidelying thoracic rotation x 5 each Supine thoracic extension over FR at various level  Child's pose thoracic rotation x 5 each Cat cow x 5 Seated cervical retraction with extension x 10  Row with blue x 10 Manual Therapy: Skilled palpation and monitoring of muscle tension while performing TPDN DTM right upper trap, levator scapulae, cervical paraspinals, rhomboids Trigger Point Dry Needling Treatment: Pre-treatment instruction: Patient instructed on dry needling rationale, procedures, and possible side effects including pain during treatment (achy,cramping feeling), bruising, drop of blood, lightheadedness, nausea, sweating. Patient Consent Given: Yes Education handout provided: Previously provided Muscles treated: Right upper trap and rhomboid/mid trap region Needle size and number: .25x56mm x 2, .30x63mm x 4 Electrical stimulation performed: No Parameters: N/A Treatment response/outcome: Twitch response elicited and Palpable decrease in muscle tension Post-treatment instructions: Patient instructed to expect possible mild to moderate muscle soreness later today and/or tomorrow. Patient instructed in methods to reduce muscle soreness and to continue prescribed HEP. If patient was dry needled over the lung field, patient was instructed on signs  and symptoms of pneumothorax and, however unlikely, to see immediate medical attention should they occur. Patient was also educated on signs and symptoms of infection and to seek medical attention should they occur. Patient verbalized understanding of these instructions and education.    PATIENT EDUCATION:  Education details: HEP Person educated: Patient Education method: Programmer, multimedia, demo, cues Education comprehension: verbalized understanding, returned demo, cues    HOME EXERCISE PROGRAM: Access Code: [email or web address]    ASSESSMENT: CLINICAL IMPRESSION: Patient tolerated therapy well with no adverse effects. She arrived reporting increased pain and tightness this visit stating that work related tasks will flare up her right neck and scapular/thoracic pain. She remains limited with her cervical motion and postural strength/endurance. Continued with TPDN for the right upper trap and rhomboid region, and performed thoracic  thrust manipulation with good tolerance. Therapy focused on cervical and thoracic mobility, and progressing postural strengthening. She did report right upper trap discomfort and stating her right shoulder felt fatigued with exercises. No changes made to her HEP this visit. Patient would benefit from continued skilled PT to progress her mobility and strength in order to reduce pain and maximize functional ability.     OBJECTIVE IMPAIRMENTS: decreased activity tolerance, decreased knowledge of condition, decreased mobility, decreased ROM, decreased strength, increased fascial restrictions, impaired flexibility, impaired UE functional use, improper body mechanics, postural dysfunction, and pain.    ACTIVITY LIMITATIONS: carrying, lifting, and reach over head   PARTICIPATION LIMITATIONS: meal prep, cleaning, laundry, driving, shopping, occupation, and yard work   PERSONAL FACTORS: Age, Profession, and Time since onset of injury/illness/exacerbation are also affecting patient's  functional outcome.      GOALS: Goals reviewed with patient? Yes   SHORT TERM GOALS: Target date: 08/21/2022   Patient will be independent and compliant with initial HEP.    Baseline: issued at eval  Goal status: met   2.  Patient will demonstrate at least 70 degrees of Lt cervical rotation AROM to improve ability to complete head turns while driving.  Baseline: see above  08/24/2022: 64 deg Goal status: partially met   3.  Patient will demonstrate at least 50 degrees of cervical flexion AROM to improve tolerance to reading and work tasks.  Baseline: see above  Goal status: met   LONG TERM GOALS: Target date: 09/16/22   Patient will demonstrate at least 160 degrees of Rt shoulder flexion AROM to improve ability to reach overhead at work.  Baseline: see above  Goal status: INITIAL   2.  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  Baseline: unable  Goal status: INITIAL   3.  Patient will score at least 66% on FOTO to signify clinically meaningful improvement in functional abilities.  Baseline: see above  Goal status: INITIAL   4.  Patient will report pain as </=2/10 to reduce her current functional limitations.  Baseline: see above  Goal status: INITIAL     PLAN: PT FREQUENCY: 1-2x/week   PT DURATION: 6 weeks   PLANNED INTERVENTIONS: Therapeutic exercises, Therapeutic activity, Neuromuscular re-education, Patient/Family education, Self Care, Joint mobilization, Dry Needling, Spinal manipulation, Spinal mobilization, Cryotherapy, Moist heat, Traction, Manual therapy, and Re-evaluation   PLAN FOR NEXT SESSION: review and progress HEP prn; TPDN response, manual/TPDN to cervical region, cervical/shoulder mobility, strengthening as tolerated. Repeated movement response   Rosana Hoes, PT, DPT, LAT, ATC 08/24/22  12:04 PM Phone: [phone] Fax:  [phone]

## 2022-08-28 ENCOUNTER — Ambulatory Visit: Payer: PRIVATE HEALTH INSURANCE

## 2022-08-29 ENCOUNTER — Telehealth: Payer: Self-pay

## 2022-08-29 NOTE — Telephone Encounter (Signed)
LVM notifying patient that we have been unable to contact her case manager regarding authorization of additional PT visits. Advised her to reach out to her case manager regarding this matter as we are unable to continue with PT without further authorization. Asked that she call us back to discuss further.   Letitia Libra, PT, DPT, ATC 08/29/22 12:51 PM

## 2022-08-30 NOTE — Therapy (Signed)
OUTPATIENT PHYSICAL THERAPY TREATMENT NOTE   Patient Name: Brenda Mcmahon MRN: [id number] DOB:05/24/1990, 32 y.o., female Today's Date: 08/31/2022  PCP: Blane Ohara, MD  REFERRING PROVIDER:   Lanell Persons, MD    END OF SESSION:   PT End of Session - 08/31/22 1621     Visit Number 7    Number of Visits 13    Date for PT Re-Evaluation 09/16/22    Authorization Type Worker's comp 5/16- 6 new visits approved    Authorization - Visit Number 7    Authorization - Number of Visits 12    PT Start Time 1550    PT Stop Time 1630    PT Time Calculation (min) 40 min    Activity Tolerance Patient tolerated treatment well    Behavior During Therapy WFL for tasks assessed/performed                  Past Medical History:  Diagnosis Date   Palpitation    Past Surgical History:  Procedure Laterality Date   FOOT FRACTURE SURGERY Right 2015   also ankle fracture repaired.    WRIST FRACTURE SURGERY Left    32 yo.  fell through glass door.    Patient Active Problem List   Diagnosis Date Noted   Encounter for general adult medical examination without abnormal findings 09/18/2021   Need for DTaP vaccine 09/18/2021   Moderate recurrent major depression (HCC) 08/07/2021   GAD (generalized anxiety disorder) 08/07/2021   Encounter for other contraceptive management 08/07/2021   Palpitation 11/10/2020   Achilles bursitis of right lower extremity 03/14/2017   Depression, major, single episode, moderate (HCC) 03/14/2017    REFERRING DIAG: Q65.78IO (ICD-10-CM) - Contusion of other specified part of neck, subsequent encounter   THERAPY DIAG:  Cervicalgia  Cramp and spasm  Abnormal posture  Muscle weakness (generalized)  Rationale for Evaluation and Treatment Rehabilitation  PERTINENT HISTORY: Work-related injury on 06/30/22   PRECAUTIONS: none    SUBJECTIVE:                                                                                                                                                                                      SUBJECTIVE STATEMENT:  Patient reports her upper back/shoulder region is aggravated after 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 Overall, pt report improvement in R neck and upper shoulder pain.  PAIN:  Are you having pain? Yes:  NPRS scale: 5/10 Pain location: Right neck/scapula and thoracic (between T-spine and medial scapular border)  Pain description: sharp, dull  Aggravating factors: arm movement  Relieving factors: heat   OBJECTIVE: (objective measures  completed at initial evaluation unless otherwise dated) DIAGNOSTIC FINDINGS:  IMPRESSION: Negative cervical spine radiographs.   PATIENT SURVEYS:  FOTO 57% function to 66% predicted   POSTURE:  Rounded shoulders and forward head   PALPATION: Tautness and palpable tenderness Rt upper trap, cervical paraspinals, middle trap, levator scapulae, rhomboids             CERVICAL ROM:    Active ROM A/PROM (deg) eval 08/14/22 08/24/2022 08/31/22  Flexion 42 pn 62 "pulling"     Extension 50 "sore" 50 "pulling"    Right lateral flexion 35 pn   50  Left lateral flexion 30 pn   50  Right rotation 80 "pull"   80  Left rotation 60 pn  65 65   (Blank rows = not tested)   UPPER EXTREMITY ROM:   Active ROM Right eval Left eval  Shoulder flexion 145 "sore" Full   Shoulder extension      Shoulder abduction      Shoulder adduction      Shoulder extension      Shoulder internal rotation      Shoulder external rotation      Elbow flexion      Elbow extension      Wrist flexion      Wrist extension      Wrist ulnar deviation      Wrist radial deviation      Wrist pronation      Wrist supination       (Blank rows = not tested)   UPPER EXTREMITY MMT:   MMT Right eval Left eval Right 08/24/2022  Shoulder flexion 4+ pn 5   Shoulder extension       Shoulder abduction 5 5   Shoulder adduction       Shoulder extension        Shoulder internal rotation       Shoulder external rotation       Middle trapezius 4+ pn 4+  4  Lower trapezius 4- pn 4-  4-  Elbow flexion       Elbow extension       Wrist flexion       Wrist extension       Wrist ulnar deviation       Wrist radial deviation       Wrist pronation       Wrist supination       Grip strength        (Blank rows = not tested)   CERVICAL SPECIAL TESTS:  Cervical compression (-) Cervical distraction  (-)    FUNCTIONAL TESTS:  N/A   TODAY'S TREATMENT  [facility] Adult PT Treatment:                                                DATE: 08/31/22 Therapeutic Exercise: Supine chin tucks x5 5" Neck liftoffs x5 5" Cervical rotation c gentle pt overpressure Upper trap strech Chin tuck c sidebending  Manual Therapy: STM/DTM to the R cervical and thoracic paraspinals and interscapular muscles c MPTR Cervical traction Suboccital realease  [facility] Adult PT Treatment:  DATE: 08/24/22 Therapeutic Exercise: UBE L1 x 4 min (fwd/bwd) while taking subjective Sidelying thoracic rotation x 10 each Quadruped thread the needle with FR x 5 each Prone I and W 2 x 5 each Seated cervical retraction with extension x 10  Manual Therapy: Skilled palpation and monitoring of muscle tension while performing TPDN DTM right upper trap, levator scapulae, cervical paraspinals, rhomboids Trigger Point Dry Needling Treatment: Pre-treatment instruction: Patient instructed on dry needling rationale, procedures, and possible side effects including pain during treatment (achy,cramping feeling), bruising, drop of blood, lightheadedness, nausea, sweating. Patient Consent Given: Yes Education handout provided: Previously provided Muscles treated: Right upper trap and rhomboid/mid trap region Needle size and number: .30x80mm x 6 Electrical stimulation performed: No Parameters: N/A Treatment response/outcome: Twitch response elicited and  Palpable decrease in muscle tension Post-treatment instructions: Patient instructed to expect possible mild to moderate muscle soreness later today and/or tomorrow. Patient instructed in methods to reduce muscle soreness and to continue prescribed HEP. If patient was dry needled over the lung field, patient was instructed on signs and symptoms of pneumothorax and, however unlikely, to see immediate medical attention should they occur. Patient was also educated on signs and symptoms of infection and to seek medical attention should they occur. Patient verbalized understanding of these instructions and education.   [facility] Adult PT Treatment:                                                DATE: 08/21/22 Therapeutic Exercise: Cervical retraction with extension x 10  Seated thoracic extension x 10  Thread the needle x 10 each  Serratus wall slides 2 x 10  Resisted shoulder extension green band 2 x 10  Resisted rows 2 x 10 blue band  Updated HEP   [facility] Adult PT Treatment:                                                DATE: 08/17/22 Therapeutic Exercise: Sidelying thoracic rotation x 5 each Supine thoracic extension over FR at various level  Child's pose thoracic rotation x 5 each Cat cow x 5 Seated cervical retraction with extension x 10  Row with blue x 10 Manual Therapy: Skilled palpation and monitoring of muscle tension while performing TPDN DTM right upper trap, levator scapulae, cervical paraspinals, rhomboids Trigger Point Dry Needling Treatment: Pre-treatment instruction: Patient instructed on dry needling rationale, procedures, and possible side effects including pain during treatment (achy,cramping feeling), bruising, drop of blood, lightheadedness, nausea, sweating. Patient Consent Given: Yes Education handout provided: Previously provided Muscles treated: Right upper trap and rhomboid/mid trap region Needle size and number: .25x61mm x 2, .30x38mm x 4 Electrical stimulation performed:  No Parameters: N/A Treatment response/outcome: Twitch response elicited and Palpable decrease in muscle tension Post-treatment instructions: Patient instructed to expect possible mild to moderate muscle soreness later today and/or tomorrow. Patient instructed in methods to reduce muscle soreness and to continue prescribed HEP. If patient was dry needled over the lung field, patient was instructed on signs and symptoms of pneumothorax and, however unlikely, to see immediate medical attention should they occur. Patient was also educated on signs and symptoms of infection and to seek medical attention should they occur. Patient verbalized understanding of  these instructions and education.    PATIENT EDUCATION:  Education details: HEP Person educated: Patient Education method: Programmer, multimedia, demo, cues Education comprehension: verbalized understanding, returned demo, cues    HOME EXERCISE PROGRAM: Access Code: [email or web address]    ASSESSMENT: CLINICAL IMPRESSION: After the PT session, pt demonstrated improved cervical sidebending. With manual therapy, increased areas of muscle tautness were palpated of the R subocciitals, upper trap, and interscapular muscles. With L cervical side bending, pt reported tightness of the R suboccipital region. Pt my benefit from TPDN to this area. Pt tolerated PT today without adverse effects. Pt will continue to benefit from skilled PT to address impairments for improved neck/R UE function with less pain.  OBJECTIVE IMPAIRMENTS: decreased activity tolerance, decreased knowledge of condition, decreased mobility, decreased ROM, decreased strength, increased fascial restrictions, impaired flexibility, impaired UE functional use, improper body mechanics, postural dysfunction, and pain.    ACTIVITY LIMITATIONS: carrying, lifting, and reach over head   PARTICIPATION LIMITATIONS: meal prep, cleaning, laundry, driving, shopping, occupation, and yard work   PERSONAL FACTORS: Age,  Profession, and Time since onset of injury/illness/exacerbation are also affecting patient's functional outcome.      GOALS: Goals reviewed with patient? Yes   SHORT TERM GOALS: Target date: 08/21/2022   Patient will be independent and compliant with initial HEP.    Baseline: issued at eval  Goal status: met   2.  Patient will demonstrate at least 70 degrees of Lt cervical rotation AROM to improve ability to complete head turns while driving.  Baseline: see above  08/24/2022: 64 deg 08/31/22: see flow sheets Goal status: partially met   3.  Patient will demonstrate at least 50 degrees of cervical flexion AROM to improve tolerance to reading and work tasks.  Baseline: see above  Goal status: met   LONG TERM GOALS: Target date: 09/16/22   Patient will demonstrate at least 160 degrees of Rt shoulder flexion AROM to improve ability to reach overhead at work.  Baseline: see above  Goal status: INITIAL   2.  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  Baseline: unable  Goal status: INITIAL   3.  Patient will score at least 66% on FOTO to signify clinically meaningful improvement in functional abilities.  Baseline: see above  Goal status: INITIAL   4.  Patient will report pain as </=2/10 to reduce her current functional limitations.  Baseline: see above  Goal status: INITIAL     PLAN: PT FREQUENCY: 1-2x/week   PT DURATION: 6 weeks   PLANNED INTERVENTIONS: Therapeutic exercises, Therapeutic activity, Neuromuscular re-education, Patient/Family education, Self Care, Joint mobilization, Dry Needling, Spinal manipulation, Spinal mobilization, Cryotherapy, Moist heat, Traction, Manual therapy, and Re-evaluation   PLAN FOR NEXT SESSION: review and progress HEP prn; TPDN response, manual/TPDN to cervical region, cervical/shoulder mobility, strengthening as tolerated. Repeated  movement response. Consider TPDN to the R suboccipitals   Joellyn Rued MS, PT 08/31/22 6:19 PM

## 2022-08-31 ENCOUNTER — Ambulatory Visit: Payer: PRIVATE HEALTH INSURANCE | Attending: Family Medicine

## 2022-08-31 ENCOUNTER — Ambulatory Visit: Payer: PRIVATE HEALTH INSURANCE | Admitting: Physical Therapy

## 2022-08-31 DIAGNOSIS — M6281 Muscle weakness (generalized): Secondary | ICD-10-CM | POA: Insufficient documentation

## 2022-08-31 DIAGNOSIS — R252 Cramp and spasm: Secondary | ICD-10-CM | POA: Insufficient documentation

## 2022-08-31 DIAGNOSIS — R293 Abnormal posture: Secondary | ICD-10-CM | POA: Diagnosis present

## 2022-08-31 DIAGNOSIS — M542 Cervicalgia: Secondary | ICD-10-CM | POA: Diagnosis present

## 2022-09-05 ENCOUNTER — Encounter: Payer: Self-pay | Admitting: Physical Therapy

## 2022-09-05 ENCOUNTER — Ambulatory Visit: Payer: PRIVATE HEALTH INSURANCE | Admitting: Physical Therapy

## 2022-09-05 DIAGNOSIS — M542 Cervicalgia: Secondary | ICD-10-CM | POA: Diagnosis not present

## 2022-09-05 DIAGNOSIS — M6281 Muscle weakness (generalized): Secondary | ICD-10-CM

## 2022-09-05 DIAGNOSIS — R293 Abnormal posture: Secondary | ICD-10-CM

## 2022-09-05 DIAGNOSIS — R252 Cramp and spasm: Secondary | ICD-10-CM

## 2022-09-05 NOTE — Therapy (Signed)
OUTPATIENT PHYSICAL THERAPY TREATMENT NOTE   Patient Name: Brenda Mcmahon MRN: [id number] DOB:1991-03-20, 32 y.o., female Today's Date: 09/05/2022  PCP: Blane Ohara, MD  REFERRING PROVIDER:   Lanell Persons, MD    END OF SESSION:   PT End of Session - 09/05/22 1500     Visit Number 8    Number of Visits 13    Date for PT Re-Evaluation 09/16/22    Authorization Type Worker's comp 5/16- 6 new visits approved    Authorization - Visit Number 8    Authorization - Number of Visits 12    PT Start Time 1500    PT Stop Time 1545    PT Time Calculation (min) 45 min    Activity Tolerance Patient tolerated treatment well    Behavior During Therapy WFL for tasks assessed/performed                  Past Medical History:  Diagnosis Date   Palpitation    Past Surgical History:  Procedure Laterality Date   FOOT FRACTURE SURGERY Right 2015   also ankle fracture repaired.    WRIST FRACTURE SURGERY Left    32 yo.  fell through glass door.    Patient Active Problem List   Diagnosis Date Noted   Encounter for general adult medical examination without abnormal findings 09/18/2021   Need for DTaP vaccine 09/18/2021   Moderate recurrent major depression (HCC) 08/07/2021   GAD (generalized anxiety disorder) 08/07/2021   Encounter for other contraceptive management 08/07/2021   Palpitation 11/10/2020   Achilles bursitis of right lower extremity 03/14/2017   Depression, major, single episode, moderate (HCC) 03/14/2017    REFERRING DIAG: W09.81XB (ICD-10-CM) - Contusion of other specified part of neck, subsequent encounter   THERAPY DIAG:  Cervicalgia  Cramp and spasm  Abnormal posture  Muscle weakness (generalized)  Rationale for Evaluation and Treatment Rehabilitation  PERTINENT HISTORY: Work-related injury on 06/30/22   PRECAUTIONS: none    SUBJECTIVE:                                                                                                                                                                                      SUBJECTIVE STATEMENT:  Pt reports 8/10 and she thought about cancelling because she feels so bad!  PAIN:  Are you having pain? Yes:  NPRS scale: 5/10 Pain location: Right neck/scapula and thoracic (between T-spine and medial scapular border)  Pain description: sharp, dull  Aggravating factors: arm movement  Relieving factors: heat   OBJECTIVE: (objective measures completed at initial evaluation unless otherwise dated) DIAGNOSTIC FINDINGS:  IMPRESSION: Negative cervical spine radiographs.   PATIENT SURVEYS:  FOTO 57% function to 66% predicted   POSTURE:  Rounded shoulders and forward head   PALPATION: Tautness and palpable tenderness Rt upper trap, cervical paraspinals, middle trap, levator scapulae, rhomboids             CERVICAL ROM:    Active ROM A/PROM (deg) eval 08/14/22 08/24/2022 08/31/22  Flexion 42 pn 62 "pulling"     Extension 50 "sore" 50 "pulling"    Right lateral flexion 35 pn   50  Left lateral flexion 30 pn   50  Right rotation 80 "pull"   80  Left rotation 60 pn  65 65   (Blank rows = not tested)   UPPER EXTREMITY ROM:   Active ROM Right eval Left eval  Shoulder flexion 145 "sore" Full   Shoulder extension      Shoulder abduction      Shoulder adduction      Shoulder extension      Shoulder internal rotation      Shoulder external rotation      Elbow flexion      Elbow extension      Wrist flexion      Wrist extension      Wrist ulnar deviation      Wrist radial deviation      Wrist pronation      Wrist supination       (Blank rows = not tested)   UPPER EXTREMITY MMT:   MMT Right eval Left eval Right 08/24/2022  Shoulder flexion 4+ pn 5   Shoulder extension       Shoulder abduction 5 5   Shoulder adduction       Shoulder extension       Shoulder internal rotation       Shoulder external rotation       Middle trapezius 4+ pn 4+  4  Lower trapezius 4- pn 4-  4-   Elbow flexion       Elbow extension       Wrist flexion       Wrist extension       Wrist ulnar deviation       Wrist radial deviation       Wrist pronation       Wrist supination       Grip strength        (Blank rows = not tested)   CERVICAL SPECIAL TESTS:  Cervical compression (-) Cervical distraction  (-)    FUNCTIONAL TESTS:  N/A   TODAY'S TREATMENT [facility] Adult PT Treatment:                                                DATE: 09-05-22 Therapeutic Exercise: Supine chin tucks x5 5" Neck liftoffs x5 5" Sidelying thoracic book opening. Pillow between knees 1 x 10 on R and L Manual Therapy: Skilled palpation and monitoring of muscle tension while performing TPDN STW right upper trap, levator scapulae, cervical paraspinals, rhomboids Prone UPA on R C-3,4, 5 Suboccital realease bil Resisted L sidelying ER by PT Seated cervical distraction with cavitation. Trigger Point Dry Needling Treatment: Pre-treatment instruction: Patient instructed on dry needling rationale, procedures, and possible side effects including pain during treatment (achy,cramping feeling), bruising, drop of blood, lightheadedness, nausea, sweating. Patient Consent Given: Yes Education handout provided: Previously provided Muscles treated: Right upper trap and  rhomboid/mid trap region, R sub scapularis R scalenes , C-3,4, 5 bil suboccipital Needle size and number: .30x46mm x 6 Electrical stimulation performed: No Parameters: N/A Treatment response/outcome: Twitch response elicited and Palpable decrease in muscle tension Post-treatment instructions: Patient instructed to expect possible mild to moderate muscle soreness later today and/or tomorrow. Patient instructed in methods to reduce muscle soreness and to continue prescribed HEP. If patient was dry needled over the lung field, patient was instructed on signs and symptoms of pneumothorax and, however unlikely, to see immediate medical attention should they  occur. Patient was also educated on signs and symptoms of infection and to seek medical attention should they occur. Patient verbalized understanding of these instructions and education.   [facility] Adult PT Treatment:                                                DATE: 08/31/22 Therapeutic Exercise: Supine chin tucks x5 5" Neck liftoffs x5 5" Cervical rotation c gentle pt overpressure Upper trap strech Chin tuck c sidebending  Manual Therapy: STM/DTM to the R cervical and thoracic paraspinals and interscapular muscles c MPTR Cervical traction Suboccital realease  [facility] Adult PT Treatment:                                                DATE: 08/24/22 Therapeutic Exercise: UBE L1 x 4 min (fwd/bwd) while taking subjective Sidelying thoracic rotation x 10 each Quadruped thread the needle with FR x 5 each Prone I and W 2 x 5 each Seated cervical retraction with extension x 10  Manual Therapy: Skilled palpation and monitoring of muscle tension while performing TPDN DTM right upper trap, levator scapulae, cervical paraspinals, rhomboids Trigger Point Dry Needling Treatment: Pre-treatment instruction: Patient instructed on dry needling rationale, procedures, and possible side effects including pain during treatment (achy,cramping feeling), bruising, drop of blood, lightheadedness, nausea, sweating. Patient Consent Given: Yes Education handout provided: Previously provided Muscles treated: Right upper trap and rhomboid/mid trap region Needle size and number: .30x67mm x 6 Electrical stimulation performed: No Parameters: N/A Treatment response/outcome: Twitch response elicited and Palpable decrease in muscle tension Post-treatment instructions: Patient instructed to expect possible mild to moderate muscle soreness later today and/or tomorrow. Patient instructed in methods to reduce muscle soreness and to continue prescribed HEP. If patient was dry needled over the lung field, patient was instructed  on signs and symptoms of pneumothorax and, however unlikely, to see immediate medical attention should they occur. Patient was also educated on signs and symptoms of infection and to seek medical attention should they occur. Patient verbalized understanding of these instructions and education.   [facility] Adult PT Treatment:                                                DATE: 08/21/22 Therapeutic Exercise: Cervical retraction with extension x 10  Seated thoracic extension x 10  Thread the needle x 10 each  Serratus wall slides 2 x 10  Resisted shoulder extension green band 2 x 10  Resisted rows 2 x 10 blue band  Updated HEP  [facility] Adult PT Treatment:                                                DATE: 08/17/22 Therapeutic Exercise: Sidelying thoracic rotation x 5 each Supine thoracic extension over FR at various level  Child's pose thoracic rotation x 5 each Cat cow x 5 Seated cervical retraction with extension x 10  Row with blue x 10 Manual Therapy: Skilled palpation and monitoring of muscle tension while performing TPDN DTM right upper trap, levator scapulae, cervical paraspinals, rhomboids Trigger Point Dry Needling Treatment: Pre-treatment instruction: Patient instructed on dry needling rationale, procedures, and possible side effects including pain during treatment (achy,cramping feeling), bruising, drop of blood, lightheadedness, nausea, sweating. Patient Consent Given: Yes Education handout provided: Previously provided Muscles treated: Right upper trap and rhomboid/mid trap region Needle size and number: .25x7mm x 2, .30x97mm x 4 Electrical stimulation performed: No Parameters: N/A Treatment response/outcome: Twitch response elicited and Palpable decrease in muscle tension Post-treatment instructions: Patient instructed to expect possible mild to moderate muscle soreness later today and/or tomorrow. Patient instructed in methods to reduce muscle soreness and to continue prescribed  HEP. If patient was dry needled over the lung field, patient was instructed on signs and symptoms of pneumothorax and, however unlikely, to see immediate medical attention should they occur. Patient was also educated on signs and symptoms of infection and to seek medical attention should they occur. Patient verbalized understanding of these instructions and education.    PATIENT EDUCATION:  Education details: HEP Person educated: Patient Education method: Programmer, multimedia, demo, cues Education comprehension: verbalized understanding, returned demo, cues    HOME EXERCISE PROGRAM: Access Code: [email or web address]    ASSESSMENT: CLINICAL IMPRESSION: After the PT session, pt demonstrated decreased muscle tension in neck and upper back.  Pt did enters with 8/10 headache pain and responded with multiple marked twitch responses to TPDN.  Pt with taut R C-3,4 and responded to manual PT and MET with cavitation to cervical spine.  With L cervical side bending, pt reported tightness of the R suboccipital region. Which decreased with additional TPDN and exercise. Pt my benefit from TPDN to this area. Pt tolerated PT today without adverse effects and improved Headache relief. Pt needs to increased strength of lower subscapular stabilizers to counteract tonic levator muscle response.  Pt will continue to benefit from skilled PT to address impairments for improved neck/R UE function with less pain.  OBJECTIVE IMPAIRMENTS: decreased activity tolerance, decreased knowledge of condition, decreased mobility, decreased ROM, decreased strength, increased fascial restrictions, impaired flexibility, impaired UE functional use, improper body mechanics, postural dysfunction, and pain.    ACTIVITY LIMITATIONS: carrying, lifting, and reach over head   PARTICIPATION LIMITATIONS: meal prep, cleaning, laundry, driving, shopping, occupation, and yard work   PERSONAL FACTORS: Age, Profession, and Time since onset of  injury/illness/exacerbation are also affecting patient's functional outcome.      GOALS: Goals reviewed with patient? Yes   SHORT TERM GOALS: Target date: 08/21/2022   Patient will be independent and compliant with initial HEP.    Baseline: issued at eval  Goal status: met   2.  Patient will demonstrate at least 70 degrees of Lt cervical rotation AROM to improve ability to complete head turns while driving.  Baseline: see above  08/24/2022: 64 deg 08/31/22: see flow sheets Goal status: partially met   3.  Patient will demonstrate at least 50 degrees of cervical flexion AROM to improve tolerance to reading and work tasks.  Baseline: see above  Goal status: met   LONG TERM GOALS: Target date: 09/16/22   Patient will demonstrate at least 160 degrees of Rt shoulder flexion AROM to improve ability to reach overhead at work.  Baseline: see above  Goal status: INITIAL   2.  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  Baseline: unable  Goal status: INITIAL   3.  Patient will score at least 66% on FOTO to signify clinically meaningful improvement in functional abilities.  Baseline: see above  Goal status: INITIAL   4.  Patient will report pain as </=2/10 to reduce her current functional limitations.  Baseline: see above  Goal status: INITIAL     PLAN: PT FREQUENCY: 1-2x/week   PT DURATION: 6 weeks   PLANNED INTERVENTIONS: Therapeutic exercises, Therapeutic activity, Neuromuscular re-education, Patient/Family education, Self Care, Joint mobilization, Dry Needling, Spinal manipulation, Spinal mobilization, Cryotherapy, Moist heat, Traction, Manual therapy, and Re-evaluation   PLAN FOR NEXT SESSION: review and progress HEP prn; TPDN response, manual/TPDN to cervical region, cervical/shoulder mobility, strengthening as tolerated. Repeated movement response. Consider TPDN to  the R suboccipitals  Garen Lah, PT, [facility] Certified Exercise Expert for the Aging Adult  09/05/22 3:49 PM Phone: [phone] Fax: [phone]

## 2022-09-06 NOTE — Progress Notes (Unsigned)
Cardiology Office Note:    Date:  09/07/2022   ID:  Brenda Mcmahon, DOB 17-Feb-1991, MRN [id number]  PCP:  Blane Ohara, MD  Cardiologist:  Norman Herrlich, MD    Referring MD: Blane Ohara, MD    ASSESSMENT:    1. SVT (supraventricular tachycardia)    PLAN:    In order of problems listed above:  After discussion she will resume her beta-blocker to improve the quality of her life she has a smart watch and will capture breakthrough episodes.  I told her if she wants to defer the second dose of her beta-blocker she can   Next appointment: 6 months   Medication Adjustments/Labs and Tests Ordered: Current medicines are reviewed at length with the patient today.  Concerns regarding medicines are outlined above.  No orders of the defined types were placed in this encounter.  No orders of the defined types were placed in this encounter.   No chief complaint on file.   History of Present Illness:    Brenda Mcmahon is a 32 y.o. female with a hx of SVT today last seen 11/22/2021. Compliance with diet, lifestyle and medications: Notes she had stopped her beta-blocker and is much worse frequent episodes of rapid heart rhythm while at work she does not wear her watch during work and she would like to go back on a beta-blocker that improve the quality of her life Past Medical History:  Diagnosis Date   Palpitation     Past Surgical History:  Procedure Laterality Date   FOOT FRACTURE SURGERY Right 2015   also ankle fracture repaired.    WRIST FRACTURE SURGERY Left    32 yo.  fell through glass door.     Current Medications: Current Meds  Medication Sig   fluticasone (FLONASE) 50 MCG/ACT nasal spray Place 2 sprays into both nostrils daily. (Patient taking differently: Place 2 sprays into both nostrils as needed for allergies or rhinitis.)   LORazepam (ATIVAN) 0.5 MG tablet Take 1 tablet (0.5 mg total) by mouth 2 (two) times daily as needed for anxiety.     Allergies:    Hydrocodone, Kale, Oxycodone, Lactose intolerance (gi), and Hydrocodone-acetaminophen   Social History   Socioeconomic History   Marital status: Single    Spouse name: Not on file   Number of children: Not on file   Years of education: Not on file   Highest education level: Not on file  Occupational History   Occupation: Pharmacologist    Employer: [employer]  Tobacco Use   Smoking status: Never   Smokeless tobacco: Never  Substance and Sexual Activity   Alcohol use: No   Drug use: Never   Sexual activity: Not on file  Other Topics Concern   Not on file  Social History Narrative   Not on file   Social Determinants of Health   Financial Resource Strain: Low Risk  (09/13/2021)   Overall Financial Resource Strain (CARDIA)    Difficulty of Paying Living Expenses: Not very hard  Food Insecurity: No Food Insecurity (09/13/2021)   Hunger Vital Sign    Worried About Running Out of Food in the Last Year: Never true    Ran Out of Food in the Last Year: Never true  Transportation Needs: No Transportation Needs (09/13/2021)   PRAPARE - Administrator, Civil Service (Medical): No    Lack of Transportation (Non-Medical): No  Physical Activity: Insufficiently Active (09/13/2021)   Exercise Vital Sign  Days of Exercise per Week: 3 days    Minutes of Exercise per Session: 30 min  Stress: Not on file  Social Connections: Not on file     Family History: The patient's family history includes Asthma in her brother; Hypotension in her mother; Seizures in her mother. ROS:   Please see the history of present illness.    All other systems reviewed and are negative.  EKGs/Labs/Other Studies Reviewed:    The following studies were reviewed today:  Cardiac Studies & Procedures       ECHOCARDIOGRAM  ECHOCARDIOGRAM COMPLETE 07/29/2020  Narrative ECHOCARDIOGRAM REPORT    Patient Name:   Brenda Mcmahon [facility] Date of Exam: 07/29/2020 Medical Rec #:  [id number]            Height:       62.0 in Accession #:    [id number]          Weight:       124.1 lb Date of Birth:  Aug 28, 1990            BSA:          1.561 m Patient Age:    29 years            BP:           120/58 mmHg Patient Gender: F                   HR:           85 bpm. Exam Location:  [address]  Procedure: 2D Echo, 3D Echo, Cardiac Doppler, Color Doppler and Strain Analysis  Indications:    R00.2 Palpitations  History:        Patient has no prior history of Echocardiogram examinations.  Sonographer:    Daphine Deutscher RDCS Referring Phys: [id number] Kaidon Kinker J Griffey Nicasio  IMPRESSIONS   1. Left ventricular ejection fraction, by estimation, is 70 to 75%. Left ventricular ejection fraction by 3D volume is 69 %. The left ventricle has hyperdynamic function. The left ventricle has no regional wall motion abnormalities. Left ventricular diastolic parameters were normal. The average left ventricular global longitudinal strain is -28.5 %. The global longitudinal strain is normal. 2. Right ventricular systolic function is normal. The right ventricular size is normal. 3. The mitral valve is normal in structure. No evidence of mitral valve regurgitation. No evidence of mitral stenosis. 4. The aortic valve is tricuspid. Aortic valve regurgitation is not visualized. No aortic stenosis is present. 5. The inferior vena cava is normal in size with greater than 50% respiratory variability, suggesting right atrial pressure of 3 mmHg.  Comparison(s): No prior Echocardiogram.  FINDINGS Left Ventricle: Left ventricular ejection fraction, by estimation, is 70 to 75%. Left ventricular ejection fraction by 3D volume is 69 %. The left ventricle has hyperdynamic function. The left ventricle has no regional wall motion abnormalities. The average left ventricular global longitudinal strain is -28.5 %. The global longitudinal strain is normal. The left ventricular internal cavity size was normal in size. There is no left  ventricular hypertrophy. Left ventricular diastolic parameters were normal.  Right Ventricle: The right ventricular size is normal. No increase in right ventricular wall thickness. Right ventricular systolic function is normal.  Left Atrium: Left atrial size was normal in size.  Right Atrium: Right atrial size was normal in size.  Pericardium: There is no evidence of pericardial effusion.  Mitral Valve: The mitral valve is normal in structure. No evidence of mitral valve regurgitation. No  evidence of mitral valve stenosis.  Tricuspid Valve: The tricuspid valve is normal in structure. Tricuspid valve regurgitation is trivial.  Aortic Valve: The aortic valve is tricuspid. Aortic valve regurgitation is not visualized. No aortic stenosis is present.  Pulmonic Valve: The pulmonic valve was normal in structure. Pulmonic valve regurgitation is trivial.  Aorta: The aortic root and ascending aorta are structurally normal, with no evidence of dilitation.  Venous: The inferior vena cava is normal in size with greater than 50% respiratory variability, suggesting right atrial pressure of 3 mmHg.  IAS/Shunts: No atrial level shunt detected by color flow Doppler.   LEFT VENTRICLE PLAX 2D LVIDd:         4.50 cm         Diastology LVIDs:         2.50 cm         LV e' medial:    13.50 cm/s LV PW:         0.80 cm         LV E/e' medial:  6.9 LV IVS:        0.50 cm         LV e' lateral:   15.10 cm/s LVOT diam:     1.80 cm         LV E/e' lateral: 6.1 LV SV:         56 LV SV Index:   36              2D LVOT Area:     2.54 cm        Longitudinal Strain 2D Strain GLS  -28.1 % (A2C): 2D Strain GLS  -29.1 % (A3C): 2D Strain GLS  -28.3 % (A4C): 2D Strain GLS  -28.5 % Avg:  3D Volume EF LV 3D EF:    Left ventricular ejection fraction by 3D volume is 69 %.  3D Volume EF: 3D EF:        69 % LV EDV:       104 ml LV ESV:       32 ml LV SV:        71 ml  RIGHT VENTRICLE              IVC RV Basal diam:  3.10 cm     IVC diam: 1.00 cm RV S prime:     17.80 cm/s TAPSE (M-mode): 2.8 cm  LEFT ATRIUM             Index       RIGHT ATRIUM          Index LA diam:        3.90 cm 2.50 cm/m  RA Area:     8.63 cm LA Vol (A2C):   45.1 ml 28.90 ml/m RA Volume:   17.60 ml 11.28 ml/m LA Vol (A4C):   39.6 ml 25.37 ml/m LA Biplane Vol: 42.8 ml 27.42 ml/m AORTIC VALVE LVOT Vmax:   117.50 cm/s LVOT Vmean:  88.000 cm/s LVOT VTI:    0.219 m  AORTA Ao Root diam: 2.50 cm Ao Asc diam:  2.30 cm  MITRAL VALVE               TRICUSPID VALVE MV Area (PHT): 2.95 cm    TR Peak grad:   21.3 mmHg MV Decel Time: 257 msec    TR Vmax:        231.00 cm/s MV E velocity: 92.60 cm/s MV A velocity: 57.20 cm/s  SHUNTS MV E/A ratio:  1.62        Systemic VTI:  0.22 m Systemic Diam: 1.80 cm  Laurance Flatten MD Electronically signed by Laurance Flatten MD Signature Date/Time: 07/29/2020/5:42:07 PM    Final    MONITORS  LONG TERM MONITOR-LIVE TELEMETRY (3-14 DAYS) 08/21/2020  Narrative Patch Wear Time:  14 days and 0 hours (2022-04-11T16:24:43-0400 to 2022-04-25T16:24:43-0400)  Patient had a min HR of 45 bpm, max HR of 175 bpm, and avg HR of 82 bpm. Predominant underlying rhythm was Sinus Rhythm. Isolated SVEs were rare (<1.0%), SVE Triplets were rare (<1.0%), and no SVE Couplets were present. Isolated VEs were rare (<1.0%), and no VE Couplets or VE Triplets were present.  There were 13 triggered and 6 diary events all associated with sinus rhythm and sinus tachycardia 1 with a single atrial premature contraction.  Ventricular ectopy was rare.  Supraventricular ectopy was rare without atrial fibrillation or flutter.  Were no pauses of 3 seconds or greater and no episodes of second or third-degree AV node block or sinus node exit block.             Recent Labs: 09/13/2021: ALT 14; BUN 7; Creatinine, Ser 0.88; Hemoglobin 13.8; Platelets 230; Potassium 4.5; Sodium 137; TSH 1.620   Recent Lipid Panel    Component Value Date/Time   CHOL 147 09/13/2021 1159   TRIG 58 09/13/2021 1159   HDL 65 09/13/2021 1159   CHOLHDL 2.3 09/13/2021 1159   LDLCALC 70 09/13/2021 1159    Physical Exam:    VS:  BP 104/60 (BP Location: Left Arm, Patient Position: Sitting, Cuff Size: Normal)   Pulse 74   Ht 5\' 2"  (1.575 m)   Wt 132 lb (59.9 kg)   LMP  (LMP Unknown)   SpO2 99%   BMI 24.14 kg/m     Wt Readings from Last 3 Encounters:  09/07/22 132 lb (59.9 kg)  06/30/22 125 lb (56.7 kg)  11/22/21 130 lb 1.3 oz (59 kg)     GEN:  Well nourished, well developed in no acute distress HEENT: Normal NECK: No JVD; No carotid bruits LYMPHATICS: No lymphadenopathy CARDIAC: RRR, no murmurs, rubs, gallops RESPIRATORY:  Clear to auscultation without rales, wheezing or rhonchi  ABDOMEN: Soft, non-tender, non-distended MUSCULOSKELETAL:  No edema; No deformity  SKIN: Warm and dry NEUROLOGIC:  Alert and oriented x 3 PSYCHIATRIC:  Normal affect    Signed, Norman Herrlich, MD  09/07/2022 4:02 PM    [facility]

## 2022-09-07 ENCOUNTER — Encounter: Payer: Self-pay | Admitting: Cardiology

## 2022-09-07 ENCOUNTER — Other Ambulatory Visit (HOSPITAL_COMMUNITY): Payer: Self-pay

## 2022-09-07 ENCOUNTER — Ambulatory Visit: Payer: 59 | Attending: Cardiology | Admitting: Cardiology

## 2022-09-07 VITALS — BP 104/60 | HR 74 | Ht 62.0 in | Wt 132.0 lb

## 2022-09-07 DIAGNOSIS — I471 Supraventricular tachycardia, unspecified: Secondary | ICD-10-CM | POA: Diagnosis not present

## 2022-09-07 MED ORDER — ACEBUTOLOL HCL 200 MG PO CAPS
200.0000 mg | ORAL_CAPSULE | Freq: Two times a day (BID) | ORAL | 3 refills | Status: DC
  Filled 2022-09-07: qty 180, 90d supply, fill #0

## 2022-09-07 NOTE — Addendum Note (Signed)
Addended by: Roxanne Mins I on: 09/07/2022 04:12 PM   Modules accepted: Orders

## 2022-09-07 NOTE — Patient Instructions (Addendum)
Medication Instructions:  Your physician has recommended you make the following change in your medication:   START: Acebutolol 200 mg twice daily  *If you need a refill on your cardiac medications before your next appointment, please call your pharmacy*   Lab Work: None If you have labs (blood work) drawn today and your tests are completely normal, you will receive your results only by: MyChart Message (if you have MyChart) OR A paper copy in the mail If you have any lab test that is abnormal or we need to change your treatment, we will call you to review the results.   Testing/Procedures: None   Follow-Up: At [facility], you and your health needs are our priority.  As part of our continuing mission to provide you with exceptional heart care, we have created designated Provider Care Teams.  These Care Teams include your primary Cardiologist (physician) and Advanced Practice Providers (APPs -  Physician Assistants and Nurse Practitioners) who all work together to provide you with the care you need, when you need it.  We recommend signing up for the patient portal called "MyChart".  Sign up information is provided on this After Visit Summary.  MyChart is used to connect with patients for Virtual Visits (Telemedicine).  Patients are able to view lab/test results, encounter notes, upcoming appointments, etc.  Non-urgent messages can be sent to your provider as well.   To learn more about what you can do with MyChart, go to [email or web address].    Your next appointment:   6 month(s)  Provider:   Norman Herrlich, MD    Other Instructions None    1. Avoid all over-the-counter antihistamines except Claritin/Loratadine and Zyrtec/Cetrizine. 2. Avoid all combination including cold sinus allergies flu decongestant and sleep medications 3. You can use Robitussin DM Mucinex and Mucinex DM for cough. 4. can use Tylenol aspirin ibuprofen and naproxen but no combinations such as  sleep or sinus.

## 2022-09-08 ENCOUNTER — Other Ambulatory Visit (HOSPITAL_COMMUNITY): Payer: Self-pay

## 2022-09-08 ENCOUNTER — Other Ambulatory Visit (HOSPITAL_BASED_OUTPATIENT_CLINIC_OR_DEPARTMENT_OTHER): Payer: Self-pay

## 2022-09-08 ENCOUNTER — Other Ambulatory Visit: Payer: Self-pay

## 2022-09-12 ENCOUNTER — Ambulatory Visit: Payer: PRIVATE HEALTH INSURANCE | Attending: Family Medicine | Admitting: Physical Therapy

## 2022-09-12 ENCOUNTER — Encounter: Payer: Self-pay | Admitting: Physical Therapy

## 2022-09-12 ENCOUNTER — Other Ambulatory Visit (HOSPITAL_COMMUNITY): Payer: Self-pay

## 2022-09-12 ENCOUNTER — Other Ambulatory Visit: Payer: Self-pay

## 2022-09-12 DIAGNOSIS — M542 Cervicalgia: Secondary | ICD-10-CM | POA: Diagnosis present

## 2022-09-12 DIAGNOSIS — R293 Abnormal posture: Secondary | ICD-10-CM | POA: Diagnosis present

## 2022-09-12 DIAGNOSIS — R252 Cramp and spasm: Secondary | ICD-10-CM | POA: Insufficient documentation

## 2022-09-12 DIAGNOSIS — M6281 Muscle weakness (generalized): Secondary | ICD-10-CM | POA: Diagnosis present

## 2022-09-12 NOTE — Therapy (Signed)
OUTPATIENT PHYSICAL THERAPY TREATMENT NOTE   Patient Name: Brenda Mcmahon MRN: [id number] DOB:10-23-1990, 32 y.o., female Today's Date: 09/12/2022  PCP: Blane Ohara, MD  REFERRING PROVIDER:   Lanell Persons, MD    END OF SESSION:   PT End of Session - 09/12/22 1536     Visit Number 9    Number of Visits 13    Date for PT Re-Evaluation 09/16/22    Authorization Type Worker's comp 5/16- 6 new visits approved    Authorization - Visit Number 9    Authorization - Number of Visits 12    PT Start Time 1530    PT Stop Time 1615    PT Time Calculation (min) 45 min    Activity Tolerance Patient tolerated treatment well    Behavior During Therapy WFL for tasks assessed/performed                   Past Medical History:  Diagnosis Date   Palpitation    Past Surgical History:  Procedure Laterality Date   FOOT FRACTURE SURGERY Right 2015   also ankle fracture repaired.    WRIST FRACTURE SURGERY Left    32 yo.  fell through glass door.    Patient Active Problem List   Diagnosis Date Noted   Encounter for general adult medical examination without abnormal findings 09/18/2021   Need for DTaP vaccine 09/18/2021   Moderate recurrent major depression (HCC) 08/07/2021   GAD (generalized anxiety disorder) 08/07/2021   Encounter for other contraceptive management 08/07/2021   Palpitation 11/10/2020   Achilles bursitis of right lower extremity 03/14/2017   Depression, major, single episode, moderate (HCC) 03/14/2017    REFERRING DIAG: M57.84ON (ICD-10-CM) - Contusion of other specified part of neck, subsequent encounter   THERAPY DIAG:  Cervicalgia  Cramp and spasm  Abnormal posture  Muscle weakness (generalized)  Rationale for Evaluation and Treatment Rehabilitation  PERTINENT HISTORY: Work-related injury on 06/30/22   PRECAUTIONS: none    SUBJECTIVE:                                                                                                                                                                                      SUBJECTIVE STATEMENT:  Patient reports she has had a consistent headache since last week, and so she is being sent to orthopedics with an appointment on the 09/20/22 with [facility]. She reports migraine is currently "12/10" and she has been seeing black spots and has been feeling nauseous.   PAIN:  Are you having pain? Yes:  NPRS scale: 12/10 Pain location: Migraine, right neck Pain description: sharp, dull  Aggravating factors: arm movement  Relieving  factors: heat   OBJECTIVE: (objective measures completed at initial evaluation unless otherwise dated) DIAGNOSTIC FINDINGS:  IMPRESSION: Negative cervical spine radiographs.   PATIENT SURVEYS:  FOTO 57% function to 66% predicted   POSTURE:  Rounded shoulders and forward head   PALPATION: Tautness and palpable tenderness Rt upper trap, cervical paraspinals, middle trap, levator scapulae, rhomboids             CERVICAL ROM:    Active ROM A/PROM (deg) eval 08/14/22 08/24/2022 08/31/22  Flexion 42 pn 62 "pulling"     Extension 50 "sore" 50 "pulling"    Right lateral flexion 35 pn   50  Left lateral flexion 30 pn   50  Right rotation 80 "pull"   80  Left rotation 60 pn  65 65   (Blank rows = not tested)   UPPER EXTREMITY ROM:   Active ROM Right eval Left eval  Shoulder flexion 145 "sore" Full   Shoulder extension      Shoulder abduction      Shoulder adduction      Shoulder extension      Shoulder internal rotation      Shoulder external rotation      Elbow flexion      Elbow extension      Wrist flexion      Wrist extension      Wrist ulnar deviation      Wrist radial deviation      Wrist pronation      Wrist supination       (Blank rows = not tested)   UPPER EXTREMITY MMT:   MMT Right eval Left eval Right 08/24/2022  Shoulder flexion 4+ pn 5   Shoulder extension       Shoulder abduction 5 5   Shoulder adduction       Shoulder  extension       Shoulder internal rotation       Shoulder external rotation       Middle trapezius 4+ pn 4+  4  Lower trapezius 4- pn 4-  4-  Elbow flexion       Elbow extension       Wrist flexion       Wrist extension       Wrist ulnar deviation       Wrist radial deviation       Wrist pronation       Wrist supination       Grip strength        (Blank rows = not tested)   CERVICAL SPECIAL TESTS:  Cervical compression (-) Cervical distraction  (-)    FUNCTIONAL TESTS:  N/A   TODAY'S TREATMENT [facility] Adult PT Treatment:                                                DATE: 09/12/22 Manual Therapy: Skilled palpation and monitoring of muscle tension while performing TPDN STM right upper trap, levator scapulae, cervical paraspinals, rhomboids Gentle suboccipital release and manual traction x 4 bouts Passive cervical retraction, upper trap, and levator stretch Gentle cervical mobs C2-C5 Trigger Point Dry Needling Treatment: Pre-treatment instruction: Patient instructed on dry needling rationale, procedures, and possible side effects including pain during treatment (achy,cramping feeling), bruising, drop of blood, lightheadedness, nausea, sweating. Patient Consent Given: Yes Education handout provided: Previously provided Muscles treated: Bilateral  upper trap, right rhomboid region, suboccipital, splenius cervicis,cervical multifidi  Needle size and number: .30x50mm x 8 Electrical stimulation performed: No Parameters: N/A Treatment response/outcome: Twitch response elicited and Palpable decrease in muscle tension Post-treatment instructions: Patient instructed to expect possible mild to moderate muscle soreness later today and/or tomorrow. Patient instructed in methods to reduce muscle soreness and to continue prescribed HEP. If patient was dry needled over the lung field, patient was instructed on signs and symptoms of pneumothorax and, however unlikely, to see immediate medical  attention should they occur. Patient was also educated on signs and symptoms of infection and to seek medical attention should they occur. Patient verbalized understanding of these instructions and education. Modalities: MHP to neck/shoulders in prone x 8 min   [facility] Adult PT Treatment:                                                DATE: 09-05-22 Therapeutic Exercise: Supine chin tucks x5 5" Neck liftoffs x5 5" Sidelying thoracic book opening. Pillow between knees 1 x 10 on R and L Manual Therapy: Skilled palpation and monitoring of muscle tension while performing TPDN STW right upper trap, levator scapulae, cervical paraspinals, rhomboids Prone UPA on R C-3,4, 5 Suboccital realease bil Resisted L sidelying ER by PT Seated cervical distraction with cavitation. Trigger Point Dry Needling Treatment: Pre-treatment instruction: Patient instructed on dry needling rationale, procedures, and possible side effects including pain during treatment (achy,cramping feeling), bruising, drop of blood, lightheadedness, nausea, sweating. Patient Consent Given: Yes Education handout provided: Previously provided Muscles treated: Right upper trap and rhomboid/mid trap region, R sub scapularis R scalenes , C-3,4, 5 bil suboccipital Needle size and number: .30x3mm x 6 Electrical stimulation performed: No Parameters: N/A Treatment response/outcome: Twitch response elicited and Palpable decrease in muscle tension Post-treatment instructions: Patient instructed to expect possible mild to moderate muscle soreness later today and/or tomorrow. Patient instructed in methods to reduce muscle soreness and to continue prescribed HEP. If patient was dry needled over the lung field, patient was instructed on signs and symptoms of pneumothorax and, however unlikely, to see immediate medical attention should they occur. Patient was also educated on signs and symptoms of infection and to seek medical attention should they  occur. Patient verbalized understanding of these instructions and education.   [facility] Adult PT Treatment:                                                DATE: 08/31/22 Therapeutic Exercise: Supine chin tucks x5 5" Neck liftoffs x5 5" Cervical rotation c gentle pt overpressure Upper trap strech Chin tuck c sidebending Manual Therapy: STM/DTM to the R cervical and thoracic paraspinals and interscapular muscles c MPTR Cervical traction Suboccital realease  [facility] Adult PT Treatment:                                                DATE: 08/24/22 Therapeutic Exercise: UBE L1 x 4 min (fwd/bwd) while taking subjective Sidelying thoracic rotation x 10 each Quadruped thread the needle with FR x 5 each Prone I and W 2  x 5 each Seated cervical retraction with extension x 10  Manual Therapy: Skilled palpation and monitoring of muscle tension while performing TPDN DTM right upper trap, levator scapulae, cervical paraspinals, rhomboids Trigger Point Dry Needling Treatment: Pre-treatment instruction: Patient instructed on dry needling rationale, procedures, and possible side effects including pain during treatment (achy,cramping feeling), bruising, drop of blood, lightheadedness, nausea, sweating. Patient Consent Given: Yes Education handout provided: Previously provided Muscles treated: Right upper trap and rhomboid/mid trap region Needle size and number: .30x28mm x 6 Electrical stimulation performed: No Parameters: N/A Treatment response/outcome: Twitch response elicited and Palpable decrease in muscle tension Post-treatment instructions: Patient instructed to expect possible mild to moderate muscle soreness later today and/or tomorrow. Patient instructed in methods to reduce muscle soreness and to continue prescribed HEP. If patient was dry needled over the lung field, patient was instructed on signs and symptoms of pneumothorax and, however unlikely, to see immediate medical attention should they  occur. Patient was also educated on signs and symptoms of infection and to seek medical attention should they occur. Patient verbalized understanding of these instructions and education.  [facility] Adult PT Treatment:                                                DATE: 08/21/22 Therapeutic Exercise: Cervical retraction with extension x 10  Seated thoracic extension x 10  Thread the needle x 10 each  Serratus wall slides 2 x 10  Resisted shoulder extension green band 2 x 10  Resisted rows 2 x 10 blue band  Updated HEP   [facility] Adult PT Treatment:                                                DATE: 08/17/22 Therapeutic Exercise: Sidelying thoracic rotation x 5 each Supine thoracic extension over FR at various level  Child's pose thoracic rotation x 5 each Cat cow x 5 Seated cervical retraction with extension x 10  Row with blue x 10 Manual Therapy: Skilled palpation and monitoring of muscle tension while performing TPDN DTM right upper trap, levator scapulae, cervical paraspinals, rhomboids Trigger Point Dry Needling Treatment: Pre-treatment instruction: Patient instructed on dry needling rationale, procedures, and possible side effects including pain during treatment (achy,cramping feeling), bruising, drop of blood, lightheadedness, nausea, sweating. Patient Consent Given: Yes Education handout provided: Previously provided Muscles treated: Right upper trap and rhomboid/mid trap region Needle size and number: .25x53mm x 2, .30x22mm x 4 Electrical stimulation performed: No Parameters: N/A Treatment response/outcome: Twitch response elicited and Palpable decrease in muscle tension Post-treatment instructions: Patient instructed to expect possible mild to moderate muscle soreness later today and/or tomorrow. Patient instructed in methods to reduce muscle soreness and to continue prescribed HEP. If patient was dry needled over the lung field, patient was instructed on signs and symptoms of  pneumothorax and, however unlikely, to see immediate medical attention should they occur. Patient was also educated on signs and symptoms of infection and to seek medical attention should they occur. Patient verbalized understanding of these instructions and education.    PATIENT EDUCATION:  Education details: HEP Person educated: Patient Education method: Programmer, multimedia, demo, cues Education comprehension: verbalized understanding, returned demo, cues    HOME EXERCISE PROGRAM:  Access Code: [email or web address]    ASSESSMENT: CLINICAL IMPRESSION: Patient with fair tolerance for therapy due to report of persistent migraine, no adverse effects reported with therapy. Therapy focused primarily on TPDN and manual to try and reduce cervical tension and migraine symptoms. She was not able to tolerate any therex this visit due to severity of migraine. She is scheduled with [facility] on 09/20/22 for further assessment. No changes made to her HEP this visit. Patient would benefit from continued skilled PT to progress her mobility and strength in order to reduce pain and maximize functional ability.    OBJECTIVE IMPAIRMENTS: decreased activity tolerance, decreased knowledge of condition, decreased mobility, decreased ROM, decreased strength, increased fascial restrictions, impaired flexibility, impaired UE functional use, improper body mechanics, postural dysfunction, and pain.    ACTIVITY LIMITATIONS: carrying, lifting, and reach over head   PARTICIPATION LIMITATIONS: meal prep, cleaning, laundry, driving, shopping, occupation, and yard work   PERSONAL FACTORS: Age, Profession, and Time since onset of injury/illness/exacerbation are also affecting patient's functional outcome.      GOALS: Goals reviewed with patient? Yes   SHORT TERM GOALS: Target date: 08/21/2022   Patient will be independent and compliant with initial HEP.    Baseline: issued at eval  Goal status: met   2.  Patient will demonstrate at  least 70 degrees of Lt cervical rotation AROM to improve ability to complete head turns while driving.  Baseline: see above  08/24/2022: 64 deg 08/31/22: see flow sheets Goal status: partially met   3.  Patient will demonstrate at least 50 degrees of cervical flexion AROM to improve tolerance to reading and work tasks.  Baseline: see above  Goal status: met   LONG TERM GOALS: Target date: 09/16/22   Patient will demonstrate at least 160 degrees of Rt shoulder flexion AROM to improve ability to reach overhead at work.  Baseline: see above  Goal status: INITIAL   2.  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  Baseline: unable  Goal status: INITIAL   3.  Patient will score at least 66% on FOTO to signify clinically meaningful improvement in functional abilities.  Baseline: see above  Goal status: INITIAL   4.  Patient will report pain as </=2/10 to reduce her current functional limitations.  Baseline: see above  Goal status: INITIAL     PLAN: PT FREQUENCY: 1-2x/week   PT DURATION: 6 weeks   PLANNED INTERVENTIONS: Therapeutic exercises, Therapeutic activity, Neuromuscular re-education, Patient/Family education, Self Care, Joint mobilization, Dry Needling, Spinal manipulation, Spinal mobilization, Cryotherapy, Moist heat, Traction, Manual therapy, and Re-evaluation   PLAN FOR NEXT SESSION: Re-cert prior to 09/16/22, review and progress HEP prn; TPDN response, manual/TPDN to cervical region, cervical/shoulder mobility, strengthening as tolerated. Repeated movement response. Consider TPDN to the R suboccipitals    Rosana Hoes, PT, DPT, LAT, ATC 09/12/22  4:32 PM Phone: [phone] Fax: [phone]

## 2022-09-13 ENCOUNTER — Other Ambulatory Visit (HOSPITAL_COMMUNITY): Payer: Self-pay

## 2022-09-14 ENCOUNTER — Ambulatory Visit: Payer: PRIVATE HEALTH INSURANCE | Attending: Family Medicine

## 2022-09-14 DIAGNOSIS — R293 Abnormal posture: Secondary | ICD-10-CM | POA: Insufficient documentation

## 2022-09-14 DIAGNOSIS — R252 Cramp and spasm: Secondary | ICD-10-CM | POA: Insufficient documentation

## 2022-09-14 DIAGNOSIS — M6281 Muscle weakness (generalized): Secondary | ICD-10-CM | POA: Diagnosis present

## 2022-09-14 DIAGNOSIS — M542 Cervicalgia: Secondary | ICD-10-CM | POA: Insufficient documentation

## 2022-09-14 NOTE — Therapy (Addendum)
OUTPATIENT PHYSICAL THERAPY TREATMENT NOTE/ProgressNote   Patient Name: Brenda Mcmahon MRN: [id number] DOB:21-Feb-1991, 32 y.o., female Today's Date: 09/14/2022  PCP: Blane Ohara, MD   REFERRING PROVIDER: Lanell Persons, MD  Reporting Period 07/31/22 to 09/14/22  See note below for Objective Data and Assessment of Progress/Goals.      END OF SESSION:   PT End of Session - 09/14/22 1725     Visit Number 10    Number of Visits 13    Date for PT Re-Evaluation 09/16/22    Authorization Type Worker's comp 5/16- 6 new visits approved    Authorization - Visit Number 10    Authorization - Number of Visits 12    PT Start Time 1632    PT Stop Time 1720    PT Time Calculation (min) 48 min    Activity Tolerance Patient tolerated treatment well    Behavior During Therapy WFL for tasks assessed/performed                    Past Medical History:  Diagnosis Date   Palpitation    Past Surgical History:  Procedure Laterality Date   FOOT FRACTURE SURGERY Right 2015   also ankle fracture repaired.    WRIST FRACTURE SURGERY Left    32 yo.  fell through glass door.    Patient Active Problem List   Diagnosis Date Noted   Encounter for general adult medical examination without abnormal findings 09/18/2021   Need for DTaP vaccine 09/18/2021   Moderate recurrent major depression (HCC) 08/07/2021   GAD (generalized anxiety disorder) 08/07/2021   Encounter for other contraceptive management 08/07/2021   Palpitation 11/10/2020   Achilles bursitis of right lower extremity 03/14/2017   Depression, major, single episode, moderate (HCC) 03/14/2017    REFERRING DIAG: W09.81XB (ICD-10-CM) - Contusion of other specified part of neck, subsequent encounter   THERAPY DIAG:  Cervicalgia  Cramp and spasm  Abnormal posture  Muscle weakness (generalized)  Rationale for Evaluation and Treatment Rehabilitation  PERTINENT HISTORY: Work-related injury on 06/30/22   PRECAUTIONS:  none    SUBJECTIVE:                                                                                                                                                                                     SUBJECTIVE STATEMENT:  Patient reports her HA has continued. Pt notes on Tues she slept a lot and at one point she woke up, and threw up. Pt reports she has not been able to work for 2 days. Pt has an  orthopedics appointment for her HA has on the 09/20/22 with [facility].   PAIN:  Are you having pain? Yes:  NPRS scale: 12/10 Pain location: Migraine, right neck Pain description: sharp, dull  Aggravating factors: arm movement  Relieving factors: heat   OBJECTIVE: (objective measures completed at initial evaluation unless otherwise dated) DIAGNOSTIC FINDINGS:  IMPRESSION: Negative cervical spine radiographs.   PATIENT SURVEYS:  FOTO 57% function to 66% predicted   POSTURE:  Rounded shoulders and forward head   PALPATION: Tautness and palpable tenderness Rt upper trap, cervical paraspinals, middle trap, levator scapulae, rhomboids             CERVICAL ROM:    Active ROM A/PROM (deg) eval 08/14/22 08/24/2022 08/31/22  Flexion 42 pn 62 "pulling"     Extension 50 "sore" 50 "pulling"    Right lateral flexion 35 pn   50  Left lateral flexion 30 pn   50  Right rotation 80 "pull"   80  Left rotation 60 pn  65 65   (Blank rows = not tested)   UPPER EXTREMITY ROM:   Active ROM Right eval Left eval  Shoulder flexion 145 "sore" Full   Shoulder extension      Shoulder abduction      Shoulder adduction      Shoulder extension      Shoulder internal rotation      Shoulder external rotation      Elbow flexion      Elbow extension      Wrist flexion      Wrist extension      Wrist ulnar deviation      Wrist radial deviation      Wrist pronation      Wrist supination       (Blank rows = not tested)   UPPER EXTREMITY MMT:   MMT Right eval Left eval Right 08/24/2022  Shoulder  flexion 4+ pn 5   Shoulder extension       Shoulder abduction 5 5   Shoulder adduction       Shoulder extension       Shoulder internal rotation       Shoulder external rotation       Middle trapezius 4+ pn 4+  4  Lower trapezius 4- pn 4-  4-  Elbow flexion       Elbow extension       Wrist flexion       Wrist extension       Wrist ulnar deviation       Wrist radial deviation       Wrist pronation       Wrist supination       Grip strength        (Blank rows = not tested)   CERVICAL SPECIAL TESTS:  Cervical compression (-) Cervical distraction  (-)    FUNCTIONAL TESTS:  N/A   TODAY'S TREATMENT [facility] Adult PT Treatment:                                                DATE: 09/14/22 Manual Therapy: Skilled palpation and monitoring of muscle tension while performing TPDN STM Bilat upper traps, levator scapulae, cervical paraspinals, suboccipitals, and ant temporalis Gentle suboccipital release and manual traction Trigger Point Dry Needling Treatment: Pre-treatment instruction: Patient instructed on dry needling rationale, procedures, and possible side effects including pain during treatment (achy,cramping feeling), bruising, drop of blood, lightheadedness, nausea,  sweating. Patient Consent Given: Yes Education handout provided: Previously provided Muscles treated: Bilateral suboccipital, upper cervical paraspinals, ant temporalis Needle size and number: .30x68mm x 2 and .30x69mm x2 Electrical stimulation performed: No Parameters: N/A Treatment response/outcome: Twitch response elicited and Palpable decrease in muscle tension Post-treatment instructions: Patient instructed to expect possible mild to moderate muscle soreness later today and/or tomorrow. Patient instructed in methods to reduce muscle soreness and to continue prescribed HEP. If patient was dry needled over the lung field, patient was instructed on signs and symptoms of pneumothorax and, however unlikely, to see  immediate medical attention should they occur. Patient was also educated on signs and symptoms of infection and to seek medical attention should they occur. Patient verbalized understanding of these instructions and education. Therapeutic Exercise: Supine chin tucks x10 3" Chin tuck c cervical rotation c gentle pt overpressure x3 15" Chin tuck c sidebending x3 15"   [facility] Adult PT Treatment:                                                DATE: 09/12/22 Manual Therapy: Skilled palpation and monitoring of muscle tension while performing TPDN STM right upper trap, levator scapulae, cervical paraspinals, rhomboids Gentle suboccipital release and manual traction x 4 bouts Passive cervical retraction, upper trap, and levator stretch Gentle cervical mobs C2-C5 Trigger Point Dry Needling Treatment: Pre-treatment instruction: Patient instructed on dry needling rationale, procedures, and possible side effects including pain during treatment (achy,cramping feeling), bruising, drop of blood, lightheadedness, nausea, sweating. Patient Consent Given: Yes Education handout provided: Previously provided Muscles treated: Bilateral upper trap, right rhomboid region, suboccipital, splenius cervicis,cervical multifidi  Needle size and number: .30x27mm x 8 Electrical stimulation performed: No Parameters: N/A Treatment response/outcome: Twitch response elicited and Palpable decrease in muscle tension Post-treatment instructions: Patient instructed to expect possible mild to moderate muscle soreness later today and/or tomorrow. Patient instructed in methods to reduce muscle soreness and to continue prescribed HEP. If patient was dry needled over the lung field, patient was instructed on signs and symptoms of pneumothorax and, however unlikely, to see immediate medical attention should they occur. Patient was also educated on signs and symptoms of infection and to seek medical attention should they occur. Patient  verbalized understanding of these instructions and education. Modalities: MHP to neck/shoulders in prone x 8 min   [facility] Adult PT Treatment:                                                DATE: 09-05-22 Therapeutic Exercise: Supine chin tucks x5 5" Neck liftoffs x5 5" Sidelying thoracic book opening. Pillow between knees 1 x 10 on R and L Manual Therapy: Skilled palpation and monitoring of muscle tension while performing TPDN STW right upper trap, levator scapulae, cervical paraspinals, rhomboids Prone UPA on R C-3,4, 5 Suboccital realease bil Resisted L sidelying ER by PT Seated cervical distraction with cavitation. Trigger Point Dry Needling Treatment: Pre-treatment instruction: Patient instructed on dry needling rationale, procedures, and possible side effects including pain during treatment (achy,cramping feeling), bruising, drop of blood, lightheadedness, nausea, sweating. Patient Consent Given: Yes Education handout provided: Previously provided Muscles treated: Right upper trap and rhomboid/mid trap region, R sub scapularis R scalenes , C-3,4,  5 bil suboccipital Needle size and number: .30x26mm x 6 Electrical stimulation performed: No Parameters: N/A Treatment response/outcome: Twitch response elicited and Palpable decrease in muscle tension Post-treatment instructions: Patient instructed to expect possible mild to moderate muscle soreness later today and/or tomorrow. Patient instructed in methods to reduce muscle soreness and to continue prescribed HEP. If patient was dry needled over the lung field, patient was instructed on signs and symptoms of pneumothorax and, however unlikely, to see immediate medical attention should they occur. Patient was also educated on signs and symptoms of infection and to seek medical attention should they occur. Patient verbalized understanding of these instructions and education.   [facility] Adult PT Treatment:                                                 DATE: 08/31/22 Therapeutic Exercise: Supine chin tucks x5 5" Neck liftoffs x5 5" Cervical rotation c gentle pt overpressure Upper trap strech Chin tuck c sidebending Manual Therapy: STM/DTM to the R cervical and thoracic paraspinals and interscapular muscles c MPTR Cervical traction Suboccital realease  [facility] Adult PT Treatment:                                                DATE: 08/24/22 Therapeutic Exercise: UBE L1 x 4 min (fwd/bwd) while taking subjective Sidelying thoracic rotation x 10 each Quadruped thread the needle with FR x 5 each Prone I and W 2 x 5 each Seated cervical retraction with extension x 10  Manual Therapy: Skilled palpation and monitoring of muscle tension while performing TPDN DTM right upper trap, levator scapulae, cervical paraspinals, rhomboids Trigger Point Dry Needling Treatment: Pre-treatment instruction: Patient instructed on dry needling rationale, procedures, and possible side effects including pain during treatment (achy,cramping feeling), bruising, drop of blood, lightheadedness, nausea, sweating. Patient Consent Given: Yes Education handout provided: Previously provided Muscles treated: Right upper trap and rhomboid/mid trap region Needle size and number: .30x59mm x 6 Electrical stimulation performed: No Parameters: N/A Treatment response/outcome: Twitch response elicited and Palpable decrease in muscle tension Post-treatment instructions: Patient instructed to expect possible mild to moderate muscle soreness later today and/or tomorrow. Patient instructed in methods to reduce muscle soreness and to continue prescribed HEP. If patient was dry needled over the lung field, patient was instructed on signs and symptoms of pneumothorax and, however unlikely, to see immediate medical attention should they occur. Patient was also educated on signs and symptoms of infection and to seek medical attention should they occur. Patient verbalized understanding of  these instructions and education.  [facility] Adult PT Treatment:                                                DATE: 08/21/22 Therapeutic Exercise: Cervical retraction with extension x 10  Seated thoracic extension x 10  Thread the needle x 10 each  Serratus wall slides 2 x 10  Resisted shoulder extension green band 2 x 10  Resisted rows 2 x 10 blue band  Updated HEP   [facility] Adult PT Treatment:  DATE: 08/17/22 Therapeutic Exercise: Sidelying thoracic rotation x 5 each Supine thoracic extension over FR at various level  Child's pose thoracic rotation x 5 each Cat cow x 5 Seated cervical retraction with extension x 10  Row with blue x 10 Manual Therapy: Skilled palpation and monitoring of muscle tension while performing TPDN DTM right upper trap, levator scapulae, cervical paraspinals, rhomboids Trigger Point Dry Needling Treatment: Pre-treatment instruction: Patient instructed on dry needling rationale, procedures, and possible side effects including pain during treatment (achy,cramping feeling), bruising, drop of blood, lightheadedness, nausea, sweating. Patient Consent Given: Yes Education handout provided: Previously provided Muscles treated: Right upper trap and rhomboid/mid trap region Needle size and number: .25x57mm x 2, .30x34mm x 4 Electrical stimulation performed: No Parameters: N/A Treatment response/outcome: Twitch response elicited and Palpable decrease in muscle tension Post-treatment instructions: Patient instructed to expect possible mild to moderate muscle soreness later today and/or tomorrow. Patient instructed in methods to reduce muscle soreness and to continue prescribed HEP. If patient was dry needled over the lung field, patient was instructed on signs and symptoms of pneumothorax and, however unlikely, to see immediate medical attention should they occur. Patient was also educated on signs and symptoms of infection and to  seek medical attention should they occur. Patient verbalized understanding of these instructions and education.    PATIENT EDUCATION:  Education details: HEP Person educated: Patient Education method: Programmer, multimedia, demo, cues Education comprehension: verbalized understanding, returned demo, cues    HOME EXERCISE PROGRAM: Access Code: [email or web address]    ASSESSMENT: CLINICAL IMPRESSION: Pt presents with no change in her migraine level, 12/10. PT was provided for manual therapy as documented above f/b TPDN to the bilateral suboccipital, upper cervical paraspinals, ant temporalis muscles. Cervical flexibility and postural therex were then completed for muscle activation. Pt tolerated PT today without adverse effects. Will assess pt's response to today's PT session on the next PT visit. Patient would benefit from continued skilled PT to progress her mobility and strength in order to reduce pain and maximize functional ability.   OBJECTIVE IMPAIRMENTS: decreased activity tolerance, decreased knowledge of condition, decreased mobility, decreased ROM, decreased strength, increased fascial restrictions, impaired flexibility, impaired UE functional use, improper body mechanics, postural dysfunction, and pain.    ACTIVITY LIMITATIONS: carrying, lifting, and reach over head   PARTICIPATION LIMITATIONS: meal prep, cleaning, laundry, driving, shopping, occupation, and yard work   PERSONAL FACTORS: Age, Profession, and Time since onset of injury/illness/exacerbation are also affecting patient's functional outcome.      GOALS: Goals reviewed with patient? Yes   SHORT TERM GOALS: Target date: 08/21/2022   Patient will be independent and compliant with initial HEP.    Baseline: issued at eval  Goal status: met   2.  Patient will demonstrate at least 70 degrees of Lt cervical rotation AROM to improve ability to complete head turns while driving.  Baseline: see above  08/24/2022: 64 deg 08/31/22: see flow  sheets Goal status: partially met   3.  Patient will demonstrate at least 50 degrees of cervical flexion AROM to improve tolerance to reading and work tasks.  Baseline: see above  Goal status: met   LONG TERM GOALS: Target date: 09/16/22   Patient will demonstrate at least 160 degrees of Rt shoulder flexion AROM to improve ability to reach overhead at work.  Baseline: see above  Goal status: INITIAL   2.  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  Baseline: unable  Goal status: INITIAL   3.  Patient will score at least 66% on FOTO to signify clinically meaningful improvement in functional abilities.  Baseline: see above  Goal status: INITIAL   4.  Patient will report pain as </=2/10 to reduce her current functional limitations.  Baseline: see above  Goal status: INITIAL     PLAN: PT FREQUENCY: 1-2x/week   PT DURATION: 6 weeks   PLANNED INTERVENTIONS: Therapeutic exercises, Therapeutic activity, Neuromuscular re-education, Patient/Family education, Self Care, Joint mobilization, Dry Needling, Spinal manipulation, Spinal mobilization, Cryotherapy, Moist heat, Traction, Manual therapy, and Re-evaluation   PLAN FOR NEXT SESSION: Re-cert prior to 09/16/22, review and progress HEP prn; TPDN response, manual/TPDN to cervical region, cervical/shoulder mobility, strengthening as tolerated. Repeated movement response. Consider TPDN to the R suboccipitals  Joellyn Rued MS, PT 09/14/22 10:11 PM  Joellyn Rued MS, PT 09/14/22 10:11 PM

## 2022-09-15 ENCOUNTER — Encounter: Payer: No Typology Code available for payment source | Admitting: Family Medicine

## 2022-09-19 ENCOUNTER — Ambulatory Visit: Payer: PRIVATE HEALTH INSURANCE | Admitting: Physical Therapy

## 2022-09-19 ENCOUNTER — Other Ambulatory Visit (HOSPITAL_COMMUNITY): Payer: Self-pay

## 2022-09-20 ENCOUNTER — Other Ambulatory Visit (HOSPITAL_COMMUNITY): Payer: Self-pay

## 2022-09-20 MED ORDER — LIDOCAINE 5 % EX PTCH
MEDICATED_PATCH | CUTANEOUS | 0 refills | Status: DC
  Filled 2022-09-20: qty 30, 30d supply, fill #0

## 2022-09-20 MED ORDER — MELOXICAM 15 MG PO TABS
15.0000 mg | ORAL_TABLET | Freq: Every day | ORAL | 1 refills | Status: DC | PRN
  Filled 2022-09-20: qty 30, 30d supply, fill #0

## 2022-09-20 NOTE — Therapy (Signed)
OUTPATIENT PHYSICAL THERAPY TREATMENT NOTE   Patient Name: Brenda Mcmahon MRN: [id number] DOB:[birth year], 32 y.o., female Today's Date: 09/21/2022  PCP: Blane Ohara, MD  REFERRING PROVIDER: Lanell Persons, MD       END OF SESSION:   PT End of Session - 09/21/22 1604     Visit Number 11    Number of Visits 18    Date for PT Re-Evaluation 11/02/22    Authorization Type Worker's comp 5/16- 6 new visits approved    Authorization - Visit Number 11    Authorization - Number of Visits 18    PT Start Time 1600    PT Stop Time 1645    PT Time Calculation (min) 45 min    Activity Tolerance Patient tolerated treatment well    Behavior During Therapy WFL for tasks assessed/performed                     Past Medical History:  Diagnosis Date   Palpitation    Past Surgical History:  Procedure Laterality Date   FOOT FRACTURE SURGERY Right 2015   also ankle fracture repaired.    WRIST FRACTURE SURGERY Left    31 yo.  fell through glass door.    Patient Active Problem List   Diagnosis Date Noted   Encounter for general adult medical examination without abnormal findings 09/18/2021   Need for DTaP vaccine 09/18/2021   Moderate recurrent major depression (HCC) 08/07/2021   GAD (generalized anxiety disorder) 08/07/2021   Encounter for other contraceptive management 08/07/2021   Palpitation 11/10/2020   Achilles bursitis of right lower extremity 03/14/2017   Depression, major, single episode, moderate (HCC) 03/14/2017    REFERRING DIAG: S01.09NA (ICD-10-CM) - Contusion of other specified part of neck, subsequent encounter   THERAPY DIAG:  Cervicalgia  Cramp and spasm  Abnormal posture  Muscle weakness (generalized)  Rationale for Evaluation and Treatment Rehabilitation  PERTINENT HISTORY: Work-related injury on 06/30/22   PRECAUTIONS: none    SUBJECTIVE:                                                                                                                                                                                      SUBJECTIVE STATEMENT:  Patient reports she saw ortho and was given steroid injection in right upper trap region. States she does feel better today compared to last visit, headache is better but still having right neck and thoracic tightness, pain, heaviness.  PAIN:  Are you having pain? Yes:  NPRS scale: 7/10 Pain location: Migraine, right neck Pain description: sharp, dull  Aggravating factors: arm movement  Relieving factors: heat   OBJECTIVE: (objective  measures completed at initial evaluation unless otherwise dated) DIAGNOSTIC FINDINGS:  IMPRESSION: Negative cervical spine radiographs.   PATIENT SURVEYS:  FOTO 57% function to 66% predicted  09/21/2022: 47%   POSTURE:  Rounded shoulders and forward head   PALPATION: Tautness and palpable tenderness Rt upper trap, cervical paraspinals, middle trap, levator scapulae, rhomboids             CERVICAL ROM:    Active ROM A/PROM (deg) eval 08/14/22 08/24/2022 08/31/22  Flexion 42 pn 62 "pulling"     Extension 50 "sore" 50 "pulling"    Right lateral flexion 35 pn   50  Left lateral flexion 30 pn   50  Right rotation 80 "pull"   80  Left rotation 60 pn  65 65   (Blank rows = not tested)   UPPER EXTREMITY ROM:   Active ROM Right eval Left eval Right 09/21/2022  Shoulder flexion 145 "sore" Full  150  Shoulder extension       Shoulder abduction       Shoulder adduction       Shoulder extension       Shoulder internal rotation       Shoulder external rotation       Elbow flexion       Elbow extension       Wrist flexion       Wrist extension       Wrist ulnar deviation       Wrist radial deviation       Wrist pronation       Wrist supination        (Blank rows = not tested)   UPPER EXTREMITY MMT:   MMT Right eval Left eval Right 08/24/2022  Shoulder flexion 4+ pn 5   Shoulder extension       Shoulder abduction 5 5   Shoulder adduction        Shoulder extension       Shoulder internal rotation       Shoulder external rotation       Middle trapezius 4+ pn 4+  4  Lower trapezius 4- pn 4-  4-  Elbow flexion       Elbow extension       Wrist flexion       Wrist extension       Wrist ulnar deviation       Wrist radial deviation       Wrist pronation       Wrist supination       Grip strength        (Blank rows = not tested)   CERVICAL SPECIAL TESTS:  Cervical compression (-) Cervical distraction  (-)    FUNCTIONAL TESTS:  N/A   TODAY'S TREATMENT [facility] Adult PT Treatment:                                                DATE: 09/21/22 Therapeutic Exercise: Sidelying thoracic rotation x 5 each Supine thoracic extension mobs over FR Supine chin tuck with peanut ball Supine thoracic extension over peanut ball with shoulder flexion and serratus punch Supine pec stretch on FR Manual Therapy: Skilled palpation and monitoring of muscle tension while performing TPDN STM right upper trap, levator scapulae, cervical paraspinals, rhomboids Gentle suboccipital release and manual traction Passive cervical retraction, upper trap, and levator  stretch K-tape for shoulder retraction/depression Trigger Point Dry Needling Treatment: Pre-treatment instruction: Patient instructed on dry needling rationale, procedures, and possible side effects including pain during treatment (achy,cramping feeling), bruising, drop of blood, lightheadedness, nausea, sweating. Patient Consent Given: Yes Education handout provided: Previously provided Muscles treated: Right upper trap, right rhomboid region, suboccipital, splenius cervicis, cervical multifidi  Needle size and number: .30x51mm x 6, .30x58mm x 3 Electrical stimulation performed: No Parameters: N/A Treatment response/outcome: Twitch response elicited and Palpable decrease in muscle tension Post-treatment instructions: Patient instructed to expect possible mild to moderate muscle soreness  later today and/or tomorrow. Patient instructed in methods to reduce muscle soreness and to continue prescribed HEP. If patient was dry needled over the lung field, patient was instructed on signs and symptoms of pneumothorax and, however unlikely, to see immediate medical attention should they occur. Patient was also educated on signs and symptoms of infection and to seek medical attention should they occur. Patient verbalized understanding of these instructions and education.   [facility] Adult PT Treatment:                                                DATE: 09/14/22 Manual Therapy: Skilled palpation and monitoring of muscle tension while performing TPDN STM Bilat upper traps, levator scapulae, cervical paraspinals, suboccipitals, and ant temporalis Gentle suboccipital release and manual traction Trigger Point Dry Needling Treatment: Pre-treatment instruction: Patient instructed on dry needling rationale, procedures, and possible side effects including pain during treatment (achy,cramping feeling), bruising, drop of blood, lightheadedness, nausea, sweating. Patient Consent Given: Yes Education handout provided: Previously provided Muscles treated: Bilateral suboccipital, upper cervical paraspinals, ant temporalis Needle size and number: .30x74mm x 2 and .30x100mm x2 Electrical stimulation performed: No Parameters: N/A Treatment response/outcome: Twitch response elicited and Palpable decrease in muscle tension Post-treatment instructions: Patient instructed to expect possible mild to moderate muscle soreness later today and/or tomorrow. Patient instructed in methods to reduce muscle soreness and to continue prescribed HEP. If patient was dry needled over the lung field, patient was instructed on signs and symptoms of pneumothorax and, however unlikely, to see immediate medical attention should they occur. Patient was also educated on signs and symptoms of infection and to seek medical attention should  they occur. Patient verbalized understanding of these instructions and education. Therapeutic Exercise: Supine chin tucks x10 3" Chin tuck c cervical rotation c gentle pt overpressure x3 15" Chin tuck c sidebending x3 15"  [facility] Adult PT Treatment:                                                DATE: 09/12/22 Manual Therapy: Skilled palpation and monitoring of muscle tension while performing TPDN STM right upper trap, levator scapulae, cervical paraspinals, rhomboids Gentle suboccipital release and manual traction x 4 bouts Passive cervical retraction, upper trap, and levator stretch Gentle cervical mobs C2-C5 Trigger Point Dry Needling Treatment: Pre-treatment instruction: Patient instructed on dry needling rationale, procedures, and possible side effects including pain during treatment (achy,cramping feeling), bruising, drop of blood, lightheadedness, nausea, sweating. Patient Consent Given: Yes Education handout provided: Previously provided Muscles treated: Bilateral upper trap, right rhomboid region, suboccipital, splenius cervicis,cervical multifidi  Needle size and number: .30x1mm x 8 Electrical stimulation performed:  No Parameters: N/A Treatment response/outcome: Twitch response elicited and Palpable decrease in muscle tension Post-treatment instructions: Patient instructed to expect possible mild to moderate muscle soreness later today and/or tomorrow. Patient instructed in methods to reduce muscle soreness and to continue prescribed HEP. If patient was dry needled over the lung field, patient was instructed on signs and symptoms of pneumothorax and, however unlikely, to see immediate medical attention should they occur. Patient was also educated on signs and symptoms of infection and to seek medical attention should they occur. Patient verbalized understanding of these instructions and education. Modalities: MHP to neck/shoulders in prone x 8 min    PATIENT EDUCATION:  Education  details: HEP update Person educated: Patient Education method: Explanation, demo, cues, Handout Education comprehension: verbalized understanding, returned demo, cues    HOME EXERCISE PROGRAM: Access Code: [email or web address]    ASSESSMENT: CLINICAL IMPRESSION: Patient tolerated therapy well with no adverse effects. She continues to demonstrate limitation and tightness with cervical and shoulder motion and has increased muscle tension of right cervical and periscapular musculature. Therapy continues to utilize TPDN with good therapeutic benefit and focus on mobility and postural control. Used peanut ball for spinal and muscular mobilization this visit and applied k-tape for postural correction to avoid rounded and elevated shoulders at resting posture. Updated her HEP with good tolerance Patient would benefit from continued skilled PT to progress her mobility and strength in order to reduce pain and maximize functional ability, so will extend PT POC for 6 more weeks.    OBJECTIVE IMPAIRMENTS: decreased activity tolerance, decreased knowledge of condition, decreased mobility, decreased ROM, decreased strength, increased fascial restrictions, impaired flexibility, impaired UE functional use, improper body mechanics, postural dysfunction, and pain.    ACTIVITY LIMITATIONS: carrying, lifting, and reach over head   PARTICIPATION LIMITATIONS: meal prep, cleaning, laundry, driving, shopping, occupation, and yard work   PERSONAL FACTORS: Age, Profession, and Time since onset of injury/illness/exacerbation are also affecting patient's functional outcome.      GOALS: Goals reviewed with patient? Yes   SHORT TERM GOALS: Target date: 08/21/2022   Patient will be independent and compliant with initial HEP.    Baseline: issued at eval  Goal status: met   2.  Patient will demonstrate at least 70 degrees of Lt cervical rotation AROM to improve ability to complete head turns while driving.  Baseline: see above   08/24/2022: 64 deg 08/31/22: see flow sheets Goal status: partially met   3.  Patient will demonstrate at least 50 degrees of cervical flexion AROM to improve tolerance to reading and work tasks.  Baseline: see above  Goal status: met   LONG TERM GOALS: Target date: 11/02/2022   Patient will demonstrate at least 160 degrees of Rt shoulder flexion AROM to improve ability to reach overhead at work.  Baseline: see above  09/21/2022: 150 deg Goal status: ONGOING   2.  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  Baseline: unable  09/21/2022: patient reports continued limitation with reaching overhead Goal status: ONGOING   3.  Patient will score at least 66% on FOTO to signify clinically meaningful improvement in functional abilities.  Baseline: see above  09/21/2022: 47% Goal status: ONGOING   4.  Patient will report pain as </=2/10 to reduce her current functional limitations.  Baseline: see above  09/21/2022: 7/10 Goal status: ONGOING     PLAN: PT FREQUENCY: 1-2x/week  PT DURATION: 6 weeks   PLANNED INTERVENTIONS: Therapeutic exercises, Therapeutic activity, Neuromuscular re-education, Patient/Family education, Self Care, Joint mobilization, Dry Needling, Spinal manipulation, Spinal mobilization, Cryotherapy, Moist heat, Traction, Manual therapy, and Re-evaluation   PLAN FOR NEXT SESSION: Review and progress HEP prn; manual/TPDN to cervical region, cervical/shoulder mobility, strengthening as tolerated, repeated movement   Rosana Hoes, PT, DPT, LAT, ATC 09/21/22  5:17 PM Phone: [phone] Fax: [phone]

## 2022-09-21 ENCOUNTER — Other Ambulatory Visit: Payer: Self-pay

## 2022-09-21 ENCOUNTER — Encounter: Payer: Self-pay | Admitting: Physical Therapy

## 2022-09-21 ENCOUNTER — Ambulatory Visit: Payer: PRIVATE HEALTH INSURANCE | Attending: Family Medicine | Admitting: Physical Therapy

## 2022-09-21 DIAGNOSIS — R293 Abnormal posture: Secondary | ICD-10-CM | POA: Insufficient documentation

## 2022-09-21 DIAGNOSIS — M542 Cervicalgia: Secondary | ICD-10-CM | POA: Insufficient documentation

## 2022-09-21 DIAGNOSIS — R252 Cramp and spasm: Secondary | ICD-10-CM | POA: Insufficient documentation

## 2022-09-21 DIAGNOSIS — M6281 Muscle weakness (generalized): Secondary | ICD-10-CM | POA: Insufficient documentation

## 2022-09-21 NOTE — Patient Instructions (Signed)
Access Code: [email or web address] URL: [email or web address] Date: 09/21/2022 Prepared by: Rosana Hoes  Exercises - Seated Upper Trapezius Stretch  - 2 x daily - 7 x weekly - 2 sets - 20 sec  hold - Seated Levator Scapulae Stretch  - 2 x daily - 7 x weekly - 2 sets - 20 sec  hold - Seated Cervical Retraction and Extension  - 6 x daily - 7 x weekly - 1 sets - 10 reps - Sidelying Thoracic Lumbar Rotation  - 2 x daily - 7 x weekly - 10 reps - Standing Shoulder Row with Anchored Resistance  - 1 x daily - 7 x weekly - 2 sets - 10 reps - Serratus Activation at Wall  - 1 x daily - 7 x weekly - 2 sets - 10 reps - Supine Suboccipital Release with Tennis Balls  - 2 x daily - 10 reps - 5 seconds hold - Supine Mid-Thoracic Spine Mobilization with Taped Tennis Balls Shoulder Flexion  - 2 x daily - 10 reps - 5 seconds hold - Thoracic Mobilization on Foam Roll - Hands Clasped  - 2 x daily - 10 reps - 5 seconds hold - Open Book Chest Rotation Stretch on Foam 1/2 Roll  - 2 x daily - 3 reps - 60 seconds hold

## 2022-09-27 ENCOUNTER — Ambulatory Visit: Payer: PRIVATE HEALTH INSURANCE | Attending: Family Medicine

## 2022-09-27 DIAGNOSIS — M6281 Muscle weakness (generalized): Secondary | ICD-10-CM | POA: Insufficient documentation

## 2022-09-27 DIAGNOSIS — R293 Abnormal posture: Secondary | ICD-10-CM | POA: Insufficient documentation

## 2022-09-27 DIAGNOSIS — R252 Cramp and spasm: Secondary | ICD-10-CM | POA: Insufficient documentation

## 2022-09-27 DIAGNOSIS — M542 Cervicalgia: Secondary | ICD-10-CM | POA: Insufficient documentation

## 2022-09-27 NOTE — Therapy (Signed)
OUTPATIENT PHYSICAL THERAPY TREATMENT NOTE   Patient Name: Brenda Mcmahon MRN: [id number] DOB:1991-01-17, 32 y.o., female Today's Date: 09/27/2022  PCP: Blane Ohara, MD  REFERRING PROVIDER: Lanell Persons, MD       END OF SESSION:   PT End of Session - 09/27/22 1516     Visit Number 12    Number of Visits 18    Date for PT Re-Evaluation 11/02/22    Authorization Type Worker's comp 5/16- 6 new visits approved    Authorization - Visit Number 12    Authorization - Number of Visits 18    PT Start Time 1524    PT Stop Time 1608    PT Time Calculation (min) 44 min    Activity Tolerance Patient tolerated treatment well    Behavior During Therapy WFL for tasks assessed/performed                     Past Medical History:  Diagnosis Date   Palpitation    Past Surgical History:  Procedure Laterality Date   FOOT FRACTURE SURGERY Right 2015   also ankle fracture repaired.    WRIST FRACTURE SURGERY Left    32 yo.  fell through glass door.    Patient Active Problem List   Diagnosis Date Noted   Encounter for general adult medical examination without abnormal findings 09/18/2021   Need for DTaP vaccine 09/18/2021   Moderate recurrent major depression (HCC) 08/07/2021   GAD (generalized anxiety disorder) 08/07/2021   Encounter for other contraceptive management 08/07/2021   Palpitation 11/10/2020   Achilles bursitis of right lower extremity 03/14/2017   Depression, major, single episode, moderate (HCC) 03/14/2017    REFERRING DIAG: W09.81XB (ICD-10-CM) - Contusion of other specified part of neck, subsequent encounter   THERAPY DIAG:  Cervicalgia  Cramp and spasm  Abnormal posture  Muscle weakness (generalized)  Rationale for Evaluation and Treatment Rehabilitation  PERTINENT HISTORY: Work-related injury on 06/30/22   PRECAUTIONS: none    SUBJECTIVE:                                                                                                                                                                                      SUBJECTIVE STATEMENT:  Patient reports the tape was helpful and didn't feel as much pulling in the neck when it was applied. Today her whole back hurts.   PAIN:  Are you having pain? Yes:  NPRS scale: 7/10 Pain location: Rt side of neck, back Pain description: sharp, dull  Aggravating factors: arm movement  Relieving factors: heat   OBJECTIVE: (objective measures completed at initial evaluation unless otherwise dated) DIAGNOSTIC FINDINGS:  IMPRESSION: Negative cervical spine radiographs.   PATIENT SURVEYS:  FOTO 57% function to 66% predicted  09/21/2022: 47%   POSTURE:  Rounded shoulders and forward head   PALPATION: Tautness and palpable tenderness Rt upper trap, cervical paraspinals, middle trap, levator scapulae, rhomboids             CERVICAL ROM:    Active ROM A/PROM (deg) eval 08/14/22 08/24/2022 08/31/22  Flexion 42 pn 62 "pulling"     Extension 50 "sore" 50 "pulling"    Right lateral flexion 35 pn   50  Left lateral flexion 30 pn   50  Right rotation 80 "pull"   80  Left rotation 60 pn  65 65   (Blank rows = not tested)   UPPER EXTREMITY ROM:   Active ROM Right eval Left eval Right 09/21/2022 Right 09/27/22  Shoulder flexion 145 "sore" Full  150 170 pain upper trap  Shoulder extension        Shoulder abduction        Shoulder adduction        Shoulder extension        Shoulder internal rotation        Shoulder external rotation        Elbow flexion        Elbow extension        Wrist flexion        Wrist extension        Wrist ulnar deviation        Wrist radial deviation        Wrist pronation        Wrist supination         (Blank rows = not tested)   UPPER EXTREMITY MMT:   MMT Right eval Left eval Right 08/24/2022  Shoulder flexion 4+ pn 5   Shoulder extension       Shoulder abduction 5 5   Shoulder adduction       Shoulder extension       Shoulder internal  rotation       Shoulder external rotation       Middle trapezius 4+ pn 4+  4  Lower trapezius 4- pn 4-  4-  Elbow flexion       Elbow extension       Wrist flexion       Wrist extension       Wrist ulnar deviation       Wrist radial deviation       Wrist pronation       Wrist supination       Grip strength        (Blank rows = not tested)   CERVICAL SPECIAL TESTS:  Cervical compression (-) Cervical distraction  (-)    FUNCTIONAL TESTS:  N/A   TODAY'S TREATMENT [facility] Adult PT Treatment:                                                DATE: 09/27/22 Therapeutic Exercise: UBE level 3 x 2.5 min each fwd/bwd Prone W with therapist assist scapular depression 2 x 10  Manual Therapy: K-tape I-strip from Rt upper trap O to I, I strip from coracoid process to posterior shoulder lateral and superior 50 % tension  Cervicothoracic J-thrust manipulation  Skilled palpation and monitoring of muscle tension while performing TPDN STM  right upper trap, levator scapulae, cervical paraspinals, rhomboids Pec stretching  Rt scapulothoracic mobilization inferior, upward/downward rotation  Trigger Point Dry Needling Treatment: Pre-treatment instruction: Patient instructed on dry needling rationale, procedures, and possible side effects including pain during treatment (achy,cramping feeling), bruising, drop of blood, lightheadedness, nausea, sweating. Patient Consent Given: Yes Education handout provided: Previously provided Muscles treated: rt upper trap and Rt pec major/minor   Needle size and number: .30x41mm x 2 and .25x42mm x 1 Electrical stimulation performed: No Parameters: N/A Treatment response/outcome: Twitch response elicited and Palpable decrease in muscle tension Post-treatment instructions: Patient instructed to expect possible mild to moderate muscle soreness later today and/or tomorrow. Patient instructed in methods to reduce muscle soreness and to continue prescribed HEP. If patient  was dry needled over the lung field, patient was instructed on signs and symptoms of pneumothorax and, however unlikely, to see immediate medical attention should they occur. Patient was also educated on signs and symptoms of infection and to seek medical attention should they occur. Patient verbalized understanding of these instructions and education.    [facility] Adult PT Treatment:                                                DATE: 09/21/22 Therapeutic Exercise: Sidelying thoracic rotation x 5 each Supine thoracic extension mobs over FR Supine chin tuck with peanut ball Supine thoracic extension over peanut ball with shoulder flexion and serratus punch Supine pec stretch on FR Manual Therapy: Skilled palpation and monitoring of muscle tension while performing TPDN STM right upper trap, levator scapulae, cervical paraspinals, rhomboids Gentle suboccipital release and manual traction Passive cervical retraction, upper trap, and levator stretch K-tape for shoulder retraction/depression Trigger Point Dry Needling Treatment: Pre-treatment instruction: Patient instructed on dry needling rationale, procedures, and possible side effects including pain during treatment (achy,cramping feeling), bruising, drop of blood, lightheadedness, nausea, sweating. Patient Consent Given: Yes Education handout provided: Previously provided Muscles treated: Right upper trap, right rhomboid region, suboccipital, splenius cervicis, cervical multifidi  Needle size and number: .30x56mm x 6, .30x12mm x 3 Electrical stimulation performed: No Parameters: N/A Treatment response/outcome: Twitch response elicited and Palpable decrease in muscle tension Post-treatment instructions: Patient instructed to expect possible mild to moderate muscle soreness later today and/or tomorrow. Patient instructed in methods to reduce muscle soreness and to continue prescribed HEP. If patient was dry needled over the lung field, patient was  instructed on signs and symptoms of pneumothorax and, however unlikely, to see immediate medical attention should they occur. Patient was also educated on signs and symptoms of infection and to seek medical attention should they occur. Patient verbalized understanding of these instructions and education.   [facility] Adult PT Treatment:                                                DATE: 09/14/22 Manual Therapy: Skilled palpation and monitoring of muscle tension while performing TPDN STM Bilat upper traps, levator scapulae, cervical paraspinals, suboccipitals, and ant temporalis Gentle suboccipital release and manual traction Trigger Point Dry Needling Treatment: Pre-treatment instruction: Patient instructed on dry needling rationale, procedures, and possible side effects including pain during treatment (achy,cramping feeling), bruising, drop of blood, lightheadedness, nausea, sweating. Patient Consent Given: Yes  Education handout provided: Previously provided Muscles treated: Bilateral suboccipital, upper cervical paraspinals, ant temporalis Needle size and number: .30x79mm x 2 and .30x76mm x2 Electrical stimulation performed: No Parameters: N/A Treatment response/outcome: Twitch response elicited and Palpable decrease in muscle tension Post-treatment instructions: Patient instructed to expect possible mild to moderate muscle soreness later today and/or tomorrow. Patient instructed in methods to reduce muscle soreness and to continue prescribed HEP. If patient was dry needled over the lung field, patient was instructed on signs and symptoms of pneumothorax and, however unlikely, to see immediate medical attention should they occur. Patient was also educated on signs and symptoms of infection and to seek medical attention should they occur. Patient verbalized understanding of these instructions and education. Therapeutic Exercise: Supine chin tucks x10 3" Chin tuck c cervical rotation c gentle pt  overpressure x3 15" Chin tuck c sidebending x3 15"     PATIENT EDUCATION:  Education details: HEP update Person educated: Patient Education method: Explanation, demo, cues, Handout Education comprehension: verbalized understanding, returned demo, cues    HOME EXERCISE PROGRAM: Access Code: [email or web address]    ASSESSMENT: CLINICAL IMPRESSION: Patient tolerated therapy well with no adverse effects. Performed further TPDN to Rt upper trap and pectorals with twitch response elicited. K-tape was applied again for postural correction with patient reporting an immediate reduction in neck tension/pulling sensation following application. She reported initial pain with prone W, but when therapist applies assistance to maintain scapular depression this pain was relieved. Shoulder flexion AROM has improved compared to last visit, but continues to endorse pain at end range.    OBJECTIVE IMPAIRMENTS: decreased activity tolerance, decreased knowledge of condition, decreased mobility, decreased ROM, decreased strength, increased fascial restrictions, impaired flexibility, impaired UE functional use, improper body mechanics, postural dysfunction, and pain.    ACTIVITY LIMITATIONS: carrying, lifting, and reach over head   PARTICIPATION LIMITATIONS: meal prep, cleaning, laundry, driving, shopping, occupation, and yard work   PERSONAL FACTORS: Age, Profession, and Time since onset of injury/illness/exacerbation are also affecting patient's functional outcome.      GOALS: Goals reviewed with patient? Yes   SHORT TERM GOALS: Target date: 08/21/2022   Patient will be independent and compliant with initial HEP.    Baseline: issued at eval  Goal status: met   2.  Patient will demonstrate at least 70 degrees of Lt cervical rotation AROM to improve ability to complete head turns while driving.  Baseline: see above  08/24/2022: 64 deg 08/31/22: see flow sheets Goal status: partially met   3.  Patient will  demonstrate at least 50 degrees of cervical flexion AROM to improve tolerance to reading and work tasks.  Baseline: see above  Goal status: met   LONG TERM GOALS: Target date: 11/02/2022   Patient will demonstrate at least 160 degrees of Rt shoulder flexion AROM to improve ability to reach overhead at work.  Baseline: see above  09/21/2022: 150 deg Goal status: ONGOING   2.  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  Baseline: unable  09/21/2022: patient reports continued limitation with reaching overhead Goal status: ONGOING   3.  Patient will score at least 66% on FOTO to signify clinically meaningful improvement in functional abilities.  Baseline: see above  09/21/2022: 47% Goal status: ONGOING   4.  Patient will report pain as </=2/10 to reduce her current functional limitations.  Baseline: see above  09/21/2022: 7/10  Goal status: ONGOING     PLAN: PT FREQUENCY: 1-2x/week   PT DURATION: 6 weeks   PLANNED INTERVENTIONS: Therapeutic exercises, Therapeutic activity, Neuromuscular re-education, Patient/Family education, Self Care, Joint mobilization, Dry Needling, Spinal manipulation, Spinal mobilization, Cryotherapy, Moist heat, Traction, Manual therapy, and Re-evaluation   PLAN FOR NEXT SESSION: Review and progress HEP prn; manual/TPDN to cervical region, cervical/shoulder mobility, strengthening as tolerated,  Letitia Libra, PT, DPT, ATC 09/27/22 4:11 PM

## 2022-09-28 ENCOUNTER — Ambulatory Visit: Payer: PRIVATE HEALTH INSURANCE

## 2022-09-28 DIAGNOSIS — M542 Cervicalgia: Secondary | ICD-10-CM

## 2022-09-28 DIAGNOSIS — R293 Abnormal posture: Secondary | ICD-10-CM

## 2022-09-28 DIAGNOSIS — M6281 Muscle weakness (generalized): Secondary | ICD-10-CM

## 2022-09-28 DIAGNOSIS — R252 Cramp and spasm: Secondary | ICD-10-CM

## 2022-09-28 NOTE — Therapy (Signed)
OUTPATIENT PHYSICAL THERAPY TREATMENT NOTE   Patient Name: Brenda Mcmahon MRN: [id number] DOB:08-30-1990, 32 y.o., female Today's Date: 09/29/2022  PCP: Blane Ohara, MD  REFERRING PROVIDER: Lanell Persons, MD       END OF SESSION:   PT End of Session - 09/28/22 1524     Visit Number 13    Number of Visits 18    Date for PT Re-Evaluation 11/02/22    Authorization Type Worker's comp 5/16- 6 new visits approved    Authorization - Visit Number 13    Authorization - Number of Visits 18    PT Start Time 1526    PT Stop Time 1621    PT Time Calculation (min) 55 min    Activity Tolerance Patient tolerated treatment well    Behavior During Therapy WFL for tasks assessed/performed                      Past Medical History:  Diagnosis Date   Palpitation    Past Surgical History:  Procedure Laterality Date   FOOT FRACTURE SURGERY Right 2015   also ankle fracture repaired.    WRIST FRACTURE SURGERY Left    32 yo.  fell through glass door.    Patient Active Problem List   Diagnosis Date Noted   Encounter for general adult medical examination without abnormal findings 09/18/2021   Need for DTaP vaccine 09/18/2021   Moderate recurrent major depression (HCC) 08/07/2021   GAD (generalized anxiety disorder) 08/07/2021   Encounter for other contraceptive management 08/07/2021   Palpitation 11/10/2020   Achilles bursitis of right lower extremity 03/14/2017   Depression, major, single episode, moderate (HCC) 03/14/2017    REFERRING DIAG: W09.81XB (ICD-10-CM) - Contusion of other specified part of neck, subsequent encounter   THERAPY DIAG:  Cervicalgia  Cramp and spasm  Abnormal posture  Muscle weakness (generalized)  Rationale for Evaluation and Treatment Rehabilitation  PERTINENT HISTORY: Work-related injury on 06/30/22   PRECAUTIONS: none    SUBJECTIVE:                                                                                                                                                                                      SUBJECTIVE STATEMENT:  Patient reports she was feeling good after yesterday's session, but she is hurting now since being at work.   PAIN:  Are you having pain? Yes:  NPRS scale: 10/10 Pain location: Rt side of neck Pain description: sharp Aggravating factors: arm movement  Relieving factors: heat   OBJECTIVE: (objective measures completed at initial evaluation unless otherwise dated) DIAGNOSTIC FINDINGS:  IMPRESSION: Negative cervical spine radiographs.   PATIENT  SURVEYS:  FOTO 57% function to 66% predicted  09/21/2022: 47%   POSTURE:  Rounded shoulders and forward head   PALPATION: Tautness and palpable tenderness Rt upper trap, cervical paraspinals, middle trap, levator scapulae, rhomboids             CERVICAL ROM:    Active ROM A/PROM (deg) eval 08/14/22 08/24/2022 08/31/22  Flexion 42 pn 62 "pulling"     Extension 50 "sore" 50 "pulling"    Right lateral flexion 35 pn   50  Left lateral flexion 30 pn   50  Right rotation 80 "pull"   80  Left rotation 60 pn  65 65   (Blank rows = not tested)   UPPER EXTREMITY ROM:   Active ROM Right eval Left eval Right 09/21/2022 Right 09/27/22  Shoulder flexion 145 "sore" Full  150 170 pain upper trap  Shoulder extension        Shoulder abduction        Shoulder adduction        Shoulder extension        Shoulder internal rotation        Shoulder external rotation        Elbow flexion        Elbow extension        Wrist flexion        Wrist extension        Wrist ulnar deviation        Wrist radial deviation        Wrist pronation        Wrist supination         (Blank rows = not tested)   UPPER EXTREMITY MMT:   MMT Right eval Left eval Right 08/24/2022  Shoulder flexion 4+ pn 5   Shoulder extension       Shoulder abduction 5 5   Shoulder adduction       Shoulder extension       Shoulder internal rotation       Shoulder external  rotation       Middle trapezius 4+ pn 4+  4  Lower trapezius 4- pn 4-  4-  Elbow flexion       Elbow extension       Wrist flexion       Wrist extension       Wrist ulnar deviation       Wrist radial deviation       Wrist pronation       Wrist supination       Grip strength        (Blank rows = not tested)   CERVICAL SPECIAL TESTS:  Cervical compression (-) Cervical distraction  (-)    FUNCTIONAL TESTS:  N/A   TODAY'S TREATMENT [facility] Adult PT Treatment:                                                DATE: 09/28/22 Therapeutic Exercise:  Supine cervical retraction with extension x 10  Manual Therapy: Rt shoulder PROM into abduction and flexion Rt GHJ mobilizations inferior and posterior STM right upper trap, levator scapulae, cervical paraspinals, rhomboids, posterior rotator cuff, pectorals C-spine CPAs grade II-III Manual cervical distraction 3 x 1 minute K-tape I-strip from Rt upper trap O to I, I strip from coracoid process to posterior shoulder lateral and superior  50 % tension  Trigger Point Dry Needling Treatment: Pre-treatment instruction: Patient instructed on dry needling rationale, procedures, and possible side effects including pain during treatment (achy,cramping feeling), bruising, drop of blood, lightheadedness, nausea, sweating. Patient Consent Given: Yes Education handout provided: Previously provided Muscles treated: Rt infraspinatus and C5 multifidi   Needle size and number: .30x47mm x 1 and .25x57mm x 1 Electrical stimulation performed: No Parameters: N/A Treatment response/outcome: Twitch response elicited and Palpable decrease in muscle tension Post-treatment instructions: Patient instructed to expect possible mild to moderate muscle soreness later today and/or tomorrow. Patient instructed in methods to reduce muscle soreness and to continue prescribed HEP. If patient was dry needled over the lung field, patient was instructed on signs and symptoms of  pneumothorax and, however unlikely, to see immediate medical attention should they occur. Patient was also educated on signs and symptoms of infection and to seek medical attention should they occur. Patient verbalized understanding of these instructions and education.   Modalities: Mechanical cervical traction, intermittent min 10 lbs, max 25 lbs 3 step progressive/regressive x 1 minute, 10 minutes     [facility] Adult PT Treatment:                                                DATE: 09/27/22 Therapeutic Exercise: UBE level 3 x 2.5 min each fwd/bwd Prone W with therapist assist scapular depression 2 x 10  Manual Therapy: K-tape I-strip from Rt upper trap O to I, I strip from coracoid process to posterior shoulder lateral and superior 50 % tension  Cervicothoracic J-thrust manipulation  Skilled palpation and monitoring of muscle tension while performing TPDN STM right upper trap, levator scapulae, cervical paraspinals, rhomboids Pec stretching  Rt scapulothoracic mobilization inferior, upward/downward rotation  Trigger Point Dry Needling Treatment: Pre-treatment instruction: Patient instructed on dry needling rationale, procedures, and possible side effects including pain during treatment (achy,cramping feeling), bruising, drop of blood, lightheadedness, nausea, sweating. Patient Consent Given: Yes Education handout provided: Previously provided Muscles treated: rt upper trap and Rt pec major/minor   Needle size and number: .30x76mm x 2 and .25x58mm x 1 Electrical stimulation performed: No Parameters: N/A Treatment response/outcome: Twitch response elicited and Palpable decrease in muscle tension Post-treatment instructions: Patient instructed to expect possible mild to moderate muscle soreness later today and/or tomorrow. Patient instructed in methods to reduce muscle soreness and to continue prescribed HEP. If patient was dry needled over the lung field, patient was instructed on signs and  symptoms of pneumothorax and, however unlikely, to see immediate medical attention should they occur. Patient was also educated on signs and symptoms of infection and to seek medical attention should they occur. Patient verbalized understanding of these instructions and education.    [facility] Adult PT Treatment:                                                DATE: 09/21/22 Therapeutic Exercise: Sidelying thoracic rotation x 5 each Supine thoracic extension mobs over FR Supine chin tuck with peanut ball Supine thoracic extension over peanut ball with shoulder flexion and serratus punch Supine pec stretch on FR Manual Therapy: Skilled palpation and monitoring of muscle tension while performing TPDN STM right upper trap, levator scapulae, cervical  paraspinals, rhomboids Gentle suboccipital release and manual traction Passive cervical retraction, upper trap, and levator stretch K-tape for shoulder retraction/depression Trigger Point Dry Needling Treatment: Pre-treatment instruction: Patient instructed on dry needling rationale, procedures, and possible side effects including pain during treatment (achy,cramping feeling), bruising, drop of blood, lightheadedness, nausea, sweating. Patient Consent Given: Yes Education handout provided: Previously provided Muscles treated: Right upper trap, right rhomboid region, suboccipital, splenius cervicis, cervical multifidi  Needle size and number: .30x77mm x 6, .30x36mm x 3 Electrical stimulation performed: No Parameters: N/A Treatment response/outcome: Twitch response elicited and Palpable decrease in muscle tension Post-treatment instructions: Patient instructed to expect possible mild to moderate muscle soreness later today and/or tomorrow. Patient instructed in methods to reduce muscle soreness and to continue prescribed HEP. If patient was dry needled over the lung field, patient was instructed on signs and symptoms of pneumothorax and, however unlikely, to  see immediate medical attention should they occur. Patient was also educated on signs and symptoms of infection and to seek medical attention should they occur. Patient verbalized understanding of these instructions and education.      PATIENT EDUCATION:  Education details: HEP update Person educated: Patient Education method: Explanation, demo, cues, Handout Education comprehension: verbalized understanding, returned demo, cues    HOME EXERCISE PROGRAM: Access Code: [email or web address]    ASSESSMENT: CLINICAL IMPRESSION: Patient tolerated therapy well with no adverse effects. She arrives with 10/10 pain isolated to the Rt upper trap, though in NAD. Further TPDN was performed to Rt shoulder and cervical musculature with good tolerance. She reported a reduction in her pain with manual cervical distraction, so a trial of mechanical cervical traction was completed. Following traction she reported a significant improvement in her pain rated as a 2/10. No changes were made to HEP.    OBJECTIVE IMPAIRMENTS: decreased activity tolerance, decreased knowledge of condition, decreased mobility, decreased ROM, decreased strength, increased fascial restrictions, impaired flexibility, impaired UE functional use, improper body mechanics, postural dysfunction, and pain.    ACTIVITY LIMITATIONS: carrying, lifting, and reach over head   PARTICIPATION LIMITATIONS: meal prep, cleaning, laundry, driving, shopping, occupation, and yard work   PERSONAL FACTORS: Age, Profession, and Time since onset of injury/illness/exacerbation are also affecting patient's functional outcome.      GOALS: Goals reviewed with patient? Yes   SHORT TERM GOALS: Target date: 08/21/2022   Patient will be independent and compliant with initial HEP.    Baseline: issued at eval  Goal status: met   2.  Patient will demonstrate at least 70 degrees of Lt cervical rotation AROM to improve ability to complete head turns while driving.   Baseline: see above  08/24/2022: 64 deg 08/31/22: see flow sheets Goal status: partially met   3.  Patient will demonstrate at least 50 degrees of cervical flexion AROM to improve tolerance to reading and work tasks.  Baseline: see above  Goal status: met   LONG TERM GOALS: Target date: 11/02/2022   Patient will demonstrate at least 160 degrees of Rt shoulder flexion AROM to improve ability to reach overhead at work.  Baseline: see above  09/21/2022: 150 deg Goal status: ONGOING   2.  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  Baseline: unable  09/21/2022: patient reports continued limitation with reaching overhead Goal status: ONGOING   3.  Patient will score at least 66% on FOTO to signify clinically meaningful improvement in  functional abilities.  Baseline: see above  09/21/2022: 47% Goal status: ONGOING   4.  Patient will report pain as </=2/10 to reduce her current functional limitations.  Baseline: see above  09/21/2022: 7/10 Goal status: ONGOING     PLAN: PT FREQUENCY: 1-2x/week   PT DURATION: 6 weeks   PLANNED INTERVENTIONS: Therapeutic exercises, Therapeutic activity, Neuromuscular re-education, Patient/Family education, Self Care, Joint mobilization, Dry Needling, Spinal manipulation, Spinal mobilization, Cryotherapy, Moist heat, Traction, Manual therapy, and Re-evaluation   PLAN FOR NEXT SESSION: Review and progress HEP prn; manual/TPDN to cervical region, cervical/shoulder mobility, strengthening as tolerated. Response to traction.   Letitia Libra, PT, DPT, ATC 09/29/22 10:03 AM

## 2022-10-04 ENCOUNTER — Ambulatory Visit: Payer: PRIVATE HEALTH INSURANCE

## 2022-10-04 DIAGNOSIS — R252 Cramp and spasm: Secondary | ICD-10-CM

## 2022-10-04 DIAGNOSIS — M6281 Muscle weakness (generalized): Secondary | ICD-10-CM

## 2022-10-04 DIAGNOSIS — M542 Cervicalgia: Secondary | ICD-10-CM | POA: Diagnosis not present

## 2022-10-04 DIAGNOSIS — R293 Abnormal posture: Secondary | ICD-10-CM

## 2022-10-04 NOTE — Therapy (Signed)
OUTPATIENT PHYSICAL THERAPY TREATMENT NOTE   Patient Name: Brenda Mcmahon MRN: [id number] DOB:07-18-1990, 32 y.o., female Today's Date: 10/04/2022  PCP: Blane Ohara, MD  REFERRING PROVIDER: Lanell Persons, MD       END OF SESSION:   PT End of Session - 10/04/22 0837     Visit Number 14    Number of Visits 18    Date for PT Re-Evaluation 11/02/22    Authorization Type Worker's comp 5/16- 6 new visits approved    Authorization - Visit Number 14    Authorization - Number of Visits 18    PT Start Time 0844    PT Stop Time 0945    PT Time Calculation (min) 61 min    Activity Tolerance Patient tolerated treatment well    Behavior During Therapy WFL for tasks assessed/performed                      Past Medical History:  Diagnosis Date   Palpitation    Past Surgical History:  Procedure Laterality Date   FOOT FRACTURE SURGERY Right 2015   also ankle fracture repaired.    WRIST FRACTURE SURGERY Left    32 yo.  fell through glass door.    Patient Active Problem List   Diagnosis Date Noted   Encounter for general adult medical examination without abnormal findings 09/18/2021   Need for DTaP vaccine 09/18/2021   Moderate recurrent major depression (HCC) 08/07/2021   GAD (generalized anxiety disorder) 08/07/2021   Encounter for other contraceptive management 08/07/2021   Palpitation 11/10/2020   Achilles bursitis of right lower extremity 03/14/2017   Depression, major, single episode, moderate (HCC) 03/14/2017    REFERRING DIAG: N82.95AO (ICD-10-CM) - Contusion of other specified part of neck, subsequent encounter   THERAPY DIAG:  Cervicalgia  Cramp and spasm  Abnormal posture  Muscle weakness (generalized)  Rationale for Evaluation and Treatment Rehabilitation  PERTINENT HISTORY: Work-related injury on 06/30/22   PRECAUTIONS: none    SUBJECTIVE:                                                                                                                                                                                      SUBJECTIVE STATEMENT:  "I'm ok for right now. The IV room is when I feel everything the most." She feels that the traction was helpful in reducing the pressure in her Rt upper trap last time.   PAIN:  Are you having pain? Yes:  NPRS scale: 3/10 Pain location: Rt side of neck Pain description: sharp Aggravating factors: arm movement  Relieving factors: heat   OBJECTIVE: (objective measures completed at initial  evaluation unless otherwise dated) DIAGNOSTIC FINDINGS:  IMPRESSION: Negative cervical spine radiographs.   PATIENT SURVEYS:  FOTO 57% function to 66% predicted  09/21/2022: 47%   POSTURE:  Rounded shoulders and forward head   PALPATION: Tautness and palpable tenderness Rt upper trap, cervical paraspinals, middle trap, levator scapulae, rhomboids             CERVICAL ROM:    Active ROM A/PROM (deg) eval 08/14/22 08/24/2022 08/31/22  Flexion 42 pn 62 "pulling"     Extension 50 "sore" 50 "pulling"    Right lateral flexion 35 pn   50  Left lateral flexion 30 pn   50  Right rotation 80 "pull"   80  Left rotation 60 pn  65 65   (Blank rows = not tested)   UPPER EXTREMITY ROM:   Active ROM Right eval Left eval Right 09/21/2022 Right 09/27/22  Shoulder flexion 145 "sore" Full  150 170 pain upper trap  Shoulder extension        Shoulder abduction        Shoulder adduction        Shoulder extension        Shoulder internal rotation        Shoulder external rotation        Elbow flexion        Elbow extension        Wrist flexion        Wrist extension        Wrist ulnar deviation        Wrist radial deviation        Wrist pronation        Wrist supination         (Blank rows = not tested)   UPPER EXTREMITY MMT:   MMT Right eval Left eval Right 08/24/2022  Shoulder flexion 4+ pn 5   Shoulder extension       Shoulder abduction 5 5   Shoulder adduction       Shoulder extension        Shoulder internal rotation       Shoulder external rotation       Middle trapezius 4+ pn 4+  4  Lower trapezius 4- pn 4-  4-  Elbow flexion       Elbow extension       Wrist flexion       Wrist extension       Wrist ulnar deviation       Wrist radial deviation       Wrist pronation       Wrist supination       Grip strength        (Blank rows = not tested)   CERVICAL SPECIAL TESTS:  Cervical compression (-) Cervical distraction  (-)    FUNCTIONAL TESTS:  N/A   TODAY'S TREATMENT [facility] Adult PT Treatment:                                                DATE: 10/04/22 Therapeutic Exercise: Pec stretch on roller x 2 minutes Single arm serratus punch on roller 2 x 10  Prone single arm row 2 x 10  Manual Therapy: Manual cervical distraction 3 x 1 minute K-tape I-strip from Rt upper trap O to I, I strip from coracoid process to posterior shoulder lateral and  superior 50 % tension STM Rt upper trap, levator scapulae, cervical paraspinals Passive Rt upper trap and levator scapulae stretch   Modalities: Mechanical cervical traction, intermittent min 10 lbs, max 25 lbs 3 step progressive/regressive x 1 minute, 10 minutes    [facility] Adult PT Treatment:                                                DATE: 09/28/22 Therapeutic Exercise:  Supine cervical retraction with extension x 10  Manual Therapy: Rt shoulder PROM into abduction and flexion Rt GHJ mobilizations inferior and posterior STM right upper trap, levator scapulae, cervical paraspinals, rhomboids, posterior rotator cuff, pectorals C-spine CPAs grade II-III Manual cervical distraction 3 x 1 minute K-tape I-strip from Rt upper trap O to I, I strip from coracoid process to posterior shoulder lateral and superior 50 % tension  Trigger Point Dry Needling Treatment: Pre-treatment instruction: Patient instructed on dry needling rationale, procedures, and possible side effects including pain during treatment (achy,cramping feeling),  bruising, drop of blood, lightheadedness, nausea, sweating. Patient Consent Given: Yes Education handout provided: Previously provided Muscles treated: Rt infraspinatus and C5 multifidi   Needle size and number: .30x7mm x 1 and .25x5mm x 1 Electrical stimulation performed: No Parameters: N/A Treatment response/outcome: Twitch response elicited and Palpable decrease in muscle tension Post-treatment instructions: Patient instructed to expect possible mild to moderate muscle soreness later today and/or tomorrow. Patient instructed in methods to reduce muscle soreness and to continue prescribed HEP. If patient was dry needled over the lung field, patient was instructed on signs and symptoms of pneumothorax and, however unlikely, to see immediate medical attention should they occur. Patient was also educated on signs and symptoms of infection and to seek medical attention should they occur. Patient verbalized understanding of these instructions and education.   Modalities: Mechanical cervical traction, intermittent min 10 lbs, max 25 lbs 3 step progressive/regressive x 1 minute, 10 minutes     [facility] Adult PT Treatment:                                                DATE: 09/27/22 Therapeutic Exercise: UBE level 3 x 2.5 min each fwd/bwd Prone W with therapist assist scapular depression 2 x 10  Manual Therapy: K-tape I-strip from Rt upper trap O to I, I strip from coracoid process to posterior shoulder lateral and superior 50 % tension  Cervicothoracic J-thrust manipulation  Skilled palpation and monitoring of muscle tension while performing TPDN STM right upper trap, levator scapulae, cervical paraspinals, rhomboids Pec stretching  Rt scapulothoracic mobilization inferior, upward/downward rotation  Trigger Point Dry Needling Treatment: Pre-treatment instruction: Patient instructed on dry needling rationale, procedures, and possible side effects including pain during treatment (achy,cramping  feeling), bruising, drop of blood, lightheadedness, nausea, sweating. Patient Consent Given: Yes Education handout provided: Previously provided Muscles treated: rt upper trap and Rt pec major/minor   Needle size and number: .30x18mm x 2 and .25x34mm x 1 Electrical stimulation performed: No Parameters: N/A Treatment response/outcome: Twitch response elicited and Palpable decrease in muscle tension Post-treatment instructions: Patient instructed to expect possible mild to moderate muscle soreness later today and/or tomorrow. Patient instructed in methods to reduce muscle soreness and to continue prescribed HEP.  If patient was dry needled over the lung field, patient was instructed on signs and symptoms of pneumothorax and, however unlikely, to see immediate medical attention should they occur. Patient was also educated on signs and symptoms of infection and to seek medical attention should they occur. Patient verbalized understanding of these instructions and education.    [facility] Adult PT Treatment:                                                DATE: 09/21/22 Therapeutic Exercise: Sidelying thoracic rotation x 5 each Supine thoracic extension mobs over FR Supine chin tuck with peanut ball Supine thoracic extension over peanut ball with shoulder flexion and serratus punch Supine pec stretch on FR Manual Therapy: Skilled palpation and monitoring of muscle tension while performing TPDN STM right upper trap, levator scapulae, cervical paraspinals, rhomboids Gentle suboccipital release and manual traction Passive cervical retraction, upper trap, and levator stretch K-tape for shoulder retraction/depression Trigger Point Dry Needling Treatment: Pre-treatment instruction: Patient instructed on dry needling rationale, procedures, and possible side effects including pain during treatment (achy,cramping feeling), bruising, drop of blood, lightheadedness, nausea, sweating. Patient Consent Given:  Yes Education handout provided: Previously provided Muscles treated: Right upper trap, right rhomboid region, suboccipital, splenius cervicis, cervical multifidi  Needle size and number: .30x65mm x 6, .30x49mm x 3 Electrical stimulation performed: No Parameters: N/A Treatment response/outcome: Twitch response elicited and Palpable decrease in muscle tension Post-treatment instructions: Patient instructed to expect possible mild to moderate muscle soreness later today and/or tomorrow. Patient instructed in methods to reduce muscle soreness and to continue prescribed HEP. If patient was dry needled over the lung field, patient was instructed on signs and symptoms of pneumothorax and, however unlikely, to see immediate medical attention should they occur. Patient was also educated on signs and symptoms of infection and to seek medical attention should they occur. Patient verbalized understanding of these instructions and education.      PATIENT EDUCATION:  Education details: HEP review Person educated: Patient Education method: Explanation Education comprehension: verbalized understanding   HOME EXERCISE PROGRAM: Access Code: [email or web address]    ASSESSMENT: CLINICAL IMPRESSION: Patient tolerated therapy well with no adverse effects. She feels that traction was very helpful in reducing her pain at last session, so continued with this modality at conclusion of today's session with good therapeutic benefit. Able to progress postural strengthening with patient having difficulty controlling for excessive upper trap engagement with majority of there ex. No increase in pain reported throughout session.    OBJECTIVE IMPAIRMENTS: decreased activity tolerance, decreased knowledge of condition, decreased mobility, decreased ROM, decreased strength, increased fascial restrictions, impaired flexibility, impaired UE functional use, improper body mechanics, postural dysfunction, and pain.    ACTIVITY LIMITATIONS:  carrying, lifting, and reach over head   PARTICIPATION LIMITATIONS: meal prep, cleaning, laundry, driving, shopping, occupation, and yard work   PERSONAL FACTORS: Age, Profession, and Time since onset of injury/illness/exacerbation are also affecting patient's functional outcome.      GOALS: Goals reviewed with patient? Yes   SHORT TERM GOALS: Target date: 08/21/2022   Patient will be independent and compliant with initial HEP.    Baseline: issued at eval  Goal status: met   2.  Patient will demonstrate at least 70 degrees of Lt cervical rotation AROM to improve ability to complete head turns while driving.  Baseline: see above  08/24/2022: 64 deg 08/31/22: see flow sheets Goal status: partially met   3.  Patient will demonstrate at least 50 degrees of cervical flexion AROM to improve tolerance to reading and work tasks.  Baseline: see above  Goal status: met   LONG TERM GOALS: Target date: 11/02/2022   Patient will demonstrate at least 160 degrees of Rt shoulder flexion AROM to improve ability to reach overhead at work.  Baseline: see above  09/21/2022: 150 deg Goal status: ONGOING   2.  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  Baseline: unable  09/21/2022: patient reports continued limitation with reaching overhead Goal status: ONGOING   3.  Patient will score at least 66% on FOTO to signify clinically meaningful improvement in functional abilities.  Baseline: see above  09/21/2022: 47% Goal status: ONGOING   4.  Patient will report pain as </=2/10 to reduce her current functional limitations.  Baseline: see above  09/21/2022: 7/10 Goal status: ONGOING     PLAN: PT FREQUENCY: 1-2x/week   PT DURATION: 6 weeks   PLANNED INTERVENTIONS: Therapeutic exercises, Therapeutic activity, Neuromuscular re-education, Patient/Family education, Self Care, Joint  mobilization, Dry Needling, Spinal manipulation, Spinal mobilization, Cryotherapy, Moist heat, Traction, Manual therapy, and Re-evaluation   PLAN FOR NEXT SESSION: Review and progress HEP prn; manual/TPDN to cervical region, cervical/shoulder mobility, strengthening as tolerated. Response to traction.   Letitia Libra, PT, DPT, ATC 10/04/22 10:14 AM

## 2022-10-06 ENCOUNTER — Ambulatory Visit: Payer: PRIVATE HEALTH INSURANCE

## 2022-10-06 DIAGNOSIS — M542 Cervicalgia: Secondary | ICD-10-CM

## 2022-10-06 DIAGNOSIS — R252 Cramp and spasm: Secondary | ICD-10-CM

## 2022-10-06 DIAGNOSIS — R293 Abnormal posture: Secondary | ICD-10-CM

## 2022-10-06 DIAGNOSIS — M6281 Muscle weakness (generalized): Secondary | ICD-10-CM

## 2022-10-06 NOTE — Therapy (Signed)
OUTPATIENT PHYSICAL THERAPY TREATMENT NOTE   Patient Name: Brenda Mcmahon MRN: [id number] DOB:[birth year], 32 y.o., female Today's Date: 10/06/2022  PCP: Blane Ohara, MD  REFERRING PROVIDER: Lanell Persons, MD       END OF SESSION:   PT End of Session - 10/06/22 0845     Visit Number 15    Number of Visits 18    Date for PT Re-Evaluation 11/02/22    Authorization Type Worker's comp 5/16- 6 new visits approved    Authorization - Visit Number 15    Authorization - Number of Visits 18    PT Start Time 0845    PT Stop Time 0945    PT Time Calculation (min) 60 min    Activity Tolerance Patient tolerated treatment well    Behavior During Therapy WFL for tasks assessed/performed                      Past Medical History:  Diagnosis Date   Palpitation    Past Surgical History:  Procedure Laterality Date   FOOT FRACTURE SURGERY Right 2015   also ankle fracture repaired.    WRIST FRACTURE SURGERY Left    32 yo.  fell through glass door.    Patient Active Problem List   Diagnosis Date Noted   Encounter for general adult medical examination without abnormal findings 09/18/2021   Need for DTaP vaccine 09/18/2021   Moderate recurrent major depression (HCC) 08/07/2021   GAD (generalized anxiety disorder) 08/07/2021   Encounter for other contraceptive management 08/07/2021   Palpitation 11/10/2020   Achilles bursitis of right lower extremity 03/14/2017   Depression, major, single episode, moderate (HCC) 03/14/2017    REFERRING DIAG: Q65.78IO (ICD-10-CM) - Contusion of other specified part of neck, subsequent encounter   THERAPY DIAG:  Cervicalgia  Cramp and spasm  Abnormal posture  Muscle weakness (generalized)  Rationale for Evaluation and Treatment Rehabilitation  PERTINENT HISTORY: Work-related injury on 06/30/22   PRECAUTIONS: none    SUBJECTIVE:                                                                                                                                                                                      SUBJECTIVE STATEMENT:  "It's ok. I think I slept on it wrong. Honestly I don't have any pain." She feels that the traction is really helpful.  PAIN:  Are you having pain? No   OBJECTIVE: (objective measures completed at initial evaluation unless otherwise dated) DIAGNOSTIC FINDINGS:  IMPRESSION: Negative cervical spine radiographs.   PATIENT SURVEYS:  FOTO 57% function to 66% predicted  09/21/2022: 47%   POSTURE:  Rounded shoulders  and forward head   PALPATION: Tautness and palpable tenderness Rt upper trap, cervical paraspinals, middle trap, levator scapulae, rhomboids             CERVICAL ROM:    Active ROM A/PROM (deg) eval 08/14/22 08/24/2022 08/31/22 10/06/22  Flexion 42 pn 62 "pulling"    75  Extension 50 "sore" 50 "pulling"   70  Right lateral flexion 35 pn   50   Left lateral flexion 30 pn   50   Right rotation 80 "pull"   80 85  Left rotation 60 pn  65 65 75   (Blank rows = not tested)   UPPER EXTREMITY ROM:   Active ROM Right eval Left eval Right 09/21/2022 Right 09/27/22  Shoulder flexion 145 "sore" Full  150 170 pain upper trap  Shoulder extension        Shoulder abduction        Shoulder adduction        Shoulder extension        Shoulder internal rotation        Shoulder external rotation        Elbow flexion        Elbow extension        Wrist flexion        Wrist extension        Wrist ulnar deviation        Wrist radial deviation        Wrist pronation        Wrist supination         (Blank rows = not tested)   UPPER EXTREMITY MMT:   MMT Right eval Left eval Right 08/24/2022  Shoulder flexion 4+ pn 5   Shoulder extension       Shoulder abduction 5 5   Shoulder adduction       Shoulder extension       Shoulder internal rotation       Shoulder external rotation       Middle trapezius 4+ pn 4+  4  Lower trapezius 4- pn 4-  4-  Elbow flexion       Elbow  extension       Wrist flexion       Wrist extension       Wrist ulnar deviation       Wrist radial deviation       Wrist pronation       Wrist supination       Grip strength        (Blank rows = not tested)   CERVICAL SPECIAL TESTS:  Cervical compression (-) Cervical distraction  (-)    FUNCTIONAL TESTS:  N/A   TODAY'S TREATMENT [facility] Adult PT Treatment:                                                DATE: 10/06/22 Therapeutic Exercise: Resisted rows 2 x 10 green band  Resisted shoulder extension red band 2 x 10  Resisted shoulder adduction green band 2 x 10 Prone row 2 x 10 @ 1 lb  Updated HEP   Modalities: Mechanical cervical traction, intermittent min 10 lbs, max 25 lbs 3 step progressive/regressive x 1 minute, 10 minutes    [facility] Adult PT Treatment:  DATE: 10/04/22 Therapeutic Exercise: Pec stretch on roller x 2 minutes Single arm serratus punch on roller 2 x 10  Prone single arm row 2 x 10  Manual Therapy: Manual cervical distraction 3 x 1 minute K-tape I-strip from Rt upper trap O to I, I strip from coracoid process to posterior shoulder lateral and superior 50 % tension STM Rt upper trap, levator scapulae, cervical paraspinals Passive Rt upper trap and levator scapulae stretch   Modalities: Mechanical cervical traction, intermittent min 10 lbs, max 25 lbs 3 step progressive/regressive x 1 minute, 10 minutes    [facility] Adult PT Treatment:                                                DATE: 09/28/22 Therapeutic Exercise:  Supine cervical retraction with extension x 10  Manual Therapy: Rt shoulder PROM into abduction and flexion Rt GHJ mobilizations inferior and posterior STM right upper trap, levator scapulae, cervical paraspinals, rhomboids, posterior rotator cuff, pectorals C-spine CPAs grade II-III Manual cervical distraction 3 x 1 minute K-tape I-strip from Rt upper trap O to I, I strip from coracoid process to  posterior shoulder lateral and superior 50 % tension  Trigger Point Dry Needling Treatment: Pre-treatment instruction: Patient instructed on dry needling rationale, procedures, and possible side effects including pain during treatment (achy,cramping feeling), bruising, drop of blood, lightheadedness, nausea, sweating. Patient Consent Given: Yes Education handout provided: Previously provided Muscles treated: Rt infraspinatus and C5 multifidi   Needle size and number: .30x80mm x 1 and .25x65mm x 1 Electrical stimulation performed: No Parameters: N/A Treatment response/outcome: Twitch response elicited and Palpable decrease in muscle tension Post-treatment instructions: Patient instructed to expect possible mild to moderate muscle soreness later today and/or tomorrow. Patient instructed in methods to reduce muscle soreness and to continue prescribed HEP. If patient was dry needled over the lung field, patient was instructed on signs and symptoms of pneumothorax and, however unlikely, to see immediate medical attention should they occur. Patient was also educated on signs and symptoms of infection and to seek medical attention should they occur. Patient verbalized understanding of these instructions and education.   Modalities: Mechanical cervical traction, intermittent min 10 lbs, max 25 lbs 3 step progressive/regressive x 1 minute, 10 minutes     [facility] Adult PT Treatment:                                                DATE: 09/27/22 Therapeutic Exercise: UBE level 3 x 2.5 min each fwd/bwd Prone W with therapist assist scapular depression 2 x 10  Manual Therapy: K-tape I-strip from Rt upper trap O to I, I strip from coracoid process to posterior shoulder lateral and superior 50 % tension  Cervicothoracic J-thrust manipulation  Skilled palpation and monitoring of muscle tension while performing TPDN STM right upper trap, levator scapulae, cervical paraspinals, rhomboids Pec stretching  Rt  scapulothoracic mobilization inferior, upward/downward rotation  Trigger Point Dry Needling Treatment: Pre-treatment instruction: Patient instructed on dry needling rationale, procedures, and possible side effects including pain during treatment (achy,cramping feeling), bruising, drop of blood, lightheadedness, nausea, sweating. Patient Consent Given: Yes Education handout provided: Previously provided Muscles treated: rt upper trap and Rt pec major/minor  Needle size and number: .30x18mm x 2 and .25x75mm x 1 Electrical stimulation performed: No Parameters: N/A Treatment response/outcome: Twitch response elicited and Palpable decrease in muscle tension Post-treatment instructions: Patient instructed to expect possible mild to moderate muscle soreness later today and/or tomorrow. Patient instructed in methods to reduce muscle soreness and to continue prescribed HEP. If patient was dry needled over the lung field, patient was instructed on signs and symptoms of pneumothorax and, however unlikely, to see immediate medical attention should they occur. Patient was also educated on signs and symptoms of infection and to seek medical attention should they occur. Patient verbalized understanding of these instructions and education.    [facility] Adult PT Treatment:                                                DATE: 09/21/22 Therapeutic Exercise: Sidelying thoracic rotation x 5 each Supine thoracic extension mobs over FR Supine chin tuck with peanut ball Supine thoracic extension over peanut ball with shoulder flexion and serratus punch Supine pec stretch on FR Manual Therapy: Skilled palpation and monitoring of muscle tension while performing TPDN STM right upper trap, levator scapulae, cervical paraspinals, rhomboids Gentle suboccipital release and manual traction Passive cervical retraction, upper trap, and levator stretch K-tape for shoulder retraction/depression Trigger Point Dry Needling  Treatment: Pre-treatment instruction: Patient instructed on dry needling rationale, procedures, and possible side effects including pain during treatment (achy,cramping feeling), bruising, drop of blood, lightheadedness, nausea, sweating. Patient Consent Given: Yes Education handout provided: Previously provided Muscles treated: Right upper trap, right rhomboid region, suboccipital, splenius cervicis, cervical multifidi  Needle size and number: .30x39mm x 6, .30x3mm x 3 Electrical stimulation performed: No Parameters: N/A Treatment response/outcome: Twitch response elicited and Palpable decrease in muscle tension Post-treatment instructions: Patient instructed to expect possible mild to moderate muscle soreness later today and/or tomorrow. Patient instructed in methods to reduce muscle soreness and to continue prescribed HEP. If patient was dry needled over the lung field, patient was instructed on signs and symptoms of pneumothorax and, however unlikely, to see immediate medical attention should they occur. Patient was also educated on signs and symptoms of infection and to seek medical attention should they occur. Patient verbalized understanding of these instructions and education.      PATIENT EDUCATION:  Education details: HEP update Person educated: Patient Education method: Explanation, demo, cues, handout Education comprehension: verbalized understanding, returned demo    HOME EXERCISE PROGRAM: Access Code: [email or web address]    ASSESSMENT: CLINICAL IMPRESSION: Patient tolerated therapy well with no adverse effects. She arrives without reports of pain reporting that mechanical traction continues to be helpful in reducing her pain. Focused on periscapular strengthening today with patient requiring frequent cues to reduce shoulder shrug. No reports of pain with strengthening exercises, but patient continually commented on how weak her RUE felt. She demonstrates normalized cervical  flexion,extension, and rotation AROM reporting pulling sensation, but no onset of pain. HEP was updated to include further strengthening.    OBJECTIVE IMPAIRMENTS: decreased activity tolerance, decreased knowledge of condition, decreased mobility, decreased ROM, decreased strength, increased fascial restrictions, impaired flexibility, impaired UE functional use, improper body mechanics, postural dysfunction, and pain.    ACTIVITY LIMITATIONS: carrying, lifting, and reach over head   PARTICIPATION LIMITATIONS: meal prep, cleaning, laundry, driving, shopping, occupation, and yard work   PERSONAL FACTORS: Age, Profession, and Time  since onset of injury/illness/exacerbation are also affecting patient's functional outcome.      GOALS: Goals reviewed with patient? Yes   SHORT TERM GOALS: Target date: 08/21/2022   Patient will be independent and compliant with initial HEP.    Baseline: issued at eval  Goal status: met   2.  Patient will demonstrate at least 70 degrees of Lt cervical rotation AROM to improve ability to complete head turns while driving.  Baseline: see above  08/24/2022: 64 deg 08/31/22: see flow sheets Goal status: met   3.  Patient will demonstrate at least 50 degrees of cervical flexion AROM to improve tolerance to reading and work tasks.  Baseline: see above  Goal status: met   LONG TERM GOALS: Target date: 11/02/2022   Patient will demonstrate at least 160 degrees of Rt shoulder flexion AROM to improve ability to reach overhead at work.  Baseline: see above  09/21/2022: 150 deg Goal status: ONGOING   2.  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  Baseline: unable  09/21/2022: patient reports continued limitation with reaching overhead Goal status: ONGOING   3.  Patient will score at least 66% on FOTO to signify clinically meaningful improvement in  functional abilities.  Baseline: see above  09/21/2022: 47% Goal status: ONGOING   4.  Patient will report pain as </=2/10 to reduce her current functional limitations.  Baseline: see above  09/21/2022: 7/10 Goal status: ONGOING     PLAN: PT FREQUENCY: 1-2x/week   PT DURATION: 6 weeks   PLANNED INTERVENTIONS: Therapeutic exercises, Therapeutic activity, Neuromuscular re-education, Patient/Family education, Self Care, Joint mobilization, Dry Needling, Spinal manipulation, Spinal mobilization, Cryotherapy, Moist heat, Traction, Manual therapy, and Re-evaluation   PLAN FOR NEXT SESSION: Review and progress HEP prn; manual/TPDN to cervical region, cervical/shoulder mobility, strengthening as tolerated. Response to traction.   Letitia Libra, PT, DPT, ATC 10/06/22 10:28 AM

## 2022-10-10 ENCOUNTER — Ambulatory Visit (INDEPENDENT_AMBULATORY_CARE_PROVIDER_SITE_OTHER): Payer: 59 | Admitting: Family Medicine

## 2022-10-10 ENCOUNTER — Encounter: Payer: Self-pay | Admitting: Physical Therapy

## 2022-10-10 ENCOUNTER — Other Ambulatory Visit (HOSPITAL_COMMUNITY): Payer: Self-pay

## 2022-10-10 ENCOUNTER — Encounter: Payer: Self-pay | Admitting: Family Medicine

## 2022-10-10 VITALS — BP 110/64 | HR 86 | Temp 97.3°F | Ht 62.0 in | Wt 131.0 lb

## 2022-10-10 DIAGNOSIS — F321 Major depressive disorder, single episode, moderate: Secondary | ICD-10-CM

## 2022-10-10 DIAGNOSIS — Z0000 Encounter for general adult medical examination without abnormal findings: Secondary | ICD-10-CM

## 2022-10-10 DIAGNOSIS — Z124 Encounter for screening for malignant neoplasm of cervix: Secondary | ICD-10-CM

## 2022-10-10 DIAGNOSIS — G4709 Other insomnia: Secondary | ICD-10-CM | POA: Diagnosis not present

## 2022-10-10 DIAGNOSIS — F3289 Other specified depressive episodes: Secondary | ICD-10-CM

## 2022-10-10 MED ORDER — DESVENLAFAXINE SUCCINATE ER 25 MG PO TB24
25.0000 mg | ORAL_TABLET | Freq: Every day | ORAL | 0 refills | Status: DC
  Filled 2022-10-10: qty 30, 30d supply, fill #0

## 2022-10-10 NOTE — Progress Notes (Signed)
Subjective:  Patient ID: Brenda Mcmahon, female    DOB: 10/16/1990  Age: 32 y.o. MRN: [id number]  Chief Complaint  Patient presents with   Annual Exam    HPI  Well Adult Physical: Patient here for a comprehensive physical exam. The patient reports no problems Do you take any herbs or supplements that were not prescribed by a doctor? no  Are you taking calcium supplements? no  Are you taking aspirin daily? no  Encounter for general adult medical examination without abnormal findings  Physical ("At Risk" items are starred): Patient's last physical exam was 1 year ago .  Patient is not afflicted from Stress Incontinence and Urge Incontinence  Patient wears a seat belts Patient has smoke detectors and has carbon monoxide detectors. Patient practices appropriate gun safety. Patient wears sunscreen with extended sun exposure. Dental Care: biannual cleanings, brushes and flosses daily. Ophthalmology/Optometry: Does not have annual visits.  Hearing loss: none Vision impairments: none  Menarche: 32 y.o Menstrual History: Reg LMP: 09/17/2022 Pregnancy history: 0 Safe at home: Yes Self breast exams: No  Has taken Lexapro for depression but stopped.     10/10/2022    2:29 PM 09/13/2021   11:19 AM 08/03/2021   10:14 AM 08/30/2020    4:32 PM 08/02/2020    4:05 PM  Depression screen PHQ 2/9  Decreased Interest 2 1 2 3 3   Down, Depressed, Hopeless 2 1 2 3 3   PHQ - 2 Score 4 2 4 6 6   Altered sleeping 3 3 1 3 3   Tired, decreased energy 3 1 2 3 3   Change in appetite 3 1 2 3 3   Feeling bad or failure about yourself  3 1 2 3 3   Trouble concentrating 3 1 2 3 3   Moving slowly or fidgety/restless 3 2 2 3 2   Suicidal thoughts 3 0 0 0 0  PHQ-9 Score 25 11 15 24 23   Difficult doing work/chores Very difficult Somewhat difficult Somewhat difficult Somewhat difficult          04/09/2020    1:29 PM 03/22/2021    8:46 AM 10/10/2022    2:29 PM  Fall Risk  Falls in the past year?   0   Was there an injury with Fall?   0  Fall Risk Category Calculator   0  (RETIRED) Patient Fall Risk Level Low fall risk Low fall risk   Patient at Risk for Falls Due to   No Fall Risks  Fall risk Follow up   Falls evaluation completed         Social Hx   Social History   Socioeconomic History   Marital status: Single    Spouse name: Not on file   Number of children: Not on file   Years of education: Not on file   Highest education level: Not on file  Occupational History   Occupation: Pharmacologist    Employer: [employer]  Tobacco Use   Smoking status: Never   Smokeless tobacco: Never  Substance and Sexual Activity   Alcohol use: No   Drug use: Never   Sexual activity: Not on file  Other Topics Concern   Not on file  Social History Narrative   Not on file   Social Determinants of Health   Financial Resource Strain: Low Risk  (09/13/2021)   Overall Financial Resource Strain (CARDIA)    Difficulty of Paying Living Expenses: Not very hard  Food Insecurity: No Food Insecurity (09/13/2021)  Hunger Vital Sign    Worried About Running Out of Food in the Last Year: Never true    Ran Out of Food in the Last Year: Never true  Transportation Needs: No Transportation Needs (09/13/2021)   PRAPARE - Administrator, Civil Service (Medical): No    Lack of Transportation (Non-Medical): No  Physical Activity: Inactive (10/10/2022)   Exercise Vital Sign    Days of Exercise per Week: 0 days    Minutes of Exercise per Session: 0 min  Stress: No Stress Concern Present (10/10/2022)   [employer] of Occupational Health - Occupational Stress Questionnaire    Feeling of Stress : Not at all  Social Connections: Moderately Isolated (10/10/2022)   Social Connection and Isolation Panel [NHANES]    Frequency of Communication with Friends and Family: Three times a week    Frequency of Social Gatherings with Friends and Family: Three times a week    Attends Religious  Services: More than 4 times per year    Active Member of Clubs or Organizations: No    Attends Banker Meetings: Never    Marital Status: Never married   Past Medical History:  Diagnosis Date   Palpitation    Past Surgical History:  Procedure Laterality Date   FOOT FRACTURE SURGERY Right 2015   also ankle fracture repaired.    WRIST FRACTURE SURGERY Left    32 yo.  fell through glass door.     Family History  Problem Relation Age of Onset   Seizures Mother    Hypotension Mother    Asthma Brother     Review of Systems  Constitutional:  Negative for chills, fatigue and fever.  HENT:  Positive for congestion. Negative for ear pain, rhinorrhea and sore throat.   Respiratory:  Negative for cough and shortness of breath.   Cardiovascular:  Negative for chest pain.  Gastrointestinal:  Negative for abdominal pain, constipation, diarrhea, nausea and vomiting.  Genitourinary:  Negative for dysuria and urgency.  Musculoskeletal:  Negative for back pain and myalgias.  Neurological:  Negative for dizziness, weakness, light-headedness and headaches.  Psychiatric/Behavioral:  Negative for dysphoric mood. The patient is not nervous/anxious.      Objective:  BP 110/64   Pulse 86   Temp (!) 97.3 F (36.3 C)   Ht 5\' 2"  (1.575 m)   Wt 131 lb (59.4 kg)   LMP 09/17/2022   SpO2 100%   BMI 23.96 kg/m      10/10/2022    2:27 PM 09/07/2022    3:39 PM 06/30/2022    5:32 PM  BP/Weight  Systolic BP 110 104 118  Diastolic BP 64 60 73  Wt. (Lbs) 131 132 125  BMI 23.96 kg/m2 24.14 kg/m2 22.86 kg/m2    Physical Exam Vitals reviewed.  Constitutional:      Appearance: Normal appearance. She is normal weight.  HENT:     Right Ear: Tympanic membrane, ear canal and external ear normal.     Left Ear: Tympanic membrane, ear canal and external ear normal.     Nose: Nose normal.     Right Turbinates: Swollen.     Left Turbinates: Swollen.     Comments: Turbinates-Bluish/Pink     Mouth/Throat:     Pharynx: No oropharyngeal exudate or posterior oropharyngeal erythema.  Eyes:     Conjunctiva/sclera: Conjunctivae normal.  Neck:     Vascular: No carotid bruit.  Cardiovascular:     Rate and Rhythm: Normal rate  and regular rhythm.     Pulses: Normal pulses.     Heart sounds: Normal heart sounds.  Pulmonary:     Effort: Pulmonary effort is normal. No respiratory distress.     Breath sounds: Normal breath sounds.  Abdominal:     General: Abdomen is flat. Bowel sounds are normal.     Palpations: Abdomen is soft. There is no mass.     Tenderness: There is no abdominal tenderness.  Musculoskeletal:     Cervical back: Normal range of motion.  Skin:    Findings: No lesion.  Neurological:     Mental Status: She is alert and oriented to person, place, and time.  Psychiatric:        Mood and Affect: Mood normal.        Behavior: Behavior normal.     Lab Results  Component Value Date   WBC 6.7 10/10/2022   HGB 12.3 10/10/2022   HCT 37.8 10/10/2022   PLT 197 10/10/2022   GLUCOSE 84 09/13/2021   CHOL 160 10/10/2022   TRIG 91 10/10/2022   HDL 63 10/10/2022   LDLCALC 80 10/10/2022   ALT 14 09/13/2021   AST 20 09/13/2021   NA 137 09/13/2021   K 4.5 09/13/2021   CL 102 09/13/2021   CREATININE 0.88 09/13/2021   BUN 7 09/13/2021   CO2 22 09/13/2021   TSH 1.530 10/10/2022      Assessment & Plan:  Routine medical exam Assessment & Plan: Things to do to keep yourself healthy  - Exercise at least 30-45 minutes a day, 3-4 days a week.  - Eat a low-fat diet with lots of fruits and vegetables, up to 7-9 servings per day.  - Seatbelts can save your life. Wear them always.  - Smoke detectors on every level of your home, check batteries every year.  - Eye Doctor - have an eye exam every 1-2 years  - Safe sex - if you may be exposed to STDs, use a condom.  - Alcohol -  If you drink, do it moderately, less than 2 drinks per day.  - Health Care Power of Attorney.  Choose someone to speak for you if you are not able.  - Depression is common in our stressful world.If you're feeling down or losing interest in things you normally enjoy, please come in for a visit.  - Violence - If anyone is threatening or hurting you, please call immediately.   Orders: -     CBC with Differential/Platelet -     Lipid panel -     TSH  Other insomnia Assessment & Plan: Start Ambien.   Pap smear for cervical cancer screening -     IGP, Aptima HPV, rfx 16/18,45  Depression, major, single episode, moderate (HCC) Assessment & Plan: Started on Pristiq and Ambien   Orders: -     Desvenlafaxine Succinate ER; Take 1 tablet (25 mg total) by mouth daily.  Dispense: 30 tablet; Refill: 0     Body mass index is 23.96 kg/m.   These are the goals we discussed:  Goals   None      This is a list of the screening recommended for you and due dates:  Health Maintenance  Topic Date Due   HIV Screening  Never done   Hepatitis C Screening  Never done   COVID-19 Vaccine (3 - 2023-24 season) 12/16/2021   Flu Shot  11/16/2022   Pap Smear  10/10/2027   DTaP/Tdap/Td vaccine (2 -  Td or Tdap) 09/14/2031   HPV Vaccine  Aged Out     Meds ordered this encounter  Medications   desvenlafaxine (PRISTIQ) 25 MG 24 hr tablet    Sig: Take 1 tablet (25 mg total) by mouth daily.    Dispense:  30 tablet    Refill:  0    Follow-up: Return in about 4 weeks (around 11/07/2022) for Depression/Insomnia.  An After Visit Summary was printed and given to the patient.  Clayborn Bigness I Leal-Borjas,acting as a scribe for Blane Ohara, MD.,have documented all relevant documentation on the behalf of Blane Ohara, MD,as directed by  Blane Ohara, MD while in the presence of Blane Ohara, MD.   I attest that I have reviewed this visit and agree with the plan scribed by my staff.   Blane Ohara, MD [facility] [phone]

## 2022-10-10 NOTE — Patient Instructions (Addendum)
Schedule appt with Counselor [facility] and pristiq

## 2022-10-10 NOTE — Assessment & Plan Note (Addendum)
Started on Pristiq and Ambien

## 2022-10-11 ENCOUNTER — Encounter: Payer: Self-pay | Admitting: Family Medicine

## 2022-10-11 ENCOUNTER — Encounter: Payer: Self-pay | Admitting: Physical Therapy

## 2022-10-11 ENCOUNTER — Other Ambulatory Visit: Payer: Self-pay

## 2022-10-11 ENCOUNTER — Other Ambulatory Visit (HOSPITAL_COMMUNITY): Payer: Self-pay

## 2022-10-11 ENCOUNTER — Other Ambulatory Visit: Payer: Self-pay | Admitting: Family Medicine

## 2022-10-11 ENCOUNTER — Ambulatory Visit: Payer: PRIVATE HEALTH INSURANCE | Admitting: Physical Therapy

## 2022-10-11 ENCOUNTER — Telehealth: Payer: Self-pay

## 2022-10-11 DIAGNOSIS — M6281 Muscle weakness (generalized): Secondary | ICD-10-CM

## 2022-10-11 DIAGNOSIS — R293 Abnormal posture: Secondary | ICD-10-CM

## 2022-10-11 DIAGNOSIS — R252 Cramp and spasm: Secondary | ICD-10-CM

## 2022-10-11 DIAGNOSIS — M542 Cervicalgia: Secondary | ICD-10-CM

## 2022-10-11 LAB — CBC WITH DIFFERENTIAL/PLATELET
Basophils Absolute: 0 10*3/uL (ref 0.0–0.2)
Basos: 1 %
EOS (ABSOLUTE): 0.2 10*3/uL (ref 0.0–0.4)
Eos: 3 %
Hematocrit: 37.8 % (ref 34.0–46.6)
Hemoglobin: 12.3 g/dL (ref 11.1–15.9)
Immature Grans (Abs): 0 10*3/uL (ref 0.0–0.1)
Immature Granulocytes: 0 %
Lymphocytes Absolute: 2.1 10*3/uL (ref 0.7–3.1)
Lymphs: 31 %
MCH: 28.3 pg (ref 26.6–33.0)
MCHC: 32.5 g/dL (ref 31.5–35.7)
MCV: 87 fL (ref 79–97)
Monocytes Absolute: 0.4 10*3/uL (ref 0.1–0.9)
Monocytes: 6 %
Neutrophils Absolute: 3.9 10*3/uL (ref 1.4–7.0)
Neutrophils: 59 %
Platelets: 197 10*3/uL (ref 150–450)
RBC: 4.34 x10E6/uL (ref 3.77–5.28)
RDW: 11.3 % — ABNORMAL LOW (ref 11.7–15.4)
WBC: 6.7 10*3/uL (ref 3.4–10.8)

## 2022-10-11 LAB — LIPID PANEL
Chol/HDL Ratio: 2.5 ratio (ref 0.0–4.4)
Cholesterol, Total: 160 mg/dL (ref 100–199)
HDL: 63 mg/dL (ref >39)
LDL Chol Calc (NIH): 80 mg/dL (ref 0–99)
Triglycerides: 91 mg/dL (ref 0–149)
VLDL Cholesterol Cal: 17 mg/dL (ref 5–40)

## 2022-10-11 LAB — TSH: TSH: 1.53 u[IU]/mL (ref 0.450–4.500)

## 2022-10-11 MED ORDER — ZOLPIDEM TARTRATE 5 MG PO TABS
5.0000 mg | ORAL_TABLET | Freq: Every evening | ORAL | 2 refills | Status: DC | PRN
  Filled 2022-10-11: qty 30, 30d supply, fill #0

## 2022-10-11 NOTE — Telephone Encounter (Signed)
Patient called and stated that she did not receive the new script for Ambien and that it was not sent to [facility] pharmacy. She did state that she did receive the Pristiq but not the [place]. Can you please send?

## 2022-10-11 NOTE — Telephone Encounter (Signed)
Sent ambien. Dr. Sedalia Muta

## 2022-10-11 NOTE — Therapy (Signed)
OUTPATIENT PHYSICAL THERAPY TREATMENT NOTE   Patient Name: Brenda Mcmahon MRN: [id number] DOB:06-03-1990, 32 y.o., female Today's Date: 10/11/2022  PCP: Blane Ohara, MD  REFERRING PROVIDER: Lanell Persons, MD       END OF SESSION:   PT End of Session - 10/11/22 1553     Visit Number 16    Number of Visits 18    Date for PT Re-Evaluation 11/02/22    Authorization Type Worker's comp 5/16- 6 new visits approved    Authorization - Visit Number 16    Authorization - Number of Visits 18    PT Start Time 1610    PT Stop Time 1655    PT Time Calculation (min) 45 min    Activity Tolerance Patient tolerated treatment well    Behavior During Therapy WFL for tasks assessed/performed                       Past Medical History:  Diagnosis Date   Palpitation    Past Surgical History:  Procedure Laterality Date   FOOT FRACTURE SURGERY Right 2015   also ankle fracture repaired.    WRIST FRACTURE SURGERY Left    32 yo.  fell through glass door.    Patient Active Problem List   Diagnosis Date Noted   Encounter for general adult medical examination without abnormal findings 09/18/2021   Need for DTaP vaccine 09/18/2021   Moderate recurrent major depression (HCC) 08/07/2021   GAD (generalized anxiety disorder) 08/07/2021   Encounter for other contraceptive management 08/07/2021   Palpitation 11/10/2020   Achilles bursitis of right lower extremity 03/14/2017   Depression, major, single episode, moderate (HCC) 03/14/2017    REFERRING DIAG: W09.81XB (ICD-10-CM) - Contusion of other specified part of neck, subsequent encounter   THERAPY DIAG:  Cervicalgia  Cramp and spasm  Abnormal posture  Muscle weakness (generalized)  Rationale for Evaluation and Treatment Rehabilitation  PERTINENT HISTORY: Work-related injury on 06/30/22   PRECAUTIONS: none    SUBJECTIVE:                                                                                                                                                                                      SUBJECTIVE STATEMENT:  Patient reports she is sore from doing the rows and using muscles she hasn't used in a while.   PAIN:  Are you having pain? No   OBJECTIVE: (objective measures completed at initial evaluation unless otherwise dated) DIAGNOSTIC FINDINGS:  IMPRESSION: Negative cervical spine radiographs.   PATIENT SURVEYS:  FOTO 57% function to 66% predicted  09/21/2022: 47%   POSTURE:  Rounded shoulders and forward head  PALPATION: Tautness and palpable tenderness Rt upper trap, cervical paraspinals, middle trap, levator scapulae, rhomboids             CERVICAL ROM:    Active ROM A/PROM (deg) eval 08/14/22 08/24/2022 08/31/22 10/06/22  Flexion 42 pn 62 "pulling"    75  Extension 50 "sore" 50 "pulling"   70  Right lateral flexion 35 pn   50   Left lateral flexion 30 pn   50   Right rotation 80 "pull"   80 85  Left rotation 60 pn  65 65 75   (Blank rows = not tested)   UPPER EXTREMITY ROM:   Active ROM Right eval Left eval Right 09/21/2022 Right 09/27/22  Shoulder flexion 145 "sore" Full  150 170 pain upper trap  Shoulder extension        Shoulder abduction        Shoulder adduction        Shoulder extension        Shoulder internal rotation        Shoulder external rotation        Elbow flexion        Elbow extension        Wrist flexion        Wrist extension        Wrist ulnar deviation        Wrist radial deviation        Wrist pronation        Wrist supination         (Blank rows = not tested)   UPPER EXTREMITY MMT:   MMT Right eval Left eval Right 08/24/2022  Shoulder flexion 4+ pn 5   Shoulder extension       Shoulder abduction 5 5   Shoulder adduction       Shoulder extension       Shoulder internal rotation       Shoulder external rotation       Middle trapezius 4+ pn 4+  4  Lower trapezius 4- pn 4-  4-  Elbow flexion       Elbow extension       Wrist flexion        Wrist extension       Wrist ulnar deviation       Wrist radial deviation       Wrist pronation       Wrist supination       Grip strength        (Blank rows = not tested)   CERVICAL SPECIAL TESTS:  Cervical compression (-) Cervical distraction  (-)    FUNCTIONAL TESTS:  N/A   TODAY'S TREATMENT [facility] Adult PT Treatment:                                                DATE: 10/11/22 Therapeutic Exercise: Prone I, T, W, Y 10 x 5 sec each Quadruped serratus press x 10 Manual Therapy: Skilled palpation and monitoring of muscle tension while performing TPDN Gentle suboccipital release and manual traction Passive cervical retraction, upper trap, and levator stretch K-tape for shoulder retraction/depression and upper trap  Trigger Point Dry Needling Treatment: Pre-treatment instruction: Patient instructed on dry needling rationale, procedures, and possible side effects including pain during treatment (achy,cramping feeling), bruising, drop of blood, lightheadedness, nausea, sweating. Patient Consent Given:  Yes Education handout provided: Previously provided Muscles treated: Bilateral upper trap, right rhomboid region, suboccipital, splenius cervicis, cervical multifidi, right lat Needle size and number: .30x75mm x 8 Electrical stimulation performed: No Parameters: N/A Treatment response/outcome: Twitch response elicited and Palpable decrease in muscle tension Post-treatment instructions: Patient instructed to expect possible mild to moderate muscle soreness later today and/or tomorrow. Patient instructed in methods to reduce muscle soreness and to continue prescribed HEP. If patient was dry needled over the lung field, patient was instructed on signs and symptoms of pneumothorax and, however unlikely, to see immediate medical attention should they occur. Patient was also educated on signs and symptoms of infection and to seek medical attention should they occur. Patient verbalized  understanding of these instructions and education.   [facility] Adult PT Treatment:                                                DATE: 10/06/22 Therapeutic Exercise: Resisted rows 2 x 10 green band  Resisted shoulder extension red band 2 x 10  Resisted shoulder adduction green band 2 x 10 Prone row 2 x 10 @ 1 lb  Updated HEP  Modalities: Mechanical cervical traction, intermittent min 10 lbs, max 25 lbs 3 step progressive/regressive x 1 minute, 10 minutes   [facility] Adult PT Treatment:                                                DATE: 10/04/22 Therapeutic Exercise: Pec stretch on roller x 2 minutes Single arm serratus punch on roller 2 x 10  Prone single arm row 2 x 10  Manual Therapy: Manual cervical distraction 3 x 1 minute K-tape I-strip from Rt upper trap O to I, I strip from coracoid process to posterior shoulder lateral and superior 50 % tension STM Rt upper trap, levator scapulae, cervical paraspinals Passive Rt upper trap and levator scapulae stretch  Modalities: Mechanical cervical traction, intermittent min 10 lbs, max 25 lbs 3 step progressive/regressive x 1 minute, 10 minutes   [facility] Adult PT Treatment:                                                DATE: 09/28/22 Therapeutic Exercise:  Supine cervical retraction with extension x 10  Manual Therapy: Rt shoulder PROM into abduction and flexion Rt GHJ mobilizations inferior and posterior STM right upper trap, levator scapulae, cervical paraspinals, rhomboids, posterior rotator cuff, pectorals C-spine CPAs grade II-III Manual cervical distraction 3 x 1 minute K-tape I-strip from Rt upper trap O to I, I strip from coracoid process to posterior shoulder lateral and superior 50 % tension  Trigger Point Dry Needling Treatment: Pre-treatment instruction: Patient instructed on dry needling rationale, procedures, and possible side effects including pain during treatment (achy,cramping feeling), bruising, drop of blood, lightheadedness,  nausea, sweating. Patient Consent Given: Yes Education handout provided: Previously provided Muscles treated: Rt infraspinatus and C5 multifidi   Needle size and number: .30x58mm x 1 and .25x59mm x 1 Electrical stimulation performed: No Parameters: N/A Treatment response/outcome: Twitch response elicited and Palpable decrease in muscle tension  Post-treatment instructions: Patient instructed to expect possible mild to moderate muscle soreness later today and/or tomorrow. Patient instructed in methods to reduce muscle soreness and to continue prescribed HEP. If patient was dry needled over the lung field, patient was instructed on signs and symptoms of pneumothorax and, however unlikely, to see immediate medical attention should they occur. Patient was also educated on signs and symptoms of infection and to seek medical attention should they occur. Patient verbalized understanding of these instructions and education. Modalities: Mechanical cervical traction, intermittent min 10 lbs, max 25 lbs 3 step progressive/regressive x 1 minute, 10 minutes   [facility] Adult PT Treatment:                                                DATE: 09/27/22 Therapeutic Exercise: UBE level 3 x 2.5 min each fwd/bwd Prone W with therapist assist scapular depression 2 x 10  Manual Therapy: K-tape I-strip from Rt upper trap O to I, I strip from coracoid process to posterior shoulder lateral and superior 50 % tension  Cervicothoracic J-thrust manipulation  Skilled palpation and monitoring of muscle tension while performing TPDN STM right upper trap, levator scapulae, cervical paraspinals, rhomboids Pec stretching  Rt scapulothoracic mobilization inferior, upward/downward rotation  Trigger Point Dry Needling Treatment: Pre-treatment instruction: Patient instructed on dry needling rationale, procedures, and possible side effects including pain during treatment (achy,cramping feeling), bruising, drop of blood, lightheadedness,  nausea, sweating. Patient Consent Given: Yes Education handout provided: Previously provided Muscles treated: rt upper trap and Rt pec major/minor   Needle size and number: .30x38mm x 2 and .25x53mm x 1 Electrical stimulation performed: No Parameters: N/A Treatment response/outcome: Twitch response elicited and Palpable decrease in muscle tension Post-treatment instructions: Patient instructed to expect possible mild to moderate muscle soreness later today and/or tomorrow. Patient instructed in methods to reduce muscle soreness and to continue prescribed HEP. If patient was dry needled over the lung field, patient was instructed on signs and symptoms of pneumothorax and, however unlikely, to see immediate medical attention should they occur. Patient was also educated on signs and symptoms of infection and to seek medical attention should they occur. Patient verbalized understanding of these instructions and education.  PATIENT EDUCATION:  Education details: HEP update Person educated: Patient Education method: Explanation, demo, cues, handout Education comprehension: verbalized understanding, returned demo    HOME EXERCISE PROGRAM: Access Code: [email or web address]    ASSESSMENT: CLINICAL IMPRESSION: Patient tolerated therapy well with no adverse effects. Therapy continued with TPDN with good therapeutic benefit and therapy continues to focus on progressing postural and scapular control. She reports weakness of the right shoulder and scapular region, and difficulty coordinating serratus activation and eccentric control. No changes to HEP this visit. Patient would benefit from continued skilled PT to progress her mobility and strength in order to reduce pain and maximize functional ability.   OBJECTIVE IMPAIRMENTS: decreased activity tolerance, decreased knowledge of condition, decreased mobility, decreased ROM, decreased strength, increased fascial restrictions, impaired flexibility, impaired UE  functional use, improper body mechanics, postural dysfunction, and pain.    ACTIVITY LIMITATIONS: carrying, lifting, and reach over head   PARTICIPATION LIMITATIONS: meal prep, cleaning, laundry, driving, shopping, occupation, and yard work   PERSONAL FACTORS: Age, Profession, and Time since onset of injury/illness/exacerbation are also affecting patient's functional outcome.      GOALS: Goals reviewed with patient?  Yes   SHORT TERM GOALS: Target date: 08/21/2022   Patient will be independent and compliant with initial HEP.    Baseline: issued at eval  Goal status: met   2.  Patient will demonstrate at least 70 degrees of Lt cervical rotation AROM to improve ability to complete head turns while driving.  Baseline: see above  08/24/2022: 64 deg 08/31/22: see flow sheets Goal status: met   3.  Patient will demonstrate at least 50 degrees of cervical flexion AROM to improve tolerance to reading and work tasks.  Baseline: see above  Goal status: met   LONG TERM GOALS: Target date: 11/02/2022   Patient will demonstrate at least 160 degrees of Rt shoulder flexion AROM to improve ability to reach overhead at work.  Baseline: see above  09/21/2022: 150 deg Goal status: ONGOING   2.  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  Baseline: unable  09/21/2022: patient reports continued limitation with reaching overhead Goal status: ONGOING   3.  Patient will score at least 66% on FOTO to signify clinically meaningful improvement in functional abilities.  Baseline: see above  09/21/2022: 47% Goal status: ONGOING   4.  Patient will report pain as </=2/10 to reduce her current functional limitations.  Baseline: see above  09/21/2022: 7/10 Goal status: ONGOING     PLAN: PT FREQUENCY: 1-2x/week   PT DURATION: 6 weeks   PLANNED INTERVENTIONS: Therapeutic exercises, Therapeutic  activity, Neuromuscular re-education, Patient/Family education, Self Care, Joint mobilization, Dry Needling, Spinal manipulation, Spinal mobilization, Cryotherapy, Moist heat, Traction, Manual therapy, and Re-evaluation   PLAN FOR NEXT SESSION: Review and progress HEP prn; manual/TPDN to cervical region, cervical/shoulder mobility, strengthening as tolerated. Response to traction.    Rosana Hoes, PT, DPT, LAT, ATC 10/11/22  5:01 PM Phone: [phone] Fax: [phone]

## 2022-10-12 ENCOUNTER — Ambulatory Visit: Payer: PRIVATE HEALTH INSURANCE | Attending: Family Medicine | Admitting: Physical Therapy

## 2022-10-12 ENCOUNTER — Encounter: Payer: Self-pay | Admitting: Physical Therapy

## 2022-10-12 ENCOUNTER — Other Ambulatory Visit: Payer: Self-pay

## 2022-10-12 ENCOUNTER — Other Ambulatory Visit (HOSPITAL_COMMUNITY): Payer: Self-pay

## 2022-10-12 ENCOUNTER — Other Ambulatory Visit: Payer: Self-pay | Admitting: Family Medicine

## 2022-10-12 DIAGNOSIS — J301 Allergic rhinitis due to pollen: Secondary | ICD-10-CM

## 2022-10-12 DIAGNOSIS — R293 Abnormal posture: Secondary | ICD-10-CM | POA: Diagnosis present

## 2022-10-12 DIAGNOSIS — M542 Cervicalgia: Secondary | ICD-10-CM

## 2022-10-12 DIAGNOSIS — M6281 Muscle weakness (generalized): Secondary | ICD-10-CM

## 2022-10-12 DIAGNOSIS — R252 Cramp and spasm: Secondary | ICD-10-CM

## 2022-10-12 LAB — IGP, APTIMA HPV, RFX 16/18,45
HPV Aptima: NEGATIVE
PAP Smear Comment: 0

## 2022-10-12 MED ORDER — FLUTICASONE PROPIONATE 50 MCG/ACT NA SUSP
2.0000 | Freq: Every day | NASAL | 6 refills | Status: DC
  Filled 2022-10-12: qty 16, 30d supply, fill #0
  Filled 2023-07-13: qty 16, 30d supply, fill #1

## 2022-10-12 MED ORDER — FLUCONAZOLE 150 MG PO TABS
150.0000 mg | ORAL_TABLET | Freq: Once | ORAL | 0 refills | Status: AC
  Filled 2022-10-12: qty 1, 1d supply, fill #0

## 2022-10-12 NOTE — Therapy (Signed)
OUTPATIENT PHYSICAL THERAPY TREATMENT NOTE   Patient Name: Brenda Mcmahon MRN: [id number] DOB:05/26/1990, 32 y.o., female Today's Date: 10/12/2022  PCP: Blane Ohara, MD  REFERRING PROVIDER: Lanell Persons, MD       END OF SESSION:   PT End of Session - 10/12/22 1650     Visit Number 17    Number of Visits 18    Date for PT Re-Evaluation 11/02/22    Authorization Type Worker's comp 5/16- 6 new visits approved    Authorization - Visit Number 17    Authorization - Number of Visits 18    PT Start Time 1530    PT Stop Time 1615    PT Time Calculation (min) 45 min    Activity Tolerance Patient tolerated treatment well    Behavior During Therapy WFL for tasks assessed/performed                        Past Medical History:  Diagnosis Date   Palpitation    Past Surgical History:  Procedure Laterality Date   FOOT FRACTURE SURGERY Right 2015   also ankle fracture repaired.    WRIST FRACTURE SURGERY Left    32 yo.  fell through glass door.    Patient Active Problem List   Diagnosis Date Noted   Encounter for general adult medical examination without abnormal findings 09/18/2021   Need for DTaP vaccine 09/18/2021   Moderate recurrent major depression (HCC) 08/07/2021   GAD (generalized anxiety disorder) 08/07/2021   Encounter for other contraceptive management 08/07/2021   Palpitation 11/10/2020   Achilles bursitis of right lower extremity 03/14/2017   Depression, major, single episode, moderate (HCC) 03/14/2017    REFERRING DIAG: U72.53GU (ICD-10-CM) - Contusion of other specified part of neck, subsequent encounter   THERAPY DIAG:  Cervicalgia  Cramp and spasm  Abnormal posture  Muscle weakness (generalized)  Rationale for Evaluation and Treatment Rehabilitation  PERTINENT HISTORY: Work-related injury on 06/30/22   PRECAUTIONS: none    SUBJECTIVE:                                                                                                                                                                                      SUBJECTIVE STATEMENT:  Patient reports she is sore from doing the rows and using muscles she hasn't used in a while.   PAIN:  Are you having pain? No   OBJECTIVE: (objective measures completed at initial evaluation unless otherwise dated) DIAGNOSTIC FINDINGS:  IMPRESSION: Negative cervical spine radiographs.   PATIENT SURVEYS:  FOTO 57% function to 66% predicted  09/21/2022: 47%   POSTURE:  Rounded shoulders and forward  head   PALPATION: Tautness and palpable tenderness Rt upper trap, cervical paraspinals, middle trap, levator scapulae, rhomboids             CERVICAL ROM:    Active ROM A/PROM (deg) eval 08/14/22 08/24/2022 08/31/22 10/06/22  Flexion 42 pn 62 "pulling"    75  Extension 50 "sore" 50 "pulling"   70  Right lateral flexion 35 pn   50   Left lateral flexion 30 pn   50   Right rotation 80 "pull"   80 85  Left rotation 60 pn  65 65 75   (Blank rows = not tested)   UPPER EXTREMITY ROM:   Active ROM Right eval Left eval Right 09/21/2022 Right 09/27/22 Rt / Lt 10/12/22  Shoulder flexion 145 "sore" Full  150 170 pain upper trap   Shoulder extension         Shoulder abduction         Shoulder adduction         Shoulder extension         Shoulder internal rotation       T10 / T6  Shoulder external rotation         Elbow flexion         Elbow extension         Wrist flexion         Wrist extension         Wrist ulnar deviation         Wrist radial deviation         Wrist pronation         Wrist supination          (Blank rows = not tested)   UPPER EXTREMITY MMT:   MMT Right eval Left eval Right 08/24/2022  Shoulder flexion 4+ pn 5   Shoulder extension       Shoulder abduction 5 5   Shoulder adduction       Shoulder extension       Shoulder internal rotation       Shoulder external rotation       Middle trapezius 4+ pn 4+  4  Lower trapezius 4- pn 4-  4-  Elbow flexion        Elbow extension       Wrist flexion       Wrist extension       Wrist ulnar deviation       Wrist radial deviation       Wrist pronation       Wrist supination       Grip strength        (Blank rows = not tested)   CERVICAL SPECIAL TESTS:  Cervical compression (-) Cervical distraction  (-)    FUNCTIONAL TESTS:  N/A   TODAY'S TREATMENT [facility] Adult PT Treatment:                                                DATE: 10/12/22 Therapeutic Exercise: Sidelying thoracic rotation x 10 each Quadruped thoracic rotation x 10 each Supine pec stretch on FR Supine serratus punch on FR x 10 Quadruped serratus press x 10 Prone scapular angels x 6 Bear crawl position to pike x 10 Turkish sit-up with 5# KB bottom up x 5 each Sleeper stretch 3 x 30 sec Manual Therapy:  Prone thoracic extension thrust manipulation x 3 levels   [facility] Adult PT Treatment:                                                DATE: 10/11/22 Therapeutic Exercise: Prone I, T, W, Y 10 x 5 sec each Quadruped serratus press x 10 Manual Therapy: Skilled palpation and monitoring of muscle tension while performing TPDN Gentle suboccipital release and manual traction Passive cervical retraction, upper trap, and levator stretch K-tape for shoulder retraction/depression and upper trap  Trigger Point Dry Needling Treatment: Pre-treatment instruction: Patient instructed on dry needling rationale, procedures, and possible side effects including pain during treatment (achy,cramping feeling), bruising, drop of blood, lightheadedness, nausea, sweating. Patient Consent Given: Yes Education handout provided: Previously provided Muscles treated: Bilateral upper trap, right rhomboid region, suboccipital, splenius cervicis, cervical multifidi, right lat Needle size and number: .30x37mm x 8 Electrical stimulation performed: No Parameters: N/A Treatment response/outcome: Twitch response elicited and Palpable decrease in muscle  tension Post-treatment instructions: Patient instructed to expect possible mild to moderate muscle soreness later today and/or tomorrow. Patient instructed in methods to reduce muscle soreness and to continue prescribed HEP. If patient was dry needled over the lung field, patient was instructed on signs and symptoms of pneumothorax and, however unlikely, to see immediate medical attention should they occur. Patient was also educated on signs and symptoms of infection and to seek medical attention should they occur. Patient verbalized understanding of these instructions and education.  [facility] Adult PT Treatment:                                                DATE: 10/06/22 Therapeutic Exercise: Resisted rows 2 x 10 green band  Resisted shoulder extension red band 2 x 10  Resisted shoulder adduction green band 2 x 10 Prone row 2 x 10 @ 1 lb  Updated HEP  Modalities: Mechanical cervical traction, intermittent min 10 lbs, max 25 lbs 3 step progressive/regressive x 1 minute, 10 minutes   [facility] Adult PT Treatment:                                                DATE: 10/04/22 Therapeutic Exercise: Pec stretch on roller x 2 minutes Single arm serratus punch on roller 2 x 10  Prone single arm row 2 x 10  Manual Therapy: Manual cervical distraction 3 x 1 minute K-tape I-strip from Rt upper trap O to I, I strip from coracoid process to posterior shoulder lateral and superior 50 % tension STM Rt upper trap, levator scapulae, cervical paraspinals Passive Rt upper trap and levator scapulae stretch  Modalities: Mechanical cervical traction, intermittent min 10 lbs, max 25 lbs 3 step progressive/regressive x 1 minute, 10 minutes   [facility] Adult PT Treatment:                                                DATE: 09/28/22 Therapeutic Exercise:  Supine cervical  retraction with extension x 10  Manual Therapy: Rt shoulder PROM into abduction and flexion Rt GHJ mobilizations inferior and posterior STM right upper  trap, levator scapulae, cervical paraspinals, rhomboids, posterior rotator cuff, pectorals C-spine CPAs grade II-III Manual cervical distraction 3 x 1 minute K-tape I-strip from Rt upper trap O to I, I strip from coracoid process to posterior shoulder lateral and superior 50 % tension  Trigger Point Dry Needling Treatment: Pre-treatment instruction: Patient instructed on dry needling rationale, procedures, and possible side effects including pain during treatment (achy,cramping feeling), bruising, drop of blood, lightheadedness, nausea, sweating. Patient Consent Given: Yes Education handout provided: Previously provided Muscles treated: Rt infraspinatus and C5 multifidi   Needle size and number: .30x92mm x 1 and .25x26mm x 1 Electrical stimulation performed: No Parameters: N/A Treatment response/outcome: Twitch response elicited and Palpable decrease in muscle tension Post-treatment instructions: Patient instructed to expect possible mild to moderate muscle soreness later today and/or tomorrow. Patient instructed in methods to reduce muscle soreness and to continue prescribed HEP. If patient was dry needled over the lung field, patient was instructed on signs and symptoms of pneumothorax and, however unlikely, to see immediate medical attention should they occur. Patient was also educated on signs and symptoms of infection and to seek medical attention should they occur. Patient verbalized understanding of these instructions and education. Modalities: Mechanical cervical traction, intermittent min 10 lbs, max 25 lbs 3 step progressive/regressive x 1 minute, 10 minutes   [facility] Adult PT Treatment:                                                DATE: 09/27/22 Therapeutic Exercise: UBE level 3 x 2.5 min each fwd/bwd Prone W with therapist assist scapular depression 2 x 10  Manual Therapy: K-tape I-strip from Rt upper trap O to I, I strip from coracoid process to posterior shoulder lateral and  superior 50 % tension  Cervicothoracic J-thrust manipulation  Skilled palpation and monitoring of muscle tension while performing TPDN STM right upper trap, levator scapulae, cervical paraspinals, rhomboids Pec stretching  Rt scapulothoracic mobilization inferior, upward/downward rotation  Trigger Point Dry Needling Treatment: Pre-treatment instruction: Patient instructed on dry needling rationale, procedures, and possible side effects including pain during treatment (achy,cramping feeling), bruising, drop of blood, lightheadedness, nausea, sweating. Patient Consent Given: Yes Education handout provided: Previously provided Muscles treated: rt upper trap and Rt pec major/minor   Needle size and number: .30x22mm x 2 and .25x10mm x 1 Electrical stimulation performed: No Parameters: N/A Treatment response/outcome: Twitch response elicited and Palpable decrease in muscle tension Post-treatment instructions: Patient instructed to expect possible mild to moderate muscle soreness later today and/or tomorrow. Patient instructed in methods to reduce muscle soreness and to continue prescribed HEP. If patient was dry needled over the lung field, patient was instructed on signs and symptoms of pneumothorax and, however unlikely, to see immediate medical attention should they occur. Patient was also educated on signs and symptoms of infection and to seek medical attention should they occur. Patient verbalized understanding of these instructions and education.  PATIENT EDUCATION:  Education details: HEP update Person educated: Patient Education method: Explanation, demo, cues, Handout Education comprehension: verbalized understanding, returned demo    HOME EXERCISE PROGRAM: Access Code: [email or web address]    ASSESSMENT: CLINICAL IMPRESSION: Patient tolerated therapy well with no adverse effects. Therapy continues to focus on  progressing postural control and spinal mobility. She reports improvement in thoracic  mobility following thrust manipulation. She does continue to exhibit weakness and shaking when performing periscapular strengthening and difficulty coordinating serratus movement. Incorporated closed chain exercises to emphasize serratus activation and scapular upward rotation. She does also demonstrate limitations reaching behind her back so incorporated sleeper stretch and updated her HEP. Patient would benefit from continued skilled PT to progress her mobility and strength in order to reduce pain and maximize functional ability.   OBJECTIVE IMPAIRMENTS: decreased activity tolerance, decreased knowledge of condition, decreased mobility, decreased ROM, decreased strength, increased fascial restrictions, impaired flexibility, impaired UE functional use, improper body mechanics, postural dysfunction, and pain.    ACTIVITY LIMITATIONS: carrying, lifting, and reach over head   PARTICIPATION LIMITATIONS: meal prep, cleaning, laundry, driving, shopping, occupation, and yard work   PERSONAL FACTORS: Age, Profession, and Time since onset of injury/illness/exacerbation are also affecting patient's functional outcome.      GOALS: Goals reviewed with patient? Yes   SHORT TERM GOALS: Target date: 08/21/2022   Patient will be independent and compliant with initial HEP.    Baseline: issued at eval  Goal status: met   2.  Patient will demonstrate at least 70 degrees of Lt cervical rotation AROM to improve ability to complete head turns while driving.  Baseline: see above  08/24/2022: 64 deg 08/31/22: see flow sheets Goal status: met   3.  Patient will demonstrate at least 50 degrees of cervical flexion AROM to improve tolerance to reading and work tasks.  Baseline: see above  Goal status: met   LONG TERM GOALS: Target date: 11/02/2022   Patient will demonstrate at least 160 degrees of Rt shoulder flexion AROM to improve ability to reach overhead at work.  Baseline: see above  09/21/2022: 150 deg Goal  status: ONGOING   2.  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  Baseline: unable  09/21/2022: patient reports continued limitation with reaching overhead Goal status: ONGOING   3.  Patient will score at least 66% on FOTO to signify clinically meaningful improvement in functional abilities.  Baseline: see above  09/21/2022: 47% Goal status: ONGOING   4.  Patient will report pain as </=2/10 to reduce her current functional limitations.  Baseline: see above  09/21/2022: 7/10 Goal status: ONGOING     PLAN: PT FREQUENCY: 1-2x/week   PT DURATION: 6 weeks   PLANNED INTERVENTIONS: Therapeutic exercises, Therapeutic activity, Neuromuscular re-education, Patient/Family education, Self Care, Joint mobilization, Dry Needling, Spinal manipulation, Spinal mobilization, Cryotherapy, Moist heat, Traction, Manual therapy, and Re-evaluation   PLAN FOR NEXT SESSION: Review and progress HEP prn; manual/TPDN to cervical region, cervical/shoulder mobility, strengthening as tolerated. Response to traction.    Rosana Hoes, PT, DPT, LAT, ATC 10/12/22  4:52 PM Phone: [phone] Fax: [phone]

## 2022-10-12 NOTE — Patient Instructions (Signed)
Access Code: [email or web address] URL: [email or web address] Date: 10/12/2022 Prepared by: Rosana Hoes  Exercises - Seated Upper Trapezius Stretch  - 2 x daily - 7 x weekly - 2 sets - 20 sec  hold - Seated Levator Scapulae Stretch  - 2 x daily - 7 x weekly - 2 sets - 20 sec  hold - Seated Cervical Retraction and Extension  - 6 x daily - 7 x weekly - 1 sets - 10 reps - Sidelying Thoracic Lumbar Rotation  - 2 x daily - 7 x weekly - 10 reps - Standing Shoulder Row with Anchored Resistance  - 1 x daily - 7 x weekly - 2 sets - 10 reps - Serratus Activation at Wall  - 1 x daily - 7 x weekly - 2 sets - 10 reps - Supine Suboccipital Release with Tennis Balls  - 2 x daily - 10 reps - 5 seconds hold - Supine Mid-Thoracic Spine Mobilization with Taped Tennis Balls Shoulder Flexion  - 2 x daily - 10 reps - 5 seconds hold - Thoracic Mobilization on Foam Roll - Hands Clasped  - 2 x daily - 10 reps - 5 seconds hold - Open Book Chest Rotation Stretch on Foam 1/2 Roll  - 2 x daily - 3 reps - 60 seconds hold - Prone Shoulder Row  - 1 x daily - 7 x weekly - 2 sets - 10 reps - Sleeper Stretch  - 1 x daily - 3 reps - 30 seconds hold

## 2022-10-12 NOTE — Telephone Encounter (Signed)
The patient called this morning wanting to follow-up on her refill request for Flonase. Patient notified that the rx has not been sent in yet. Dr. Sedalia Muta has been notified and has not sent the medication.

## 2022-10-13 ENCOUNTER — Other Ambulatory Visit (HOSPITAL_COMMUNITY): Payer: Self-pay

## 2022-10-14 DIAGNOSIS — G4709 Other insomnia: Secondary | ICD-10-CM | POA: Insufficient documentation

## 2022-10-14 HISTORY — DX: Other insomnia: G47.09

## 2022-10-14 NOTE — Assessment & Plan Note (Signed)

## 2022-10-14 NOTE — Assessment & Plan Note (Signed)
Start Ambien

## 2022-10-16 ENCOUNTER — Ambulatory Visit: Payer: PRIVATE HEALTH INSURANCE | Attending: Family Medicine

## 2022-10-16 ENCOUNTER — Ambulatory Visit: Payer: PRIVATE HEALTH INSURANCE

## 2022-10-16 DIAGNOSIS — G8929 Other chronic pain: Secondary | ICD-10-CM | POA: Insufficient documentation

## 2022-10-16 DIAGNOSIS — M25511 Pain in right shoulder: Secondary | ICD-10-CM | POA: Diagnosis present

## 2022-10-16 DIAGNOSIS — R252 Cramp and spasm: Secondary | ICD-10-CM | POA: Insufficient documentation

## 2022-10-16 DIAGNOSIS — R293 Abnormal posture: Secondary | ICD-10-CM | POA: Insufficient documentation

## 2022-10-16 DIAGNOSIS — M6281 Muscle weakness (generalized): Secondary | ICD-10-CM | POA: Insufficient documentation

## 2022-10-16 DIAGNOSIS — M542 Cervicalgia: Secondary | ICD-10-CM | POA: Insufficient documentation

## 2022-10-16 NOTE — Therapy (Signed)
OUTPATIENT PHYSICAL THERAPY TREATMENT NOTE/RE-CERTIFICATION   Patient Name: Brenda Mcmahon MRN: [id number] DOB:10-03-1990, 32 y.o., female Today's Date: 10/16/2022  PCP: Blane Ohara, MD  REFERRING PROVIDER: Lanell Persons, MD  Jene Every, MD        END OF SESSION:   PT End of Session - 10/16/22 1405     Visit Number 18    Number of Visits 30    Date for PT Re-Evaluation 12/02/22    Authorization Type Worker's comp 5/16- 6 new visits approved    Authorization - Visit Number 18    Authorization - Number of Visits 18    PT Start Time 1415    PT Stop Time 1500    PT Time Calculation (min) 45 min    Activity Tolerance Patient tolerated treatment well    Behavior During Therapy [facility] for tasks assessed/performed                        Past Medical History:  Diagnosis Date   Palpitation    Past Surgical History:  Procedure Laterality Date   FOOT FRACTURE SURGERY Right 2015   also ankle fracture repaired.    WRIST FRACTURE SURGERY Left    32 yo.  fell through glass door.    Patient Active Problem List   Diagnosis Date Noted   Other insomnia 10/14/2022   Routine medical exam 09/18/2021   Need for DTaP vaccine 09/18/2021   Moderate recurrent major depression (HCC) 08/07/2021   GAD (generalized anxiety disorder) 08/07/2021   Encounter for other contraceptive management 08/07/2021   Palpitation 11/10/2020   Achilles bursitis of right lower extremity 03/14/2017   Depression, major, single episode, moderate (HCC) 03/14/2017    REFERRING DIAG: W09.81XB (ICD-10-CM) - Contusion of other specified part of neck, subsequent encounter   M54.2 (ICD-10-CM) - Cervicalgia   THERAPY DIAG:  Cervicalgia  Cramp and spasm  Abnormal posture  Muscle weakness (generalized)  Rationale for Evaluation and Treatment Rehabilitation  PERTINENT HISTORY: Work-related injury on 06/30/22   PRECAUTIONS: none    SUBJECTIVE:                                                                                                                                                                                      SUBJECTIVE STATEMENT:  Patient reports having discomfort today from pulling activity at work, but otherwise has been doing well over the past week. She reports soreness, but not pain right now. Patient is supposed to f/u with referring provider on 10/18/22.She is apprehensive to return to full duty due to fear of re-injury when she knows she is still weak/painful.  PAIN:  Are you having pain? No   OBJECTIVE: (objective measures completed at initial evaluation unless otherwise dated) DIAGNOSTIC FINDINGS:  IMPRESSION: Negative cervical spine radiographs.   PATIENT SURVEYS:  FOTO 57% function to 66% predicted  09/21/2022: 47% 10/16/22: 63% function    POSTURE:  Rounded shoulders and forward head   PALPATION: Tautness and palpable tenderness Rt upper trap, cervical paraspinals, middle trap, levator scapulae, rhomboids             CERVICAL ROM:    Active ROM A/PROM (deg) eval 08/14/22 08/24/2022 08/31/22 10/06/22  Flexion 42 pn 62 "pulling"    75  Extension 50 "sore" 50 "pulling"   70  Right lateral flexion 35 pn   50   Left lateral flexion 30 pn   50   Right rotation 80 "pull"   80 85  Left rotation 60 pn  65 65 75   (Blank rows = not tested)   UPPER EXTREMITY ROM:   Active ROM Right eval Left eval Right 09/21/2022 Right 09/27/22 Rt / Lt 10/12/22 10/16/22 Right  Shoulder flexion 145 "sore" Full  150 170 pain upper trap  Full   Shoulder extension          Shoulder abduction          Shoulder adduction          Shoulder extension          Shoulder internal rotation       T10 / T6   Shoulder external rotation          Elbow flexion          Elbow extension          Wrist flexion          Wrist extension          Wrist ulnar deviation          Wrist radial deviation          Wrist pronation          Wrist supination           (Blank rows = not  tested)   UPPER EXTREMITY MMT:   MMT Right eval Left eval Right 08/24/2022 10/16/22  Shoulder flexion 4+ pn 5  5 bilateral  Shoulder extension        Shoulder abduction 5 5  5  bilateral  Shoulder adduction        Shoulder extension        Shoulder internal rotation        Shoulder external rotation        Middle trapezius 4+ pn 4+  4 4 bilateral  Lower trapezius 4- pn 4-  4- 4- bilateral  Elbow flexion        Elbow extension        Wrist flexion        Wrist extension        Wrist ulnar deviation        Wrist radial deviation        Wrist pronation        Wrist supination        Grip strength         (Blank rows = not tested)   CERVICAL SPECIAL TESTS:  Cervical compression (-) Cervical distraction  (-)    FUNCTIONAL TESTS:  N/A   TODAY'S TREATMENT [facility] Adult PT Treatment:  DATE: 10/16/22 Therapeutic Exercise: 90/90 IR x 5 Scapular clock to 90 degrees x 10  Single arm row 2 x 10 yellow band Resisted shoulder flexion 1# 2 x 10  Manual Therapy: K-tape for shoulder retraction/depression and upper trap   Therapeutic Activity: Re-assessment to determine overall progress, educating patient on progress towards goals.     [facility] Adult PT Treatment:                                                DATE: 10/12/22 Therapeutic Exercise: Sidelying thoracic rotation x 10 each Quadruped thoracic rotation x 10 each Supine pec stretch on FR Supine serratus punch on FR x 10 Quadruped serratus press x 10 Prone scapular angels x 6 Bear crawl position to pike x 10 Turkish sit-up with 5# KB bottom up x 5 each Sleeper stretch 3 x 30 sec Manual Therapy: Prone thoracic extension thrust manipulation x 3 levels   [facility] Adult PT Treatment:                                                DATE: 10/11/22 Therapeutic Exercise: Prone I, T, W, Y 10 x 5 sec each Quadruped serratus press x 10 Manual Therapy: Skilled palpation and monitoring of muscle  tension while performing TPDN Gentle suboccipital release and manual traction Passive cervical retraction, upper trap, and levator stretch K-tape for shoulder retraction/depression and upper trap  Trigger Point Dry Needling Treatment: Pre-treatment instruction: Patient instructed on dry needling rationale, procedures, and possible side effects including pain during treatment (achy,cramping feeling), bruising, drop of blood, lightheadedness, nausea, sweating. Patient Consent Given: Yes Education handout provided: Previously provided Muscles treated: Bilateral upper trap, right rhomboid region, suboccipital, splenius cervicis, cervical multifidi, right lat Needle size and number: .30x67mm x 8 Electrical stimulation performed: No Parameters: N/A Treatment response/outcome: Twitch response elicited and Palpable decrease in muscle tension Post-treatment instructions: Patient instructed to expect possible mild to moderate muscle soreness later today and/or tomorrow. Patient instructed in methods to reduce muscle soreness and to continue prescribed HEP. If patient was dry needled over the lung field, patient was instructed on signs and symptoms of pneumothorax and, however unlikely, to see immediate medical attention should they occur. Patient was also educated on signs and symptoms of infection and to seek medical attention should they occur. Patient verbalized understanding of these instructions and education.  PATIENT EDUCATION:  Education details: HEP review Person educated: Patient Education method: Explanation Education comprehension: verbalized understanding   HOME EXERCISE PROGRAM: Access Code: [id number]    ASSESSMENT: CLINICAL IMPRESSION: Patient is making gradual progress in PT following work-related injury that occurred on 06/30/22 reporting an overall improvement in her neck/shoulder pain. Her pain continues to be exacerbated with repetitive shoulder activity and overhead movement. She  demonstrates full and pain free cervical AROM and full/pain free shoulder flexion AROM. She continues to exhibit periscapular weakness and lacks overall shoulder endurance necessary for work specific activity. She will benefit from continued skilled PT to address the above lingering deficits in order to optimize her function and assist in full return to work.    OBJECTIVE IMPAIRMENTS: decreased activity tolerance, decreased knowledge of condition, decreased mobility, decreased ROM, decreased strength, increased fascial restrictions, impaired flexibility, impaired  UE functional use, improper body mechanics, postural dysfunction, and pain.    ACTIVITY LIMITATIONS: carrying, lifting, and reach over head   PARTICIPATION LIMITATIONS: meal prep, cleaning, laundry, driving, shopping, occupation, and yard work   PERSONAL FACTORS: Age, Profession, and Time since onset of injury/illness/exacerbation are also affecting patient's functional outcome.      GOALS: Goals reviewed with patient? Yes   SHORT TERM GOALS: Target date: 08/21/2022   Patient will be independent and compliant with initial HEP.    Baseline: issued at eval  Goal status: met   2.  Patient will demonstrate at least 70 degrees of Lt cervical rotation AROM to improve ability to complete head turns while driving.  Baseline: see above  08/24/2022: 64 deg 08/31/22: see flow sheets Goal status: met   3.  Patient will demonstrate at least 50 degrees of cervical flexion AROM to improve tolerance to reading and work tasks.  Baseline: see above  Goal status: met   LONG TERM GOALS: Target date: 11/02/2022   Patient will demonstrate at least 160 degrees of Rt shoulder flexion AROM to improve ability to reach overhead at work.  Baseline: see above  09/21/2022: 150 deg Goal status: met   2.  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  Baseline: unable  09/21/2022: patient reports continued limitation with reaching overhead 10/16/22: 45 seconds Goal status: progressing   3.  Patient will score at least 66% on FOTO to signify clinically meaningful improvement in functional abilities.  Baseline: see above  09/21/2022: 47% Goal status: progressing   4.  Patient will report pain as </=2/10 to reduce her current functional limitations.  Baseline: see above  09/21/2022: 7/10 10/16/22: at worst 6/10  Goal status: progressing   5. Patient will demonstrate 5/5 middle trap strength to improve ability to complete pulling activity at work.    Baseline: see above  Goal status: NEW     PLAN: PT FREQUENCY: 1-2x/week   PT DURATION: 6 weeks   PLANNED INTERVENTIONS: Therapeutic exercises, Therapeutic activity, Neuromuscular re-education, Patient/Family education, Self Care, Joint mobilization, Dry Needling, Spinal manipulation, Spinal mobilization, Cryotherapy, Moist heat, Traction, Manual therapy, and Re-evaluation   PLAN FOR NEXT SESSION: Review and progress HEP prn; manual/TPDN to cervical region, cervical/shoulder mobility, strengthening as tolerated. Letitia Libra, PT, DPT, ATC 10/16/22 3:01 PM

## 2022-10-18 ENCOUNTER — Ambulatory Visit: Payer: PRIVATE HEALTH INSURANCE

## 2022-10-24 ENCOUNTER — Other Ambulatory Visit (HOSPITAL_COMMUNITY): Payer: Self-pay

## 2022-10-30 ENCOUNTER — Telehealth: Payer: Self-pay

## 2022-10-30 NOTE — Telephone Encounter (Signed)
LVM asking patient to call back and schedule further PT visits.   Letitia Libra, PT, DPT, ATC 10/30/22 12:28 PM

## 2022-11-06 ENCOUNTER — Ambulatory Visit: Payer: PRIVATE HEALTH INSURANCE | Attending: Orthopedic Surgery | Admitting: Physical Therapy

## 2022-11-06 DIAGNOSIS — M542 Cervicalgia: Secondary | ICD-10-CM | POA: Insufficient documentation

## 2022-11-06 DIAGNOSIS — R252 Cramp and spasm: Secondary | ICD-10-CM | POA: Insufficient documentation

## 2022-11-06 DIAGNOSIS — R293 Abnormal posture: Secondary | ICD-10-CM | POA: Insufficient documentation

## 2022-11-06 DIAGNOSIS — M25511 Pain in right shoulder: Secondary | ICD-10-CM | POA: Diagnosis present

## 2022-11-06 DIAGNOSIS — G8929 Other chronic pain: Secondary | ICD-10-CM | POA: Insufficient documentation

## 2022-11-06 DIAGNOSIS — M6281 Muscle weakness (generalized): Secondary | ICD-10-CM | POA: Insufficient documentation

## 2022-11-06 NOTE — Therapy (Signed)
OUTPATIENT PHYSICAL THERAPY TREATMENT NOTE/RE-CERTIFICATION   Patient Name: Brenda Mcmahon MRN: [id number] DOB:[birth year], 32 y.o., female Today's Date: 11/06/2022  PCP: Blane Ohara, MD  REFERRING PROVIDER: Lanell Persons, MD  Jene Every, MD        END OF SESSION:                Past Medical History:  Diagnosis Date   Palpitation    Past Surgical History:  Procedure Laterality Date   FOOT FRACTURE SURGERY Right 2015   also ankle fracture repaired.    WRIST FRACTURE SURGERY Left    32 yo.  fell through glass door.    Patient Active Problem List   Diagnosis Date Noted   Other insomnia 10/14/2022   Routine medical exam 09/18/2021   Need for DTaP vaccine 09/18/2021   Moderate recurrent major depression (HCC) 08/07/2021   GAD (generalized anxiety disorder) 08/07/2021   Encounter for other contraceptive management 08/07/2021   Palpitation 11/10/2020   Achilles bursitis of right lower extremity 03/14/2017   Depression, major, single episode, moderate (HCC) 03/14/2017    REFERRING DIAG: B28.41LK (ICD-10-CM) - Contusion of other specified part of neck, subsequent encounter   M54.2 (ICD-10-CM) - Cervicalgia   THERAPY DIAG:  No diagnosis found.  Rationale for Evaluation and Treatment Rehabilitation  PERTINENT HISTORY: Work-related injury on 06/30/22   PRECAUTIONS: none    SUBJECTIVE:                                                                                                                                                                                     SUBJECTIVE STATEMENT:  Patient reports having discomfort today from pulling activity at work, but otherwise has been doing well over the past week. She reports soreness, but not pain right now. Patient is supposed to f/u with referring provider on 10/18/22.She is apprehensive to return to full duty due to fear of re-injury when she knows she is still weak/painful.  PAIN:  Are you having pain?  No   OBJECTIVE: (objective measures completed at initial evaluation unless otherwise dated) DIAGNOSTIC FINDINGS:  IMPRESSION: Negative cervical spine radiographs.   PATIENT SURVEYS:  FOTO 57% function to 66% predicted  09/21/2022: 47% 10/16/22: 63% function    POSTURE:  Rounded shoulders and forward head   PALPATION: Tautness and palpable tenderness Rt upper trap, cervical paraspinals, middle trap, levator scapulae, rhomboids             CERVICAL ROM:    Active ROM A/PROM (deg) eval 08/14/22 08/24/2022 08/31/22 10/06/22  Flexion 42 pn 62 "pulling"    75  Extension 50 "sore" 50 "pulling"   70  Right lateral flexion  35 pn   50   Left lateral flexion 30 pn   50   Right rotation 80 "pull"   80 85  Left rotation 60 pn  65 65 75   (Blank rows = not tested)   UPPER EXTREMITY ROM:   Active ROM Right eval Left eval Right 09/21/2022 Right 09/27/22 Rt / Lt 10/12/22 10/16/22 Right  Shoulder flexion 145 "sore" Full  150 170 pain upper trap  Full   Shoulder extension          Shoulder abduction          Shoulder adduction          Shoulder extension          Shoulder internal rotation       T10 / T6   Shoulder external rotation          Elbow flexion          Elbow extension          Wrist flexion          Wrist extension          Wrist ulnar deviation          Wrist radial deviation          Wrist pronation          Wrist supination           (Blank rows = not tested)   UPPER EXTREMITY MMT:   MMT Right eval Left eval Right 08/24/2022 10/16/22  Shoulder flexion 4+ pn 5  5 bilateral  Shoulder extension        Shoulder abduction 5 5  5  bilateral  Shoulder adduction        Shoulder extension        Shoulder internal rotation        Shoulder external rotation        Middle trapezius 4+ pn 4+  4 4 bilateral  Lower trapezius 4- pn 4-  4- 4- bilateral  Elbow flexion        Elbow extension        Wrist flexion        Wrist extension        Wrist ulnar deviation        Wrist radial  deviation        Wrist pronation        Wrist supination        Grip strength         (Blank rows = not tested)   CERVICAL SPECIAL TESTS:  Cervical compression (-) Cervical distraction  (-)    FUNCTIONAL TESTS:  N/A   TODAY'S TREATMENT [facility] Adult PT Treatment:                                                DATE: 11/06/22 Therapeutic Exercise: Sidelying thoracic rotation x 10 each Quadruped thoracic rotation x 10 each Supine pec stretch on FR Supine serratus punch on FR x 10 Quadruped serratus press x 10 Prone scapular angels x 6 Bear crawl position to pike x 10 Turkish sit-up with 5# KB bottom up x 5 each Sleeper stretch 3 x 30 sec Manual Therapy: Prone thoracic extension thrust manipulation x 3 levels   [facility] Adult PT Treatment:  DATE: 10/16/22 Therapeutic Exercise: 90/90 IR x 5 Scapular clock to 90 degrees x 10  Single arm row 2 x 10 yellow band Resisted shoulder flexion 1# 2 x 10  Manual Therapy: K-tape for shoulder retraction/depression and upper trap  Therapeutic Activity: Re-assessment to determine overall progress, educating patient on progress towards goals.   [facility] Adult PT Treatment:                                                DATE: 10/12/22 Therapeutic Exercise: Sidelying thoracic rotation x 10 each Quadruped thoracic rotation x 10 each Supine pec stretch on FR Supine serratus punch on FR x 10 Quadruped serratus press x 10 Prone scapular angels x 6 Bear crawl position to pike x 10 Turkish sit-up with 5# KB bottom up x 5 each Sleeper stretch 3 x 30 sec Manual Therapy: Prone thoracic extension thrust manipulation x 3 levels  [facility] Adult PT Treatment:                                                DATE: 10/11/22 Therapeutic Exercise: Prone I, T, W, Y 10 x 5 sec each Quadruped serratus press x 10 Manual Therapy: Skilled palpation and monitoring of muscle tension while performing TPDN Gentle suboccipital  release and manual traction Passive cervical retraction, upper trap, and levator stretch K-tape for shoulder retraction/depression and upper trap  Trigger Point Dry Needling Treatment: Pre-treatment instruction: Patient instructed on dry needling rationale, procedures, and possible side effects including pain during treatment (achy,cramping feeling), bruising, drop of blood, lightheadedness, nausea, sweating. Patient Consent Given: Yes Education handout provided: Previously provided Muscles treated: Bilateral upper trap, right rhomboid region, suboccipital, splenius cervicis, cervical multifidi, right lat Needle size and number: .30x65mm x 8 Electrical stimulation performed: No Parameters: N/A Treatment response/outcome: Twitch response elicited and Palpable decrease in muscle tension Post-treatment instructions: Patient instructed to expect possible mild to moderate muscle soreness later today and/or tomorrow. Patient instructed in methods to reduce muscle soreness and to continue prescribed HEP. If patient was dry needled over the lung field, patient was instructed on signs and symptoms of pneumothorax and, however unlikely, to see immediate medical attention should they occur. Patient was also educated on signs and symptoms of infection and to seek medical attention should they occur. Patient verbalized understanding of these instructions and education.  PATIENT EDUCATION:  Education details: HEP review Person educated: Patient Education method: Explanation Education comprehension: verbalized understanding   HOME EXERCISE PROGRAM: Access Code: [email or web address]    ASSESSMENT: CLINICAL IMPRESSION: Patient tolerated therapy well with no adverse effects. *** She would benefit from continued skilled PT to progress her mobility and strength in order to reduce pain and maximize functional ability.  Patient is making gradual progress in PT following work-related injury that occurred on 06/30/22  reporting an overall improvement in her neck/shoulder pain. Her pain continues to be exacerbated with repetitive shoulder activity and overhead movement. She demonstrates full and pain free cervical AROM and full/pain free shoulder flexion AROM. She continues to exhibit periscapular weakness and lacks overall shoulder endurance necessary for work specific activity. She will benefit from continued skilled PT to address the above lingering deficits in order to optimize her function and assist in  full return to work.    OBJECTIVE IMPAIRMENTS: decreased activity tolerance, decreased knowledge of condition, decreased mobility, decreased ROM, decreased strength, increased fascial restrictions, impaired flexibility, impaired UE functional use, improper body mechanics, postural dysfunction, and pain.    ACTIVITY LIMITATIONS: carrying, lifting, and reach over head   PARTICIPATION LIMITATIONS: meal prep, cleaning, laundry, driving, shopping, occupation, and yard work   PERSONAL FACTORS: Age, Profession, and Time since onset of injury/illness/exacerbation are also affecting patient's functional outcome.      GOALS: Goals reviewed with patient? Yes   SHORT TERM GOALS: Target date: 08/21/2022   Patient will be independent and compliant with initial HEP.    Baseline: issued at eval  Goal status: met   2.  Patient will demonstrate at least 70 degrees of Lt cervical rotation AROM to improve ability to complete head turns while driving.  Baseline: see above  08/24/2022: 64 deg 08/31/22: see flow sheets Goal status: met   3.  Patient will demonstrate at least 50 degrees of cervical flexion AROM to improve tolerance to reading and work tasks.  Baseline: see above  Goal status: met   LONG TERM GOALS: Target date: 12/04/2022   Patient will demonstrate at least 160 degrees of Rt shoulder flexion AROM to improve ability to reach overhead at work.  Baseline: see above  09/21/2022: 150 deg Goal status: met   2.   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  Baseline: unable  09/21/2022: patient reports continued limitation with reaching overhead 10/16/22: 45 seconds Goal status: progressing   3.  Patient will score at least 66% on FOTO to signify clinically meaningful improvement in functional abilities.  Baseline: see above  09/21/2022: 47% Goal status: progressing   4.  Patient will report pain as </=2/10 to reduce her current functional limitations.  Baseline: see above  09/21/2022: 7/10 10/16/22: at worst 6/10  Goal status: progressing   5. Patient will demonstrate 5/5 middle trap strength to improve ability to complete pulling activity at work.    Baseline: see above  Goal status: NEW     PLAN: PT FREQUENCY: 1-2x/week   PT DURATION: 4 weeks   PLANNED INTERVENTIONS: Therapeutic exercises, Therapeutic activity, Neuromuscular re-education, Patient/Family education, Self Care, Joint mobilization, Dry Needling, Spinal manipulation, Spinal mobilization, Cryotherapy, Moist heat, Traction, Manual therapy, and Re-evaluation   PLAN FOR NEXT SESSION: Review and progress HEP prn; manual/TPDN to cervical region, cervical/shoulder mobility, strengthening as tolerated.   Rosana Hoes, PT, DPT, LAT, ATC 11/06/22  12:48 PM Phone: [phone] Fax: [phone]

## 2022-11-07 ENCOUNTER — Other Ambulatory Visit (HOSPITAL_COMMUNITY): Payer: Self-pay

## 2022-11-07 ENCOUNTER — Encounter: Payer: Self-pay | Admitting: Physical Therapy

## 2022-11-07 ENCOUNTER — Other Ambulatory Visit: Payer: Self-pay

## 2022-11-07 ENCOUNTER — Ambulatory Visit (INDEPENDENT_AMBULATORY_CARE_PROVIDER_SITE_OTHER): Payer: 59 | Admitting: Family Medicine

## 2022-11-07 VITALS — BP 124/76 | HR 94 | Temp 97.6°F | Ht 62.0 in | Wt 124.0 lb

## 2022-11-07 DIAGNOSIS — G4709 Other insomnia: Secondary | ICD-10-CM

## 2022-11-07 DIAGNOSIS — F321 Major depressive disorder, single episode, moderate: Secondary | ICD-10-CM

## 2022-11-07 DIAGNOSIS — F332 Major depressive disorder, recurrent severe without psychotic features: Secondary | ICD-10-CM | POA: Diagnosis not present

## 2022-11-07 DIAGNOSIS — F411 Generalized anxiety disorder: Secondary | ICD-10-CM

## 2022-11-07 MED ORDER — DESVENLAFAXINE SUCCINATE ER 50 MG PO TB24
50.0000 mg | ORAL_TABLET | Freq: Every day | ORAL | 1 refills | Status: DC
  Filled 2022-11-07: qty 30, 30d supply, fill #0

## 2022-11-07 NOTE — Assessment & Plan Note (Addendum)
Increased Pristiq to 50 mg daily. Advised speaking with a counselor. Patient requesting FMLA papers.

## 2022-11-07 NOTE — Progress Notes (Unsigned)
Subjective:  Patient ID: Brenda Mcmahon, female    DOB: 04/24/1990  Age: 32 y.o. MRN: [id number]  Chief Complaint  Patient presents with   Anxiety   Depression    HPI Patient presents for 4 week follow up, has been taking Pristiq daily is unsure if whether or not it is helping. Has not taken ambien in the past week due to going to bed at midnight. Has not made appt with counselor. Expressed concerns for her mother and her health, has concerns that she is keeping "bad news" from her and her siblings.     11/07/2022    4:05 PM 10/10/2022    2:29 PM 09/13/2021   11:19 AM 08/03/2021   10:14 AM 08/30/2020    4:32 PM  Depression screen PHQ 2/9  Decreased Interest 2 2 1 2 3   Down, Depressed, Hopeless 2 2 1 2 3   PHQ - 2 Score 4 4 2 4 6   Altered sleeping 2 3 3 1 3   Tired, decreased energy 2 3 1 2 3   Change in appetite 2 3 1 2 3   Feeling bad or failure about yourself  2 3 1 2 3   Trouble concentrating 2 3 1 2 3   Moving slowly or fidgety/restless 2 3 2 2 3   Suicidal thoughts 2 3 0 0 0  PHQ-9 Score 18 25 11 15 24   Difficult doing work/chores Somewhat difficult Very difficult Somewhat difficult Somewhat difficult Somewhat difficult        10/10/2022    2:29 PM  Fall Risk   Falls in the past year? 0  Number falls in past yr: 0  Injury with Fall? 0  Risk for fall due to : No Fall Risks  Follow up Falls evaluation completed    Patient Care Team: Blane Ohara, MD as PCP - General (Family Medicine)   Review of Systems  Constitutional:  Negative for chills, fatigue and fever.  HENT:  Negative for congestion, ear pain, rhinorrhea and sore throat.   Respiratory:  Negative for cough and shortness of breath.   Cardiovascular:  Negative for chest pain.  Gastrointestinal:  Negative for abdominal pain, constipation, diarrhea, nausea and vomiting.  Genitourinary:  Negative for dysuria and urgency.  Musculoskeletal:  Negative for back pain and myalgias.  Neurological:  Negative for  dizziness, weakness, light-headedness and headaches.  Psychiatric/Behavioral:  Positive for dysphoric mood, sleep disturbance and suicidal ideas. The patient is nervous/anxious.     Current Outpatient Medications on File Prior to Visit  Medication Sig Dispense Refill   zolpidem (AMBIEN) 5 MG tablet Take 1 tablet (5 mg total) by mouth at bedtime as needed for sleep. 30 tablet 2   acebutolol (SECTRAL) 200 MG capsule Take 1 capsule (200 mg total) by mouth 2 (two) times daily. 180 capsule 3   fluticasone (FLONASE) 50 MCG/ACT nasal spray Place 2 sprays into both nostrils daily. 16 g 6   No current facility-administered medications on file prior to visit.   Past Medical History:  Diagnosis Date   Palpitation    Past Surgical History:  Procedure Laterality Date   FOOT FRACTURE SURGERY Right 2015   also ankle fracture repaired.    WRIST FRACTURE SURGERY Left    32 yo.  fell through glass door.     Family History  Problem Relation Age of Onset   Seizures Mother    Hypotension Mother    Asthma Brother    Social History   Socioeconomic History   Marital  status: Single    Spouse name: Not on file   Number of children: Not on file   Years of education: Not on file   Highest education level: Not on file  Occupational History   Occupation: Pharmacologist    Employer: [employer]  Tobacco Use   Smoking status: Never   Smokeless tobacco: Never  Substance and Sexual Activity   Alcohol use: No   Drug use: Never   Sexual activity: Not on file  Other Topics Concern   Not on file  Social History Narrative   Not on file   Social Determinants of Health   Financial Resource Strain: Low Risk  (09/13/2021)   Overall Financial Resource Strain (CARDIA)    Difficulty of Paying Living Expenses: Not very hard  Food Insecurity: No Food Insecurity (09/13/2021)   Hunger Vital Sign    Worried About Running Out of Food in the Last Year: Never true    Ran Out of Food in the Last Year: Never  true  Transportation Needs: No Transportation Needs (09/13/2021)   PRAPARE - Administrator, Civil Service (Medical): No    Lack of Transportation (Non-Medical): No  Physical Activity: Inactive (10/10/2022)   Exercise Vital Sign    Days of Exercise per Week: 0 days    Minutes of Exercise per Session: 0 min  Stress: No Stress Concern Present (10/10/2022)   [employer] of Occupational Health - Occupational Stress Questionnaire    Feeling of Stress : Not at all  Social Connections: Moderately Isolated (10/10/2022)   Social Connection and Isolation Panel [NHANES]    Frequency of Communication with Friends and Family: Three times a week    Frequency of Social Gatherings with Friends and Family: Three times a week    Attends Religious Services: More than 4 times per year    Active Member of Clubs or Organizations: No    Attends Banker Meetings: Never    Marital Status: Never married    Objective:  BP 124/76   Pulse 94   Temp 97.6 F (36.4 C)   Ht 5\' 2"  (1.575 m)   Wt 124 lb (56.2 kg)   LMP 09/17/2022   SpO2 99%   BMI 22.68 kg/m      11/07/2022    3:48 PM 10/10/2022    2:27 PM 09/07/2022    3:39 PM  BP/Weight  Systolic BP 124 110 104  Diastolic BP 76 64 60  Wt. (Lbs) 124 131 132  BMI 22.68 kg/m2 23.96 kg/m2 24.14 kg/m2    Physical Exam Vitals reviewed.  Constitutional:      Appearance: Normal appearance. She is normal weight.  Cardiovascular:     Rate and Rhythm: Normal rate and regular rhythm.     Heart sounds: Normal heart sounds.  Pulmonary:     Effort: Pulmonary effort is normal. No respiratory distress.     Breath sounds: Normal breath sounds.  Abdominal:     General: Abdomen is flat. Bowel sounds are normal.     Palpations: Abdomen is soft.     Tenderness: There is no abdominal tenderness.  Neurological:     Mental Status: She is alert and oriented to person, place, and time.  Psychiatric:        Behavior: Behavior normal.      Comments: Sad. Tearful.     Diabetic Foot Exam - Simple   No data filed      Lab Results  Component Value Date  WBC 6.7 10/10/2022   HGB 12.3 10/10/2022   HCT 37.8 10/10/2022   PLT 197 10/10/2022   GLUCOSE 84 09/13/2021   CHOL 160 10/10/2022   TRIG 91 10/10/2022   HDL 63 10/10/2022   LDLCALC 80 10/10/2022   ALT 14 09/13/2021   AST 20 09/13/2021   NA 137 09/13/2021   K 4.5 09/13/2021   CL 102 09/13/2021   CREATININE 0.88 09/13/2021   BUN 7 09/13/2021   CO2 22 09/13/2021   TSH 1.530 10/10/2022      Assessment & Plan:    Other insomnia Assessment & Plan: The current medical regimen is effective;  continue present plan and medication.  Encourage to made an appointment with counselor.   Depression, major, single episode, moderate (HCC) Assessment & Plan: Increased Pristiq to 50 mg daily. Advised speaking with a counselor. Patient requesting FMLA papers.  Orders: -     Desvenlafaxine Succinate ER; Take 1 tablet (50 mg total) by mouth daily.  Dispense: 30 tablet; Refill: 1  GAD (generalized anxiety disorder) Assessment & Plan: Increase Pristiq to 50 mg daily. Continue counseling.    Severe episode of recurrent major depressive disorder, without psychotic features (HCC) Assessment & Plan: Increased Pristiq to 50 mg daily. Advised speaking with a counselor. Patient requesting FMLA papers.      Meds ordered this encounter  Medications   desvenlafaxine (PRISTIQ) 50 MG 24 hr tablet    Sig: Take 1 tablet (50 mg total) by mouth daily.    Dispense:  30 tablet    Refill:  1    No orders of the defined types were placed in this encounter.    Follow-up: Return in about 1 month (around 12/08/2022) for Depression/Anxiety.   I,Katherina A Bramblett,acting as a scribe for Blane Ohara, MD.,have documented all relevant documentation on the behalf of Blane Ohara, MD,as directed by  Blane Ohara, MD while in the presence of Blane Ohara, MD.    Clayborn Bigness I  Leal-Borjas,acting as a scribe for Blane Ohara, MD.,have documented all relevant documentation on the behalf of Blane Ohara, MD,as directed by  Blane Ohara, MD while in the presence of Blane Ohara, MD.   An After Visit Summary was printed and given to the patient.  Blane Ohara, MD [facility] [phone]

## 2022-11-09 ENCOUNTER — Ambulatory Visit: Payer: PRIVATE HEALTH INSURANCE

## 2022-11-09 DIAGNOSIS — G8929 Other chronic pain: Secondary | ICD-10-CM

## 2022-11-09 DIAGNOSIS — M542 Cervicalgia: Secondary | ICD-10-CM | POA: Diagnosis not present

## 2022-11-09 DIAGNOSIS — R252 Cramp and spasm: Secondary | ICD-10-CM

## 2022-11-09 DIAGNOSIS — R293 Abnormal posture: Secondary | ICD-10-CM

## 2022-11-09 DIAGNOSIS — M6281 Muscle weakness (generalized): Secondary | ICD-10-CM

## 2022-11-09 NOTE — Therapy (Signed)
OUTPATIENT PHYSICAL THERAPY TREATMENT NOTE   Patient Name: Brenda Mcmahon MRN: [id number] DOB:[birth year], 32 y.o., female Today's Date: 11/09/2022  PCP: Blane Ohara, MD  REFERRING PROVIDER:  Lanell Persons, MD - 07/18/2022 Jene Every, MD - 09/21/2023 Dorothy Spark, PA-C - 10/30/2022    END OF SESSION:   PT End of Session - 11/09/22 1616     Visit Number 20    Number of Visits 27    Date for PT Re-Evaluation 12/04/22    Authorization Type WC    Authorization Time Period 2 x week until 8/9.    PT Start Time 1616    PT Stop Time 1659    PT Time Calculation (min) 43 min    Activity Tolerance Patient tolerated treatment well    Behavior During Therapy WFL for tasks assessed/performed                         Past Medical History:  Diagnosis Date   Palpitation    Past Surgical History:  Procedure Laterality Date   FOOT FRACTURE SURGERY Right 2015   also ankle fracture repaired.    WRIST FRACTURE SURGERY Left    32 yo.  fell through glass door.    Patient Active Problem List   Diagnosis Date Noted   Other insomnia 10/14/2022   Routine medical exam 09/18/2021   Need for DTaP vaccine 09/18/2021   Moderate recurrent major depression (HCC) 08/07/2021   GAD (generalized anxiety disorder) 08/07/2021   Encounter for other contraceptive management 08/07/2021   Palpitation 11/10/2020   Achilles bursitis of right lower extremity 03/14/2017   Depression, major, single episode, moderate (HCC) 03/14/2017    REFERRING DIAG: W09.81XB (ICD-10-CM) - Contusion of other specified part of neck, subsequent encounter   M54.2 (ICD-10-CM) - Cervicalgia   THERAPY DIAG:  Cervicalgia  Chronic right shoulder pain  Cramp and spasm  Abnormal posture  Muscle weakness (generalized)  Rationale for Evaluation and Treatment Rehabilitation  PERTINENT HISTORY: Work-related injury on 06/30/22   PRECAUTIONS: none    SUBJECTIVE:                                                                                                                                                                                      SUBJECTIVE STATEMENT:  "I'm sore in my back."  PAIN:  Are you having pain? Yes:  NPRS scale: 7/10 Pain location: mid/upper T-spine; Rt shoulder blade  Pain description: sore Aggravating factors: Overhead reach, repetitive shoulder activity Relieving factors: Taping, stretching, needling    OBJECTIVE: (objective measures completed at initial evaluation unless otherwise dated) DIAGNOSTIC FINDINGS:  IMPRESSION: Negative cervical  spine radiographs.   PATIENT SURVEYS:  FOTO 57% function to 66% predicted  09/21/2022: 47% 10/16/22: 63% function  11/06/2022: not assessed   POSTURE:  Rounded shoulders and forward head   PALPATION: Tautness and palpable tenderness Rt upper trap, cervical paraspinals, middle trap, levator scapulae, rhomboids             CERVICAL ROM:    Active ROM A/PROM (deg) eval 08/14/22 08/24/2022 08/31/22 10/06/22 11/06/2022  Flexion 42 pn 62 "pulling"    75 WFL  Extension 50 "sore" 50 "pulling"   70 WFL  Right lateral flexion 35 pn   50  WFL  Left lateral flexion 30 pn   50  WFL  Right rotation 80 "pull"   80 85 WFL  Left rotation 60 pn  65 65 75 WFL   (Blank rows = not tested)   UPPER EXTREMITY ROM:   Active ROM Right eval Left eval Right 09/21/2022 Right 09/27/22 Rt / Lt 10/12/22 10/16/22 Right Rt / Lt 11/06/2022 11/09/22 right  Shoulder flexion 145 "sore" Full  150 170 pain upper trap  Full  150 / 170 165  Shoulder extension            Shoulder abduction            Shoulder adduction            Shoulder extension            Shoulder internal rotation       T10 / T6     Shoulder external rotation            Elbow flexion            Elbow extension            Wrist flexion            Wrist extension            Wrist ulnar deviation            Wrist radial deviation            Wrist pronation            Wrist  supination             (Blank rows = not tested)   UPPER EXTREMITY MMT:   MMT Right eval Left eval Right 08/24/2022 10/16/22 Rt / Lt 11/06/2022  Shoulder flexion 4+ pn 5  5 bilateral   Shoulder extension         Shoulder abduction 5 5  5  bilateral   Shoulder adduction         Shoulder extension         Shoulder internal rotation         Shoulder external rotation         Middle trapezius 4+ pn 4+  4 4 bilateral 4 / 4  Lower trapezius 4- pn 4-  4- 4- bilateral 4- / 4-  Elbow flexion         Elbow extension         Wrist flexion         Wrist extension         Wrist ulnar deviation         Wrist radial deviation         Wrist pronation         Wrist supination         Grip strength          (  Blank rows = not tested)   CERVICAL SPECIAL TESTS:  Cervical compression (-) Cervical distraction  (-)    FUNCTIONAL TESTS:  N/A   TODAY'S TREATMENT [facility] Adult PT Treatment:                                                DATE: 11/09/22 Therapeutic Exercise: UBE level 2 x 4 minutes  90/90 position with horizontal shoulder abduction red band 2 x 10  Standing serratus punch attempted with black band, unable; no resistance 2 x 10   Neuromuscular re-ed: Shoulder elevation and abduction with mirror for visual feedback and heavy cues on postural correction and core engagement multiple reps     [facility] Adult PT Treatment:                                                DATE: 11/06/22 Therapeutic Exercise: Supine thoracic mobs with peanut ball combined with shoulder flexion at various levels of upper thoracic spine Sleeper stretch 3 x 30 sec on right Doorway pec stretch 3 x 30 sec Prone single arm T and Y with thumb up off edge of table x 10 each Supine serratus punch x 10 Manual Therapy: Skilled palpation and monitoring of muscle tension while performing TPDN Prone thoracic extension thrust manipulation Right GHJ and scapulothoracic mobs all directions Right shoulder PROM all  directions Scap pinned shoulder stretching flexion and cross body K-tape for shoulder depression and retraction / postural correction Trigger Point Dry Needling Treatment: Pre-treatment instruction: Patient instructed on dry needling rationale, procedures, and possible side effects including pain during treatment (achy,cramping feeling), bruising, drop of blood, lightheadedness, nausea, sweating. Patient Consent Given: Yes Education handout provided: Previously provided Muscles treated: Right upper trap, rhomboid region, subscap, lat region Needle size and number: .30x42mm x 8 Electrical stimulation performed: No Parameters: N/A Treatment response/outcome: Twitch response elicited and Palpable decrease in muscle tension Post-treatment instructions: Patient instructed to expect possible mild to moderate muscle soreness later today and/or tomorrow. Patient instructed in methods to reduce muscle soreness and to continue prescribed HEP. If patient was dry needled over the lung field, patient was instructed on signs and symptoms of pneumothorax and, however unlikely, to see immediate medical attention should they occur. Patient was also educated on signs and symptoms of infection and to seek medical attention should they occur. Patient verbalized understanding of these instructions and education. Self care: Instruction and demonstration of postural brace   [facility] Adult PT Treatment:                                                DATE: 10/16/22 Therapeutic Exercise: 90/90 IR x 5 Scapular clock to 90 degrees x 10  Single arm row 2 x 10 yellow band Resisted shoulder flexion 1# 2 x 10  Manual Therapy: K-tape for shoulder retraction/depression and upper trap  Therapeutic Activity: Re-assessment to determine overall progress, educating patient on progress towards goals.   [facility] Adult PT Treatment:  DATE: 10/12/22 Therapeutic Exercise: Sidelying thoracic  rotation x 10 each Quadruped thoracic rotation x 10 each Supine pec stretch on FR Supine serratus punch on FR x 10 Quadruped serratus press x 10 Prone scapular angels x 6 Bear crawl position to pike x 10 Turkish sit-up with 5# KB bottom up x 5 each Sleeper stretch 3 x 30 sec Manual Therapy: Prone thoracic extension thrust manipulation x 3 levels  [facility] Adult PT Treatment:                                                DATE: 10/11/22 Therapeutic Exercise: Prone I, T, W, Y 10 x 5 sec each Quadruped serratus press x 10 Manual Therapy: Skilled palpation and monitoring of muscle tension while performing TPDN Gentle suboccipital release and manual traction Passive cervical retraction, upper trap, and levator stretch K-tape for shoulder retraction/depression and upper trap  Trigger Point Dry Needling Treatment: Pre-treatment instruction: Patient instructed on dry needling rationale, procedures, and possible side effects including pain during treatment (achy,cramping feeling), bruising, drop of blood, lightheadedness, nausea, sweating. Patient Consent Given: Yes Education handout provided: Previously provided Muscles treated: Bilateral upper trap, right rhomboid region, suboccipital, splenius cervicis, cervical multifidi, right lat Needle size and number: .30x42mm x 8 Electrical stimulation performed: No Parameters: N/A Treatment response/outcome: Twitch response elicited and Palpable decrease in muscle tension Post-treatment instructions: Patient instructed to expect possible mild to moderate muscle soreness later today and/or tomorrow. Patient instructed in methods to reduce muscle soreness and to continue prescribed HEP. If patient was dry needled over the lung field, patient was instructed on signs and symptoms of pneumothorax and, however unlikely, to see immediate medical attention should they occur. Patient was also educated on signs and symptoms of infection and to seek medical attention  should they occur. Patient verbalized understanding of these instructions and education.  PATIENT EDUCATION:  Education details: HEP review Person educated: Patient Education method: Explanation Education comprehension: verbalized understanding   HOME EXERCISE PROGRAM: Access Code: [email or web address]    ASSESSMENT: CLINICAL IMPRESSION: Patient tolerated session well today with heavy emphasis on postural corrections with overhead movement. She has difficulty controlling for excessive lumbar lordosis and rounded shoulders as well as activating her core with active shoulder flexion/abduction. With use of mirror for visual feedback and tactile cues she is able to achieve proper alignment and improve her shoulder flexion range before experiencing pain. Added core stabilization with postural strengthening today to improve overall trunk stability with good tolerance. She has significant difficulty with scapular protraction having tendency to compensate with trunk rotation requiring further emphasis on strengthening at future sessions.    OBJECTIVE IMPAIRMENTS: decreased activity tolerance, decreased knowledge of condition, decreased mobility, decreased ROM, decreased strength, increased fascial restrictions, impaired flexibility, impaired UE functional use, improper body mechanics, postural dysfunction, and pain.    ACTIVITY LIMITATIONS: carrying, lifting, and reach over head   PARTICIPATION LIMITATIONS: meal prep, cleaning, laundry, driving, shopping, occupation, and yard work   PERSONAL FACTORS: Age, Profession, and Time since onset of injury/illness/exacerbation are also affecting patient's functional outcome.      GOALS: Goals reviewed with patient? Yes   SHORT TERM GOALS: Target date: 08/21/2022   Patient will be independent and compliant with initial HEP.    Baseline: issued at eval  Goal status: met   2.  Patient will demonstrate at least 70 degrees  of Lt cervical rotation AROM to improve  ability to complete head turns while driving.  Baseline: see above  08/24/2022: 64 deg 08/31/22: see flow sheets Goal status: met   3.  Patient will demonstrate at least 50 degrees of cervical flexion AROM to improve tolerance to reading and work tasks.  Baseline: see above  Goal status: met   LONG TERM GOALS: Target date: 12/04/2022   Patient will demonstrate at least 160 degrees of Rt shoulder flexion AROM to improve ability to reach overhead at work.  Baseline: see above  09/21/2022: 150 deg 11/06/2022: 150 deg Goal status: ONGOING   2.  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  Baseline: unable  09/21/2022: patient reports continued limitation with reaching overhead 10/16/22: 45 seconds 11/06/2022: patient reports continued limitation Goal status: ONGOING   3.  Patient will score at least 66% on FOTO to signify clinically meaningful improvement in functional abilities.  Baseline: see above  09/21/2022: 47% 11/06/2022: not assessed Goal status: ONGOING   4.  Patient will report pain as </=2/10 to reduce her current functional limitations.  Baseline: see above  09/21/2022: 7/10 10/16/22: at worst 6/10  11/06/2022: 4/10 Goal status: ONGOING  5. Patient will demonstrate 5/5 middle trap strength to improve ability to complete pulling activity at work.    Baseline: see above  11/06/2022: limitations noted above  Goal status: ONGOING     PLAN: PT FREQUENCY: 2x/week   PT DURATION: 4 weeks   PLANNED INTERVENTIONS: Therapeutic exercises, Therapeutic activity, Neuromuscular re-education, Patient/Family education, Self Care, Joint mobilization, Dry Needling, Spinal manipulation, Spinal mobilization, Cryotherapy, Moist heat, Traction, Manual therapy, and Re-evaluation   PLAN FOR NEXT SESSION: Review and progress HEP PRN, manual/TPDN to cervical and right shoulder region,  cervical/thoracic/shoulder mobility, postural strengthening Letitia Libra, PT, DPT, ATC 11/09/22 5:02 PM

## 2022-11-10 NOTE — Therapy (Signed)
OUTPATIENT PHYSICAL THERAPY TREATMENT NOTE   Patient Name: Brenda Mcmahon MRN: [id number] DOB:[birth year], 32 y.o., female Today's Date: 11/14/2022  PCP: Blane Ohara, MD  REFERRING PROVIDER:  Lanell Persons, MD - 07/18/2022 Jene Every, MD - 09/21/2023 Dorothy Spark, PA-C - 10/30/2022    END OF SESSION:   PT End of Session - 11/13/22 1623     Visit Number 21    Number of Visits 27    Date for PT Re-Evaluation 12/04/22    Authorization Type WC    Authorization Time Period 2 x week until 8/9.    PT Start Time 1615    PT Stop Time 1700    PT Time Calculation (min) 45 min    Activity Tolerance Patient limited by pain    Behavior During Therapy [facility] for tasks assessed/performed                          Past Medical History:  Diagnosis Date   Palpitation    Past Surgical History:  Procedure Laterality Date   FOOT FRACTURE SURGERY Right 2015   also ankle fracture repaired.    WRIST FRACTURE SURGERY Left    32 yo.  fell through glass door.    Patient Active Problem List   Diagnosis Date Noted   Other insomnia 10/14/2022   Routine medical exam 09/18/2021   Need for DTaP vaccine 09/18/2021   Moderate recurrent major depression (HCC) 08/07/2021   GAD (generalized anxiety disorder) 08/07/2021   Encounter for other contraceptive management 08/07/2021   Palpitation 11/10/2020   Achilles bursitis of right lower extremity 03/14/2017   Recurrent major depression-severe (HCC) 03/14/2017    REFERRING DIAG: Y78.29FA (ICD-10-CM) - Contusion of other specified part of neck, subsequent encounter   M54.2 (ICD-10-CM) - Cervicalgia   THERAPY DIAG:  Cervicalgia  Chronic right shoulder pain  Cramp and spasm  Abnormal posture  Muscle weakness (generalized)  Rationale for Evaluation and Treatment Rehabilitation  PERTINENT HISTORY: Work-related injury on 06/30/22   PRECAUTIONS: none    SUBJECTIVE:                                                                                                                                                                                      SUBJECTIVE STATEMENT:  Patient reports she had to was working on four Albumin bags and she states by bag number two she was about in tears because the right side was hurting and she feels like she is back at square one.   PAIN:  Are you having pain? Yes:  NPRS scale: 8/10 Pain location: Right neck, shoulder, thoracic region Pain  description: sore Aggravating factors: Overhead reach, repetitive shoulder activity Relieving factors: Taping, stretching, needling   OBJECTIVE: (objective measures completed at initial evaluation unless otherwise dated) DIAGNOSTIC FINDINGS:  IMPRESSION: Negative cervical spine radiographs.   PATIENT SURVEYS:  FOTO 57% function to 66% predicted  09/21/2022: 47% 10/16/22: 63% function  11/06/2022: not assessed   POSTURE:  Rounded shoulders and forward head   PALPATION: Tautness and palpable tenderness Rt upper trap, cervical paraspinals, middle trap, levator scapulae, rhomboids             CERVICAL ROM:    Active ROM A/PROM (deg) eval 08/14/22 08/24/2022 08/31/22 10/06/22 11/06/2022  Flexion 42 pn 62 "pulling"    75 WFL  Extension 50 "sore" 50 "pulling"   70 WFL  Right lateral flexion 35 pn   50  WFL  Left lateral flexion 30 pn   50  WFL  Right rotation 80 "pull"   80 85 WFL  Left rotation 60 pn  65 65 75 WFL   (Blank rows = not tested)   UPPER EXTREMITY ROM:   Active ROM Right eval Left eval Right 09/21/2022 Right 09/27/22 Rt / Lt 10/12/22 10/16/22 Right Rt / Lt 11/06/2022 11/09/22 right  Shoulder flexion 145 "sore" Full  150 170 pain upper trap  Full  150 / 170 165  Shoulder extension            Shoulder abduction            Shoulder adduction            Shoulder extension            Shoulder internal rotation       T10 / T6     Shoulder external rotation            Elbow flexion            Elbow extension            Wrist  flexion            Wrist extension            Wrist ulnar deviation            Wrist radial deviation            Wrist pronation            Wrist supination             (Blank rows = not tested)   UPPER EXTREMITY MMT:   MMT Right eval Left eval Right 08/24/2022 10/16/22 Rt / Lt 11/06/2022  Shoulder flexion 4+ pn 5  5 bilateral   Shoulder extension         Shoulder abduction 5 5  5  bilateral   Shoulder adduction         Shoulder extension         Shoulder internal rotation         Shoulder external rotation         Middle trapezius 4+ pn 4+  4 4 bilateral 4 / 4  Lower trapezius 4- pn 4-  4- 4- bilateral 4- / 4-  Elbow flexion         Elbow extension         Wrist flexion         Wrist extension         Wrist ulnar deviation         Wrist radial deviation  Wrist pronation         Wrist supination         Grip strength          (Blank rows = not tested)   CERVICAL SPECIAL TESTS:  Cervical compression (-) Cervical distraction  (-)    FUNCTIONAL TESTS:  N/A   TODAY'S TREATMENT [facility] Adult PT Treatment:                                                DATE: 11/13/22 Manual Therapy: Skilled palpation and monitoring of muscle tension while performing TPDN STM for right cervical paraspinals, upper trap, posterior shoulder and rhomboid region Suboccipital release with gentle manual traction Cervical PROM and upper trap/levator scap stretching  Right GHJ and scapulothoracic mobs all directions Right shoulder PROM all directions K-tape for right upper trap, shoulder depression and retraction / postural correction Trigger Point Dry Needling Treatment: Pre-treatment instruction: Patient instructed on dry needling rationale, procedures, and possible side effects including pain during treatment (achy,cramping feeling), bruising, drop of blood, lightheadedness, nausea, sweating. Patient Consent Given: Yes Education handout provided: Previously provided Muscles treated: Right  upper trap Needle size and number: .30x59mm x 3 Electrical stimulation performed: No Parameters: N/A Treatment response/outcome: Twitch response elicited and Palpable decrease in muscle tension Post-treatment instructions: Patient instructed to expect possible mild to moderate muscle soreness later today and/or tomorrow. Patient instructed in methods to reduce muscle soreness and to continue prescribed HEP. If patient was dry needled over the lung field, patient was instructed on signs and symptoms of pneumothorax and, however unlikely, to see immediate medical attention should they occur. Patient was also educated on signs and symptoms of infection and to seek medical attention should they occur. Patient verbalized understanding of these instructions and education. Modalities: E-stim TENS premod to right upper trap and rhomboid region with MHP applied x 10 min Intensity to patient tolerance   [facility] Adult PT Treatment:                                                DATE: 11/09/22 Therapeutic Exercise: UBE level 2 x 4 minutes  90/90 position with horizontal shoulder abduction red band 2 x 10  Standing serratus punch attempted with black band, unable; no resistance 2 x 10  Neuromuscular re-ed: Shoulder elevation and abduction with mirror for visual feedback and heavy cues on postural correction and core engagement multiple reps   [facility] Adult PT Treatment:                                                DATE: 11/06/22 Therapeutic Exercise: Supine thoracic mobs with peanut ball combined with shoulder flexion at various levels of upper thoracic spine Sleeper stretch 3 x 30 sec on right Doorway pec stretch 3 x 30 sec Prone single arm T and Y with thumb up off edge of table x 10 each Supine serratus punch x 10 Manual Therapy: Skilled palpation and monitoring of muscle tension while performing TPDN Prone thoracic extension thrust manipulation Right GHJ and scapulothoracic mobs all directions Right  shoulder PROM  all directions Scap pinned shoulder stretching flexion and cross body K-tape for shoulder depression and retraction / postural correction Trigger Point Dry Needling Treatment: Pre-treatment instruction: Patient instructed on dry needling rationale, procedures, and possible side effects including pain during treatment (achy,cramping feeling), bruising, drop of blood, lightheadedness, nausea, sweating. Patient Consent Given: Yes Education handout provided: Previously provided Muscles treated: Right upper trap, rhomboid region, subscap, lat region Needle size and number: .30x38mm x 8 Electrical stimulation performed: No Parameters: N/A Treatment response/outcome: Twitch response elicited and Palpable decrease in muscle tension Post-treatment instructions: Patient instructed to expect possible mild to moderate muscle soreness later today and/or tomorrow. Patient instructed in methods to reduce muscle soreness and to continue prescribed HEP. If patient was dry needled over the lung field, patient was instructed on signs and symptoms of pneumothorax and, however unlikely, to see immediate medical attention should they occur. Patient was also educated on signs and symptoms of infection and to seek medical attention should they occur. Patient verbalized understanding of these instructions and education. Self care: Instruction and demonstration of postural brace  [facility] Adult PT Treatment:                                                DATE: 10/16/22 Therapeutic Exercise: 90/90 IR x 5 Scapular clock to 90 degrees x 10  Single arm row 2 x 10 yellow band Resisted shoulder flexion 1# 2 x 10  Manual Therapy: K-tape for shoulder retraction/depression and upper trap  Therapeutic Activity: Re-assessment to determine overall progress, educating patient on progress towards goals.   PATIENT EDUCATION:  Education details: HEP, TPDN Person educated: Patient Education method: Explanation Education  comprehension: verbalized understanding   HOME EXERCISE PROGRAM: Access Code: [email or web address]    ASSESSMENT: CLINICAL IMPRESSION: Patient arrives to therapy reporting increased right sided neck, shoulder, and thoracic pain from work earlier today. Performed TPDN for right upper trap region and gentle manual stretching and STM to reduce muscle tension and pain. Also utilized e-stim and MHP for pain modulation and reducing muscle tension. She exhibits increased muscular tension and tenderness of generalized right cervical, shoulder, and thoracic region and was limited in performing any active therapy due to pain. She had multiple instances of becoming tearful due to pain and frustration with regression in her symptoms and feeling of being back to where she started. She did report a slight improvement in symptoms following therapy but remains with increased pain and muscle tension. No changes to HEP this visit. She would benefit from continued skilled PT to progress her mobility and strength in order to reduce pain and maximize functional ability with work related tasks.   OBJECTIVE IMPAIRMENTS: decreased activity tolerance, decreased knowledge of condition, decreased mobility, decreased ROM, decreased strength, increased fascial restrictions, impaired flexibility, impaired UE functional use, improper body mechanics, postural dysfunction, and pain.    ACTIVITY LIMITATIONS: carrying, lifting, and reach over head   PARTICIPATION LIMITATIONS: meal prep, cleaning, laundry, driving, shopping, occupation, and yard work   PERSONAL FACTORS: Age, Profession, and Time since onset of injury/illness/exacerbation are also affecting patient's functional outcome.      GOALS: Goals reviewed with patient? Yes   SHORT TERM GOALS: Target date: 08/21/2022   Patient will be independent and compliant with initial HEP.    Baseline: issued at eval  Goal status: met   2.  Patient will demonstrate  at least 70 degrees of Lt  cervical rotation AROM to improve ability to complete head turns while driving.  Baseline: see above  08/24/2022: 64 deg 08/31/22: see flow sheets Goal status: met   3.  Patient will demonstrate at least 50 degrees of cervical flexion AROM to improve tolerance to reading and work tasks.  Baseline: see above  Goal status: met   LONG TERM GOALS: Target date: 12/04/2022   Patient will demonstrate at least 160 degrees of Rt shoulder flexion AROM to improve ability to reach overhead at work.  Baseline: see above  09/21/2022: 150 deg 11/06/2022: 150 deg Goal status: ONGOING   2.  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  Baseline: unable  09/21/2022: patient reports continued limitation with reaching overhead 10/16/22: 45 seconds 11/06/2022: patient reports continued limitation Goal status: ONGOING   3.  Patient will score at least 66% on FOTO to signify clinically meaningful improvement in functional abilities.  Baseline: see above  09/21/2022: 47% 11/06/2022: not assessed Goal status: ONGOING   4.  Patient will report pain as </=2/10 to reduce her current functional limitations.  Baseline: see above  09/21/2022: 7/10 10/16/22: at worst 6/10  11/06/2022: 4/10 Goal status: ONGOING  5. Patient will demonstrate 5/5 middle trap strength to improve ability to complete pulling activity at work.    Baseline: see above  11/06/2022: limitations noted above  Goal status: ONGOING     PLAN: PT FREQUENCY: 2x/week   PT DURATION: 4 weeks   PLANNED INTERVENTIONS: Therapeutic exercises, Therapeutic activity, Neuromuscular re-education, Patient/Family education, Self Care, Joint mobilization, Dry Needling, Spinal manipulation, Spinal mobilization, Cryotherapy, Moist heat, Traction, Manual therapy, and Re-evaluation   PLAN FOR NEXT SESSION: Review and progress HEP PRN, manual/TPDN to cervical  and right shoulder region, cervical/thoracic/shoulder mobility, postural strengthening   Rosana Hoes, PT, DPT, LAT, ATC 11/14/22  7:53 AM Phone: [phone] Fax: [phone]

## 2022-11-11 NOTE — Assessment & Plan Note (Signed)
Increase Pristiq to 50 mg daily. Continue counseling.

## 2022-11-11 NOTE — Assessment & Plan Note (Signed)
The current medical regimen is effective;  continue present plan and medication.  Encourage to made an appointment with counselor.

## 2022-11-12 ENCOUNTER — Encounter: Payer: Self-pay | Admitting: Family Medicine

## 2022-11-13 ENCOUNTER — Other Ambulatory Visit: Payer: Self-pay

## 2022-11-13 ENCOUNTER — Telehealth: Payer: Self-pay | Admitting: Family Medicine

## 2022-11-13 ENCOUNTER — Encounter: Payer: Self-pay | Admitting: Physical Therapy

## 2022-11-13 ENCOUNTER — Ambulatory Visit: Payer: PRIVATE HEALTH INSURANCE | Admitting: Physical Therapy

## 2022-11-13 DIAGNOSIS — M542 Cervicalgia: Secondary | ICD-10-CM | POA: Diagnosis not present

## 2022-11-13 DIAGNOSIS — G8929 Other chronic pain: Secondary | ICD-10-CM

## 2022-11-13 DIAGNOSIS — R252 Cramp and spasm: Secondary | ICD-10-CM

## 2022-11-13 DIAGNOSIS — R293 Abnormal posture: Secondary | ICD-10-CM

## 2022-11-13 DIAGNOSIS — M6281 Muscle weakness (generalized): Secondary | ICD-10-CM

## 2022-11-13 NOTE — Telephone Encounter (Signed)
FMLA -[employer]/ [place]

## 2022-11-15 ENCOUNTER — Ambulatory Visit: Payer: PRIVATE HEALTH INSURANCE

## 2022-11-15 DIAGNOSIS — R293 Abnormal posture: Secondary | ICD-10-CM

## 2022-11-15 DIAGNOSIS — G8929 Other chronic pain: Secondary | ICD-10-CM

## 2022-11-15 DIAGNOSIS — M542 Cervicalgia: Secondary | ICD-10-CM | POA: Diagnosis not present

## 2022-11-15 DIAGNOSIS — M6281 Muscle weakness (generalized): Secondary | ICD-10-CM

## 2022-11-15 DIAGNOSIS — R252 Cramp and spasm: Secondary | ICD-10-CM

## 2022-11-15 NOTE — Therapy (Signed)
OUTPATIENT PHYSICAL THERAPY TREATMENT NOTE   Patient Name: Brenda Mcmahon MRN: [id number] DOB:1990/07/09, 32 y.o., female Today's Date: 11/16/2022  PCP: Blane Ohara, MD  REFERRING PROVIDER:  Lanell Persons, MD - 07/18/2022 Jene Every, MD - 09/21/2023 Dorothy Spark, PA-C - 10/30/2022    END OF SESSION:   PT End of Session - 11/15/22 1615     Visit Number 22    Number of Visits 27    Date for PT Re-Evaluation 12/04/22    Authorization Type WC    Authorization Time Period 2 x week until 8/9.    PT Start Time 1615    PT Stop Time 1656    PT Time Calculation (min) 41 min    Activity Tolerance Patient limited by pain    Behavior During Therapy [facility] for tasks assessed/performed                           Past Medical History:  Diagnosis Date   Palpitation    Past Surgical History:  Procedure Laterality Date   FOOT FRACTURE SURGERY Right 2015   also ankle fracture repaired.    WRIST FRACTURE SURGERY Left    32 yo.  fell through glass door.    Patient Active Problem List   Diagnosis Date Noted   Other insomnia 10/14/2022   Routine medical exam 09/18/2021   Need for DTaP vaccine 09/18/2021   Moderate recurrent major depression (HCC) 08/07/2021   GAD (generalized anxiety disorder) 08/07/2021   Encounter for other contraceptive management 08/07/2021   Palpitation 11/10/2020   Achilles bursitis of right lower extremity 03/14/2017   Recurrent major depression-severe (HCC) 03/14/2017    REFERRING DIAG: W09.81XB (ICD-10-CM) - Contusion of other specified part of neck, subsequent encounter   M54.2 (ICD-10-CM) - Cervicalgia   THERAPY DIAG:  Cervicalgia  Chronic right shoulder pain  Cramp and spasm  Abnormal posture  Muscle weakness (generalized)  Rationale for Evaluation and Treatment Rehabilitation  PERTINENT HISTORY: Work-related injury on 06/30/22   PRECAUTIONS: none    SUBJECTIVE:                                                                                                                                                                                      SUBJECTIVE STATEMENT:  "If I could have called out of work today I would have. I took something for pain."  PAIN:  Are you having pain? Yes:  NPRS scale: 9/10 Pain location: Right upper trap  Pain description: sore Aggravating factors: Overhead reach, repetitive shoulder activity Relieving factors: Taping, stretching, needling   OBJECTIVE: (objective measures completed at initial evaluation  unless otherwise dated) DIAGNOSTIC FINDINGS:  IMPRESSION: Negative cervical spine radiographs.   PATIENT SURVEYS:  FOTO 57% function to 66% predicted  09/21/2022: 47% 10/16/22: 63% function  11/06/2022: not assessed   POSTURE:  Rounded shoulders and forward head   PALPATION: Tautness and palpable tenderness Rt upper trap, cervical paraspinals, middle trap, levator scapulae, rhomboids             CERVICAL ROM:    Active ROM A/PROM (deg) eval 08/14/22 08/24/2022 08/31/22 10/06/22 11/06/2022  Flexion 42 pn 62 "pulling"    75 WFL  Extension 50 "sore" 50 "pulling"   70 WFL  Right lateral flexion 35 pn   50  WFL  Left lateral flexion 30 pn   50  WFL  Right rotation 80 "pull"   80 85 WFL  Left rotation 60 pn  65 65 75 WFL   (Blank rows = not tested)   UPPER EXTREMITY ROM:   Active ROM Right eval Left eval Right 09/21/2022 Right 09/27/22 Rt / Lt 10/12/22 10/16/22 Right Rt / Lt 11/06/2022 11/09/22 right  Shoulder flexion 145 "sore" Full  150 170 pain upper trap  Full  150 / 170 165  Shoulder extension            Shoulder abduction            Shoulder adduction            Shoulder extension            Shoulder internal rotation       T10 / T6     Shoulder external rotation            Elbow flexion            Elbow extension            Wrist flexion            Wrist extension            Wrist ulnar deviation            Wrist radial deviation            Wrist pronation             Wrist supination             (Blank rows = not tested)   UPPER EXTREMITY MMT:   MMT Right eval Left eval Right 08/24/2022 10/16/22 Rt / Lt 11/06/2022  Shoulder flexion 4+ pn 5  5 bilateral   Shoulder extension         Shoulder abduction 5 5  5  bilateral   Shoulder adduction         Shoulder extension         Shoulder internal rotation         Shoulder external rotation         Middle trapezius 4+ pn 4+  4 4 bilateral 4 / 4  Lower trapezius 4- pn 4-  4- 4- bilateral 4- / 4-  Elbow flexion         Elbow extension         Wrist flexion         Wrist extension         Wrist ulnar deviation         Wrist radial deviation         Wrist pronation         Wrist supination         Grip strength          (  Blank rows = not tested)   CERVICAL SPECIAL TESTS:  Cervical compression (-) Cervical distraction  (-)    FUNCTIONAL TESTS:  N/A   TODAY'S TREATMENT [facility] Adult PT Treatment:                                                DATE: 11/15/22 Therapeutic Exercise: Supine scapular retraction 2 x 10  Supine shoulder AAROM with dowel 2 x 5 Supine cervical retraction 2 x 10  Manual Therapy: STM Rt upper trap, levator scapulae, rhomboids Rt shoulder PROM to tolerance Rt shoulder GHJ oscillations     [facility] Adult PT Treatment:                                                DATE: 11/13/22 Manual Therapy: Skilled palpation and monitoring of muscle tension while performing TPDN STM for right cervical paraspinals, upper trap, posterior shoulder and rhomboid region Suboccipital release with gentle manual traction Cervical PROM and upper trap/levator scap stretching  Right GHJ and scapulothoracic mobs all directions Right shoulder PROM all directions K-tape for right upper trap, shoulder depression and retraction / postural correction Trigger Point Dry Needling Treatment: Pre-treatment instruction: Patient instructed on dry needling rationale, procedures, and possible side effects  including pain during treatment (achy,cramping feeling), bruising, drop of blood, lightheadedness, nausea, sweating. Patient Consent Given: Yes Education handout provided: Previously provided Muscles treated: Right upper trap Needle size and number: .30x40mm x 3 Electrical stimulation performed: No Parameters: N/A Treatment response/outcome: Twitch response elicited and Palpable decrease in muscle tension Post-treatment instructions: Patient instructed to expect possible mild to moderate muscle soreness later today and/or tomorrow. Patient instructed in methods to reduce muscle soreness and to continue prescribed HEP. If patient was dry needled over the lung field, patient was instructed on signs and symptoms of pneumothorax and, however unlikely, to see immediate medical attention should they occur. Patient was also educated on signs and symptoms of infection and to seek medical attention should they occur. Patient verbalized understanding of these instructions and education. Modalities: E-stim TENS premod to right upper trap and rhomboid region with MHP applied x 10 min Intensity to patient tolerance   [facility] Adult PT Treatment:                                                DATE: 11/09/22 Therapeutic Exercise: UBE level 2 x 4 minutes  90/90 position with horizontal shoulder abduction red band 2 x 10  Standing serratus punch attempted with black band, unable; no resistance 2 x 10  Neuromuscular re-ed: Shoulder elevation and abduction with mirror for visual feedback and heavy cues on postural correction and core engagement multiple reps   [facility] Adult PT Treatment:                                                DATE: 11/06/22 Therapeutic Exercise: Supine thoracic mobs with peanut ball combined with shoulder flexion at various levels  of upper thoracic spine Sleeper stretch 3 x 30 sec on right Doorway pec stretch 3 x 30 sec Prone single arm T and Y with thumb up off edge of table x 10  each Supine serratus punch x 10 Manual Therapy: Skilled palpation and monitoring of muscle tension while performing TPDN Prone thoracic extension thrust manipulation Right GHJ and scapulothoracic mobs all directions Right shoulder PROM all directions Scap pinned shoulder stretching flexion and cross body K-tape for shoulder depression and retraction / postural correction Trigger Point Dry Needling Treatment: Pre-treatment instruction: Patient instructed on dry needling rationale, procedures, and possible side effects including pain during treatment (achy,cramping feeling), bruising, drop of blood, lightheadedness, nausea, sweating. Patient Consent Given: Yes Education handout provided: Previously provided Muscles treated: Right upper trap, rhomboid region, subscap, lat region Needle size and number: .30x36mm x 8 Electrical stimulation performed: No Parameters: N/A Treatment response/outcome: Twitch response elicited and Palpable decrease in muscle tension Post-treatment instructions: Patient instructed to expect possible mild to moderate muscle soreness later today and/or tomorrow. Patient instructed in methods to reduce muscle soreness and to continue prescribed HEP. If patient was dry needled over the lung field, patient was instructed on signs and symptoms of pneumothorax and, however unlikely, to see immediate medical attention should they occur. Patient was also educated on signs and symptoms of infection and to seek medical attention should they occur. Patient verbalized understanding of these instructions and education. 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 She has fair tolerance to manual therapy as she has increased sensitivity to palpation of the upper trap and only tolerates minimal passive range shoulder abduction and flexion due to pain. Completed gentle postural and shoulder AAROM activity with fair tolerance.    OBJECTIVE IMPAIRMENTS: decreased activity tolerance, decreased knowledge of condition, decreased mobility, decreased ROM, decreased strength, increased fascial restrictions, impaired flexibility, impaired UE functional use, improper body mechanics, postural dysfunction, and pain.    ACTIVITY LIMITATIONS: carrying, lifting, and reach over head   PARTICIPATION LIMITATIONS: meal prep, cleaning, laundry, driving, shopping, occupation, and yard work   PERSONAL FACTORS: Age, Profession, and Time since onset of injury/illness/exacerbation are also affecting patient's functional outcome.      GOALS: Goals reviewed with patient? Yes   SHORT TERM GOALS: Target date: 08/21/2022   Patient will be independent and compliant with initial HEP.    Baseline: issued at eval  Goal status: met   2.  Patient will demonstrate at least 70 degrees of Lt cervical rotation AROM to improve ability to complete head turns while driving.  Baseline: see above  08/24/2022: 64 deg 08/31/22: see flow sheets Goal status: met   3.  Patient will demonstrate at least 50 degrees of cervical flexion AROM to improve tolerance to reading and work tasks.  Baseline: see above  Goal status: met   LONG TERM GOALS: Target date: 12/04/2022   Patient will demonstrate at least 160 degrees of Rt shoulder flexion AROM to improve ability to reach overhead at work.  Baseline: see above  09/21/2022: 150 deg 11/06/2022: 150 deg Goal status: ONGOING   2.  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  Baseline:  unable  09/21/2022: patient reports continued limitation with reaching overhead 10/16/22: 45 seconds 11/06/2022: patient reports continued limitation Goal status: ONGOING   3.  Patient will score at least 66% on FOTO to signify clinically meaningful improvement in functional abilities.  Baseline: see above  09/21/2022: 47% 11/06/2022: not assessed Goal status: ONGOING   4.  Patient will report pain as </=2/10 to reduce her current functional limitations.  Baseline: see above  09/21/2022: 7/10 10/16/22: at worst 6/10  11/06/2022: 4/10 Goal status: ONGOING  5. Patient will demonstrate 5/5 middle trap strength to improve ability to complete pulling activity at work.    Baseline: see above  11/06/2022: limitations noted above  Goal status: ONGOING     PLAN: PT FREQUENCY: 2x/week   PT DURATION: 4 weeks   PLANNED INTERVENTIONS: Therapeutic exercises, Therapeutic activity, Neuromuscular re-education, Patient/Family education, Self Care, Joint mobilization, Dry Needling, Spinal manipulation, Spinal mobilization, Cryotherapy, Moist heat, Traction, Manual therapy, and Re-evaluation   PLAN FOR NEXT SESSION: Review and progress HEP PRN, manual/TPDN to cervical and right shoulder region, cervical/thoracic/shoulder mobility, postural strengthening  Letitia Libra, PT, DPT, ATC 11/16/22 8:31 AM

## 2022-11-20 ENCOUNTER — Other Ambulatory Visit: Payer: Self-pay

## 2022-11-20 ENCOUNTER — Ambulatory Visit: Payer: PRIVATE HEALTH INSURANCE | Attending: Orthopedic Surgery | Admitting: Physical Therapy

## 2022-11-20 ENCOUNTER — Encounter: Payer: Self-pay | Admitting: Physical Therapy

## 2022-11-20 DIAGNOSIS — M25511 Pain in right shoulder: Secondary | ICD-10-CM | POA: Diagnosis present

## 2022-11-20 DIAGNOSIS — R252 Cramp and spasm: Secondary | ICD-10-CM | POA: Diagnosis present

## 2022-11-20 DIAGNOSIS — M6281 Muscle weakness (generalized): Secondary | ICD-10-CM | POA: Diagnosis present

## 2022-11-20 DIAGNOSIS — M542 Cervicalgia: Secondary | ICD-10-CM | POA: Insufficient documentation

## 2022-11-20 DIAGNOSIS — G8929 Other chronic pain: Secondary | ICD-10-CM | POA: Insufficient documentation

## 2022-11-20 DIAGNOSIS — R293 Abnormal posture: Secondary | ICD-10-CM | POA: Diagnosis present

## 2022-11-20 NOTE — Therapy (Unsigned)
OUTPATIENT PHYSICAL THERAPY TREATMENT NOTE   Patient Name: Brenda Mcmahon MRN: [id number] DOB:[birth year], 32 y.o., female Today's Date: 11/21/2022  PCP: Blane Ohara, MD  REFERRING PROVIDER:  Lanell Persons, MD - 07/18/2022 Jene Every, MD - 09/21/2023 Dorothy Spark, PA-C - 10/30/2022    END OF SESSION:   PT End of Session - 11/20/22 1628     Visit Number 23    Number of Visits 27    Date for PT Re-Evaluation 12/04/22    Authorization Type WC    Authorization Time Period 2 x week until 8/9.    PT Start Time 1615    PT Stop Time 1700    PT Time Calculation (min) 45 min    Activity Tolerance Patient limited by pain    Behavior During Therapy [facility] for tasks assessed/performed                            Past Medical History:  Diagnosis Date   Palpitation    Past Surgical History:  Procedure Laterality Date   FOOT FRACTURE SURGERY Right 2015   also ankle fracture repaired.    WRIST FRACTURE SURGERY Left    32 yo.  fell through glass door.    Patient Active Problem List   Diagnosis Date Noted   Other insomnia 10/14/2022   Routine medical exam 09/18/2021   Need for DTaP vaccine 09/18/2021   Moderate recurrent major depression (HCC) 08/07/2021   GAD (generalized anxiety disorder) 08/07/2021   Encounter for other contraceptive management 08/07/2021   Palpitation 11/10/2020   Achilles bursitis of right lower extremity 03/14/2017   Recurrent major depression-severe (HCC) 03/14/2017    REFERRING DIAG: G64.40HK (ICD-10-CM) - Contusion of other specified part of neck, subsequent encounter   M54.2 (ICD-10-CM) - Cervicalgia   THERAPY DIAG:  Cervicalgia  Chronic right shoulder pain  Cramp and spasm  Abnormal posture  Muscle weakness (generalized)  Rationale for Evaluation and Treatment Rehabilitation  PERTINENT HISTORY: Work-related injury on 06/30/22   PRECAUTIONS: none    SUBJECTIVE:                                                                                                                                                                                      SUBJECTIVE STATEMENT:  Patient reports primarily right shoulder pain that is more on the front/top of the shoulder. She continues to have difficulty with any activity involving the right shoulder, and feels very limited at work.  PAIN:  Are you having pain? Yes:  NPRS scale: 8/10 Pain location: Right shoulder Pain description: sore Aggravating factors: Overhead reach, repetitive shoulder  activity Relieving factors: Taping, stretching, needling   OBJECTIVE: (objective measures completed at initial evaluation unless otherwise dated) DIAGNOSTIC FINDINGS:  IMPRESSION: Negative cervical spine radiographs.   PATIENT SURVEYS:  FOTO 57% function to 66% predicted  09/21/2022: 47% 10/16/22: 63% function  11/06/2022: not assessed   POSTURE:  Rounded shoulders and forward head   PALPATION: Tautness and palpable tenderness Rt upper trap, cervical paraspinals, middle trap, levator scapulae, rhomboids             CERVICAL ROM:    Active ROM A/PROM (deg) eval 08/14/22 08/24/2022 08/31/22 10/06/22 11/06/2022  Flexion 42 pn 62 "pulling"    75 WFL  Extension 50 "sore" 50 "pulling"   70 WFL  Right lateral flexion 35 pn   50  WFL  Left lateral flexion 30 pn   50  WFL  Right rotation 80 "pull"   80 85 WFL  Left rotation 60 pn  65 65 75 WFL   (Blank rows = not tested)   UPPER EXTREMITY ROM:   Active ROM Right eval Left eval Right 09/21/2022 Right 09/27/22 Rt / Lt 10/12/22 10/16/22 Right Rt / Lt 11/06/2022 11/09/22 right  Shoulder flexion 145 "sore" Full  150 170 pain upper trap  Full  150 / 170 165  Shoulder extension            Shoulder abduction            Shoulder adduction            Shoulder extension            Shoulder internal rotation       T10 / T6     Shoulder external rotation            Elbow flexion            Elbow extension            Wrist flexion             Wrist extension            Wrist ulnar deviation            Wrist radial deviation            Wrist pronation            Wrist supination             (Blank rows = not tested)   UPPER EXTREMITY MMT:   MMT Right eval Left eval Right 08/24/2022 10/16/22 Rt / Lt 11/06/2022  Shoulder flexion 4+ pn 5  5 bilateral   Shoulder extension         Shoulder abduction 5 5  5  bilateral   Shoulder adduction         Shoulder extension         Shoulder internal rotation         Shoulder external rotation         Middle trapezius 4+ pn 4+  4 4 bilateral 4 / 4  Lower trapezius 4- pn 4-  4- 4- bilateral 4- / 4-  Elbow flexion         Elbow extension         Wrist flexion         Wrist extension         Wrist ulnar deviation         Wrist radial deviation         Wrist pronation  Wrist supination         Grip strength          (Blank rows = not tested)   CERVICAL SPECIAL TESTS:  Cervical compression (-) Cervical distraction  (-)    FUNCTIONAL TESTS:  N/A   TODAY'S TREATMENT [facility] Adult PT Treatment:                                                DATE: 11/20/22 Therapeutic Exercise: UBE L1 x 4 min (fwd/bwd) while taking subjective Seated upper trap and levator scap stretch 2 x 15 sec each on right Supine dowel shoulder flexion x 10 Supine serratus punch 2 x 10 Sidelying shoulder ER 2 x 10 Sidelying shoulder abduction 2 x 10 Seated shoulder blade squeeze postural correction x 10 Manual Therapy: Skilled palpation and monitoring of muscle tension while performing TPDN STM for right deltoid Right GHJ and scapulothoracic mobs all directions Right shoulder PROM all directions K-tape for right deltoid, upper trap, shoulder depression and retraction / postural correction Trigger Point Dry Needling Treatment: Pre-treatment instruction: Patient instructed on dry needling rationale, procedures, and possible side effects including pain during treatment (achy,cramping feeling), bruising,  drop of blood, lightheadedness, nausea, sweating. Patient Consent Given: Yes Education handout provided: Previously provided Muscles treated: Right deltoid (all heads) Needle size and number: .30x29mm x 5 Electrical stimulation performed: No Parameters: N/A Treatment response/outcome: Twitch response elicited and Palpable decrease in muscle tension Post-treatment instructions: Patient instructed to expect possible mild to moderate muscle soreness later today and/or tomorrow. Patient instructed in methods to reduce muscle soreness and to continue prescribed HEP. If patient was dry needled over the lung field, patient was instructed on signs and symptoms of pneumothorax and, however unlikely, to see immediate medical attention should they occur. Patient was also educated on signs and symptoms of infection and to seek medical attention should they occur. Patient verbalized understanding of these instructions and education.   [facility] Adult PT Treatment:                                                DATE: 11/15/22 Therapeutic Exercise: Supine scapular retraction 2 x 10  Supine shoulder AAROM with dowel 2 x 5 Supine cervical retraction 2 x 10  Manual Therapy: STM Rt upper trap, levator scapulae, rhomboids Rt shoulder PROM to tolerance Rt shoulder GHJ oscillations   [facility] Adult PT Treatment:                                                DATE: 11/13/22 Manual Therapy: Skilled palpation and monitoring of muscle tension while performing TPDN STM for right cervical paraspinals, upper trap, posterior shoulder and rhomboid region Suboccipital release with gentle manual traction Cervical PROM and upper trap/levator scap stretching  Right GHJ and scapulothoracic mobs all directions Right shoulder PROM all directions K-tape for right upper trap, shoulder depression and retraction / postural correction Trigger Point Dry Needling Treatment: Pre-treatment instruction: Patient instructed on dry needling  rationale, procedures, and possible side effects including pain during treatment (achy,cramping feeling), bruising, drop of blood, lightheadedness,  nausea, sweating. Patient Consent Given: Yes Education handout provided: Previously provided Muscles treated: Right upper trap Needle size and number: .30x69mm x 3 Electrical stimulation performed: No Parameters: N/A Treatment response/outcome: Twitch response elicited and Palpable decrease in muscle tension Post-treatment instructions: Patient instructed to expect possible mild to moderate muscle soreness later today and/or tomorrow. Patient instructed in methods to reduce muscle soreness and to continue prescribed HEP. If patient was dry needled over the lung field, patient was instructed on signs and symptoms of pneumothorax and, however unlikely, to see immediate medical attention should they occur. Patient was also educated on signs and symptoms of infection and to seek medical attention should they occur. Patient verbalized understanding of these instructions and education. Modalities: E-stim TENS premod to right upper trap and rhomboid region with MHP applied x 10 min Intensity to patient tolerance  [facility] Adult PT Treatment:                                                DATE: 11/09/22 Therapeutic Exercise: UBE level 2 x 4 minutes  90/90 position with horizontal shoulder abduction red band 2 x 10  Standing serratus punch attempted with black band, unable; no resistance 2 x 10  Neuromuscular re-ed: Shoulder elevation and abduction with mirror for visual feedback and heavy cues on postural correction and core engagement multiple reps   [facility] Adult PT Treatment:                                                DATE: 11/06/22 Therapeutic Exercise: Supine thoracic mobs with peanut ball combined with shoulder flexion at various levels of upper thoracic spine Sleeper stretch 3 x 30 sec on right Doorway pec stretch 3 x 30 sec Prone single arm T and Y  with thumb up off edge of table x 10 each Supine serratus punch x 10 Manual Therapy: Skilled palpation and monitoring of muscle tension while performing TPDN Prone thoracic extension thrust manipulation Right GHJ and scapulothoracic mobs all directions Right shoulder PROM all directions Scap pinned shoulder stretching flexion and cross body K-tape for shoulder depression and retraction / postural correction Trigger Point Dry Needling Treatment: Pre-treatment instruction: Patient instructed on dry needling rationale, procedures, and possible side effects including pain during treatment (achy,cramping feeling), bruising, drop of blood, lightheadedness, nausea, sweating. Patient Consent Given: Yes Education handout provided: Previously provided Muscles treated: Right upper trap, rhomboid region, subscap, lat region Needle size and number: .30x40mm x 8 Electrical stimulation performed: No Parameters: N/A Treatment response/outcome: Twitch response elicited and Palpable decrease in muscle tension Post-treatment instructions: Patient instructed to expect possible mild to moderate muscle soreness later today and/or tomorrow. Patient instructed in methods to reduce muscle soreness and to continue prescribed HEP. If patient was dry needled over the lung field, patient was instructed on signs and symptoms of pneumothorax and, however unlikely, to see immediate medical attention should they occur. Patient was also educated on signs and symptoms of infection and to seek medical attention should they occur. Patient verbalized understanding of these instructions and education. Self care: Instruction and demonstration of postural brace  PATIENT EDUCATION:  Education details: HEP, TPDN Person educated: Patient Education method: Explanation Education comprehension: Verbalized understanding   HOME  EXERCISE PROGRAM: Access Code: [email or web address]    ASSESSMENT: CLINICAL IMPRESSION: Patient with fair tolerance  for therapy, no adverse effects reported with therapy. She reports more anterior shoulder pain this visit that seems to be related to more deltoid tension and pain, so performed TPDN for the right deltoid region with multiple twitch responses. She does continue to exhibit limitations and pain with right shoulder and neck movement with increased muscular tension and tenderness throughout the right shoulder, neck, and periscapular region. Therex performed for light scapular control exercise and postural correction. Continued with taping as she reports this has been more beneficial compared to posture brace. No changes made to HEP this visit. She would benefit from continued skilled PT to progress her mobility and strength in order to reduce pain and maximize functional ability with work related tasks.    OBJECTIVE IMPAIRMENTS: decreased activity tolerance, decreased knowledge of condition, decreased mobility, decreased ROM, decreased strength, increased fascial restrictions, impaired flexibility, impaired UE functional use, improper body mechanics, postural dysfunction, and pain.    ACTIVITY LIMITATIONS: carrying, lifting, and reach over head   PARTICIPATION LIMITATIONS: meal prep, cleaning, laundry, driving, shopping, occupation, and yard work   PERSONAL FACTORS: Age, Profession, and Time since onset of injury/illness/exacerbation are also affecting patient's functional outcome.      GOALS: Goals reviewed with patient? Yes   SHORT TERM GOALS: Target date: 08/21/2022   Patient will be independent and compliant with initial HEP.    Baseline: issued at eval  Goal status: met   2.  Patient will demonstrate at least 70 degrees of Lt cervical rotation AROM to improve ability to complete head turns while driving.  Baseline: see above  08/24/2022: 64 deg 08/31/22: see flow sheets Goal status: met   3.  Patient will demonstrate at least 50 degrees of cervical flexion AROM to improve tolerance to reading  and work tasks.  Baseline: see above  Goal status: met   LONG TERM GOALS: Target date: 12/04/2022   Patient will demonstrate at least 160 degrees of Rt shoulder flexion AROM to improve ability to reach overhead at work.  Baseline: see above  09/21/2022: 150 deg 11/06/2022: 150 deg Goal status: ONGOING   2.  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  Baseline: unable  09/21/2022: patient reports continued limitation with reaching overhead 10/16/22: 45 seconds 11/06/2022: patient reports continued limitation Goal status: ONGOING   3.  Patient will score at least 66% on FOTO to signify clinically meaningful improvement in functional abilities.  Baseline: see above  09/21/2022: 47% 11/06/2022: not assessed Goal status: ONGOING   4.  Patient will report pain as </=2/10 to reduce her current functional limitations.  Baseline: see above  09/21/2022: 7/10 10/16/22: at worst 6/10  11/06/2022: 4/10 Goal status: ONGOING  5. Patient will demonstrate 5/5 middle trap strength to improve ability to complete pulling activity at work.    Baseline: see above  11/06/2022: limitations noted above  Goal status: ONGOING     PLAN: PT FREQUENCY: 2x/week   PT DURATION: 4 weeks   PLANNED INTERVENTIONS: Therapeutic exercises, Therapeutic activity, Neuromuscular re-education, Patient/Family education, Self Care, Joint mobilization, Dry Needling, Spinal manipulation, Spinal mobilization, Cryotherapy, Moist heat, Traction, Manual therapy, and Re-evaluation   PLAN FOR NEXT SESSION: Review and progress HEP PRN, manual/TPDN to cervical and right shoulder region, cervical/thoracic/shoulder mobility, postural strengthening   Rosana Hoes, PT, DPT,  LAT, ATC 11/21/22  8:01 AM Phone: [phone] Fax: [phone]

## 2022-11-21 DIAGNOSIS — Z0279 Encounter for issue of other medical certificate: Secondary | ICD-10-CM

## 2022-11-22 ENCOUNTER — Ambulatory Visit: Payer: PRIVATE HEALTH INSURANCE | Admitting: Physical Therapy

## 2022-11-22 ENCOUNTER — Encounter: Payer: Self-pay | Admitting: Physical Therapy

## 2022-11-22 ENCOUNTER — Other Ambulatory Visit: Payer: Self-pay

## 2022-11-22 DIAGNOSIS — G8929 Other chronic pain: Secondary | ICD-10-CM

## 2022-11-22 DIAGNOSIS — R293 Abnormal posture: Secondary | ICD-10-CM

## 2022-11-22 DIAGNOSIS — M542 Cervicalgia: Secondary | ICD-10-CM

## 2022-11-22 DIAGNOSIS — M6281 Muscle weakness (generalized): Secondary | ICD-10-CM

## 2022-11-22 DIAGNOSIS — R252 Cramp and spasm: Secondary | ICD-10-CM

## 2022-11-22 NOTE — Therapy (Addendum)
OUTPATIENT PHYSICAL THERAPY TREATMENT NOTE  DISCHARGE   Patient Name: Brenda Mcmahon MRN: [id number] DOB:[birth year], 32 y.o., female Today's Date: 11/22/2022  PCP: Blane Ohara, MD  REFERRING PROVIDER:  Lanell Persons, MD - 07/18/2022 Jene Every, MD - 09/21/2023 Dorothy Spark, PA-C - 10/30/2022    END OF SESSION:   PT End of Session - 11/22/22 1648     Visit Number 24    Number of Visits 27    Date for PT Re-Evaluation 12/04/22    Authorization Type WC    Authorization Time Period 2 x week until 8/9    PT Start Time 1600    PT Stop Time 1650    PT Time Calculation (min) 50 min    Activity Tolerance Patient limited by pain    Behavior During Therapy [facility] for tasks assessed/performed                             Past Medical History:  Diagnosis Date   Palpitation    Past Surgical History:  Procedure Laterality Date   FOOT FRACTURE SURGERY Right 2015   also ankle fracture repaired.    WRIST FRACTURE SURGERY Left    32 yo.  fell through glass door.    Patient Active Problem List   Diagnosis Date Noted   Other insomnia 10/14/2022   Routine medical exam 09/18/2021   Need for DTaP vaccine 09/18/2021   Moderate recurrent major depression (HCC) 08/07/2021   GAD (generalized anxiety disorder) 08/07/2021   Encounter for other contraceptive management 08/07/2021   Palpitation 11/10/2020   Achilles bursitis of right lower extremity 03/14/2017   Recurrent major depression-severe (HCC) 03/14/2017    REFERRING DIAG: W09.81XB (ICD-10-CM) - Contusion of other specified part of neck, subsequent encounter   M54.2 (ICD-10-CM) - Cervicalgia   THERAPY DIAG:  Chronic right shoulder pain  Cervicalgia  Cramp and spasm  Abnormal posture  Muscle weakness (generalized)  Rationale for Evaluation and Treatment Rehabilitation  PERTINENT HISTORY: Work-related injury on 06/30/22   PRECAUTIONS: none    SUBJECTIVE:                                                                                                                                                                                      SUBJECTIVE STATEMENT:  Patient reports continued right shoulder pain that is mainly located to the front of the shoulder. She states after last visit she went home and just slept because of the pain. She feels like the pain has remained consistent without improvement since the day she had to work on the Albumin bags at work and  since then has been severely limited with any activity involving use of her right shoulder.  PAIN:  Are you having pain? Yes:  NPRS scale: 8/10 Pain location: Right shoulder Pain description: Sore,  Aggravating factors: Overhead reach, repetitive shoulder activity Relieving factors: Taping, stretching, needling   OBJECTIVE: (objective measures completed at initial evaluation unless otherwise dated) DIAGNOSTIC FINDINGS:  IMPRESSION: Negative cervical spine radiographs.   PATIENT SURVEYS:  FOTO 57% function to 66% predicted  09/21/2022: 47% 10/16/22: 63% function  11/06/2022: not assessed 11/22/2022: not assessed   POSTURE:  Rounded shoulders and forward head   PALPATION: Tautness and palpable tenderness Rt upper trap, cervical paraspinals, middle trap, levator scapulae, rhomboids             CERVICAL ROM:    Active ROM A/PROM (deg) eval 08/14/22 08/24/2022 08/31/22 10/06/22 11/06/2022 11/22/2022  Flexion 42 pn 62 "pulling"    75 WFL 50  Extension 50 "sore" 50 "pulling"   70 WFL 55  Right lateral flexion 35 pn   50  WFL 35  Left lateral flexion 30 pn   50  WFL 30  Right rotation 80 "pull"   80 85 WFL 60  Left rotation 60 pn  65 65 75 WFL 75   (Blank rows = not tested)   UPPER EXTREMITY ROM:   Active ROM Right eval Left eval Right 09/21/2022 Right 09/27/22 Rt / Lt 10/12/22 10/16/22 Right Rt / Lt 11/06/2022 11/09/22 right Right 11/22/2022  Shoulder flexion 145 "sore" Full  150 170 pain upper trap  Full  150 / 170 165 135 - pain  limited  Shoulder extension             Shoulder abduction             Shoulder adduction             Shoulder extension             Shoulder internal rotation       T10 / T6      Shoulder external rotation             Elbow flexion             Elbow extension             Wrist flexion             Wrist extension             Wrist ulnar deviation             Wrist radial deviation             Wrist pronation             Wrist supination              (Blank rows = not tested)   UPPER EXTREMITY MMT:   MMT Right eval Left eval Right 08/24/2022 10/16/22 Rt / Lt 11/06/2022  Shoulder flexion 4+ pn 5  5 bilateral   Shoulder extension         Shoulder abduction 5 5  5  bilateral   Shoulder adduction         Shoulder extension         Shoulder internal rotation         Shoulder external rotation         Middle trapezius 4+ pn 4+  4 4 bilateral 4 / 4  Lower trapezius 4- pn 4-  4- 4- bilateral 4- /  4-  Elbow flexion         Elbow extension         Wrist flexion         Wrist extension         Wrist ulnar deviation         Wrist radial deviation         Wrist pronation         Wrist supination         Grip strength          (Blank rows = not tested)  11/22/2022: strength assessment not performed secondary to her right shoulder pain   CERVICAL SPECIAL TESTS:  Cervical compression (-) Cervical distraction  (-)    FUNCTIONAL TESTS:  N/A   TODAY'S TREATMENT [facility] Adult PT Treatment:                                                DATE: 11/22/22 Therapeutic Exercise: UBE L1 x 4 min (fwd/bwd) while taking subjective Sidelying thoracic rotation x 10 each Supine thoracic extension mobs over FR Supine chin tuck 10 x 5 sec Cat cow x 5 Quadruped thread the needle with FM x 5 each Child's pose stretch x 30 sec Supine serratus punch x 10 Sidelying shoulder ER x 10 Sidelying shoulder abduction x 10 Seated upper trap and levator scap stretch 2 x 15 sec each on right Seated shoulder blade  squeeze postural correction x 10   [facility] Adult PT Treatment:                                                DATE: 11/20/22 Therapeutic Exercise: UBE L1 x 4 min (fwd/bwd) while taking subjective Seated upper trap and levator scap stretch 2 x 15 sec each on right Supine dowel shoulder flexion x 10 Supine serratus punch 2 x 10 Sidelying shoulder ER 2 x 10 Sidelying shoulder abduction 2 x 10 Seated shoulder blade squeeze postural correction x 10 Manual Therapy: Skilled palpation and monitoring of muscle tension while performing TPDN STM for right deltoid Right GHJ and scapulothoracic mobs all directions Right shoulder PROM all directions K-tape for right deltoid, upper trap, shoulder depression and retraction / postural correction Trigger Point Dry Needling Treatment: Pre-treatment instruction: Patient instructed on dry needling rationale, procedures, and possible side effects including pain during treatment (achy,cramping feeling), bruising, drop of blood, lightheadedness, nausea, sweating. Patient Consent Given: Yes Education handout provided: Previously provided Muscles treated: Right deltoid (all heads) Needle size and number: .30x13mm x 5 Electrical stimulation performed: No Parameters: N/A Treatment response/outcome: Twitch response elicited and Palpable decrease in muscle tension Post-treatment instructions: Patient instructed to expect possible mild to moderate muscle soreness later today and/or tomorrow. Patient instructed in methods to reduce muscle soreness and to continue prescribed HEP. If patient was dry needled over the lung field, patient was instructed on signs and symptoms of pneumothorax and, however unlikely, to see immediate medical attention should they occur. Patient was also educated on signs and symptoms of infection and to seek medical attention should they occur. Patient verbalized understanding of these instructions and education.  [facility] Adult PT Treatment:  DATE: 11/15/22 Therapeutic Exercise: Supine scapular retraction 2 x 10  Supine shoulder AAROM with dowel 2 x 5 Supine cervical retraction 2 x 10  Manual Therapy: STM Rt upper trap, levator scapulae, rhomboids Rt shoulder PROM to tolerance Rt shoulder GHJ oscillations   [facility] Adult PT Treatment:                                                DATE: 11/13/22 Manual Therapy: Skilled palpation and monitoring of muscle tension while performing TPDN STM for right cervical paraspinals, upper trap, posterior shoulder and rhomboid region Suboccipital release with gentle manual traction Cervical PROM and upper trap/levator scap stretching  Right GHJ and scapulothoracic mobs all directions Right shoulder PROM all directions K-tape for right upper trap, shoulder depression and retraction / postural correction Trigger Point Dry Needling Treatment: Pre-treatment instruction: Patient instructed on dry needling rationale, procedures, and possible side effects including pain during treatment (achy,cramping feeling), bruising, drop of blood, lightheadedness, nausea, sweating. Patient Consent Given: Yes Education handout provided: Previously provided Muscles treated: Right upper trap Needle size and number: .30x2mm x 3 Electrical stimulation performed: No Parameters: N/A Treatment response/outcome: Twitch response elicited and Palpable decrease in muscle tension Post-treatment instructions: Patient instructed to expect possible mild to moderate muscle soreness later today and/or tomorrow. Patient instructed in methods to reduce muscle soreness and to continue prescribed HEP. If patient was dry needled over the lung field, patient was instructed on signs and symptoms of pneumothorax and, however unlikely, to see immediate medical attention should they occur. Patient was also educated on signs and symptoms of infection and to seek medical attention should they occur. Patient  verbalized understanding of these instructions and education. Modalities: E-stim TENS premod to right upper trap and rhomboid region with MHP applied x 10 min Intensity to patient tolerance  [facility] Adult PT Treatment:                                                DATE: 11/09/22 Therapeutic Exercise: UBE level 2 x 4 minutes  90/90 position with horizontal shoulder abduction red band 2 x 10  Standing serratus punch attempted with black band, unable; no resistance 2 x 10  Neuromuscular re-ed: Shoulder elevation and abduction with mirror for visual feedback and heavy cues on postural correction and core engagement multiple reps   [facility] Adult PT Treatment:                                                DATE: 11/06/22 Therapeutic Exercise: Supine thoracic mobs with peanut ball combined with shoulder flexion at various levels of upper thoracic spine Sleeper stretch 3 x 30 sec on right Doorway pec stretch 3 x 30 sec Prone single arm T and Y with thumb up off edge of table x 10 each Supine serratus punch x 10 Manual Therapy: Skilled palpation and monitoring of muscle tension while performing TPDN Prone thoracic extension thrust manipulation Right GHJ and scapulothoracic mobs all directions Right shoulder PROM all directions Scap pinned shoulder stretching flexion and cross body K-tape for shoulder depression and retraction /  postural correction Trigger Point Dry Needling Treatment: Pre-treatment instruction: Patient instructed on dry needling rationale, procedures, and possible side effects including pain during treatment (achy,cramping feeling), bruising, drop of blood, lightheadedness, nausea, sweating. Patient Consent Given: Yes Education handout provided: Previously provided Muscles treated: Right upper trap, rhomboid region, subscap, lat region Needle size and number: .30x66mm x 8 Electrical stimulation performed: No Parameters: N/A Treatment response/outcome: Twitch response elicited and  Palpable decrease in muscle tension Post-treatment instructions: Patient instructed to expect possible mild to moderate muscle soreness later today and/or tomorrow. Patient instructed in methods to reduce muscle soreness and to continue prescribed HEP. If patient was dry needled over the lung field, patient was instructed on signs and symptoms of pneumothorax and, however unlikely, to see immediate medical attention should they occur. Patient was also educated on signs and symptoms of infection and to seek medical attention should they occur. Patient verbalized understanding of these instructions and education. Self care: Instruction and demonstration of postural brace  PATIENT EDUCATION:  Education details: HEP, follow-up with ortho referring provider, further advanced imaging due to lack of progress with pain and continued limitations Person educated: Patient Education method: Explanation Education comprehension: Verbalized understanding   HOME EXERCISE PROGRAM: Access Code: [email or web address]    ASSESSMENT: CLINICAL IMPRESSION: Patient with fair tolerance for therapy due to pain, no adverse effects reported with therapy. She remains limited with all activities involving her right shoulder due to pain, and her pain this visit is mainly located to the anterior right shoulder. She did not report benefit following TPDN for the right deltoid lats visit so deferred any dry needling this visit. Therapy focused on therex this visit to address thoracic and cervical mobility deficits, right shoulder motion limitation, postural deviation and right shoulder strength deficits. Her current symptoms that were exacerbated on 11/13/2022 seem to be more related to the right shoulder rather than cervical radiculopathy in nature. However, since the beginning of her episode in therapy, it is unclear the exact etiology of her symptoms. Over the course of therapy she has responded to treatments including TPDN, cervical traction,  thoracic and cervical mobility, postural strengthening and control, and manual therapy specific for the right shoulder; however since her most recent exacerbation she is not responding well to therapy treatments and PT has exhausted most options for treatment without benefit. At this point, the patient has had ongoing symptoms for greater than four months with multiple instances of improvement and exacerbation, but overall her pain has persisted and she remains limited with her activities at work. At this time she would likely benefit from further advanced imaging of the right shoulder vs. cervical spine as previous PT treatment and injections have not provided her relief. As noted above her cervical range of motion and right shoulder motion has worsened since last assessment and her pain has limited her progress in therapy. She will follow-up with her orthopedic referring provider on 11/24/2022 to determine further assessment and treatment options. Overall, she would benefit from continued skilled PT to progress her mobility and strength in order to reduce pain and maximize functional ability with work related tasks.   OBJECTIVE IMPAIRMENTS: Decreased activity tolerance, decreased knowledge of condition, decreased mobility, decreased ROM, decreased strength, increased fascial restrictions, impaired flexibility, impaired UE functional use, improper body mechanics, postural dysfunction, and pain.    ACTIVITY LIMITATIONS: Carrying, lifting, and reach over head   PARTICIPATION LIMITATIONS: Meal prep, cleaning, laundry, driving, shopping, occupation, and yard work   PERSONAL FACTORS: Age, Profession, and Time  since onset of injury/illness/exacerbation are also affecting patient's functional outcome.      GOALS: Goals reviewed with patient? Yes   SHORT TERM GOALS: Target date: 08/21/2022   Patient will be independent and compliant with initial HEP.    Baseline: issued at eval  Goal status: met   2.   Patient will demonstrate at least 70 degrees of Lt cervical rotation AROM to improve ability to complete head turns while driving.  Baseline: see above  08/24/2022: 64 deg 08/31/22: see flow sheets 11/22/2022: patient regressed since recent exacerbation, see above Goal status: ONGOING   3.  Patient will demonstrate at least 50 degrees of cervical flexion AROM to improve tolerance to reading and work tasks.  Baseline: see above  Goal status: met   LONG TERM GOALS: Target date: 12/04/2022   Patient will demonstrate at least 160 degrees of Rt shoulder flexion AROM to improve ability to reach overhead at work.  Baseline: see above  09/21/2022: 150 deg 11/06/2022: 150 deg 11/22/2022: 135 deg Goal status: ONGOING   2.  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  Baseline: unable  09/21/2022: patient reports continued limitation with reaching overhead 10/16/22: 45 seconds 11/06/2022: patient reports continued limitation 11/22/2022: patient reports continued limitation Goal status: ONGOING   3.  Patient will score at least 66% on FOTO to signify clinically meaningful improvement in functional abilities.  Baseline: see above  09/21/2022: 47% 11/06/2022: not assessed 11/22/2022: not assessed Goal status: ONGOING   4.  Patient will report pain as </=2/10 to reduce her current functional limitations.  Baseline: see above  09/21/2022: 7/10 10/16/22: at worst 6/10  11/06/2022: 4/10 11/22/2022: 8/10 Goal status: ONGOING  5. Patient will demonstrate 5/5 middle trap strength to improve ability to complete pulling activity at work.    Baseline: see above  11/06/2022: limitations noted above 11/22/2022: not assessed due to pain  Goal status: ONGOING     PLAN: PT FREQUENCY: 2x/week   PT DURATION: 4 weeks   PLANNED INTERVENTIONS: Therapeutic exercises, Therapeutic activity, Neuromuscular  re-education, Patient/Family education, Self Care, Joint mobilization, Dry Needling, Spinal manipulation, Spinal mobilization, Cryotherapy, Moist heat, Traction, Manual therapy, and Re-evaluation   PLAN FOR NEXT SESSION: Review and progress HEP PRN, manual/TPDN to cervical and right shoulder region, cervical/thoracic/shoulder mobility, postural strengthening   Rosana Hoes, PT, DPT, LAT, ATC 11/22/22  5:27 PM Phone: [phone] Fax: [phone]   PHYSICAL THERAPY DISCHARGE SUMMARY  Visits from Start of Care: 24  Current functional level related to goals / functional outcomes: See above   Remaining deficits: See above   Education / Equipment: HEP   Patient agrees to discharge. Patient goals were not met. Patient is being discharged due to a change in medical status.  Rosana Hoes, PT, DPT, LAT, ATC 02/05/23  1:04 PM Phone: [phone] Fax: [phone]

## 2022-11-24 ENCOUNTER — Other Ambulatory Visit (HOSPITAL_COMMUNITY): Payer: Self-pay

## 2022-11-24 ENCOUNTER — Other Ambulatory Visit (HOSPITAL_COMMUNITY): Payer: Self-pay | Admitting: Specialist

## 2022-11-24 DIAGNOSIS — M542 Cervicalgia: Secondary | ICD-10-CM

## 2022-11-24 DIAGNOSIS — M25511 Pain in right shoulder: Secondary | ICD-10-CM

## 2022-11-24 MED ORDER — MELOXICAM 15 MG PO TABS
15.0000 mg | ORAL_TABLET | Freq: Every day | ORAL | 1 refills | Status: DC | PRN
  Filled 2022-11-24: qty 30, 30d supply, fill #0

## 2022-11-24 MED ORDER — TIZANIDINE HCL 4 MG PO CAPS
4.0000 mg | ORAL_CAPSULE | Freq: Every day | ORAL | 1 refills | Status: DC | PRN
  Filled 2022-11-24: qty 30, 30d supply, fill #0

## 2022-11-30 ENCOUNTER — Ambulatory Visit (HOSPITAL_COMMUNITY)
Admission: RE | Admit: 2022-11-30 | Discharge: 2022-11-30 | Disposition: A | Discharge status: Home / Self Care | Payer: PRIVATE HEALTH INSURANCE | Source: Ambulatory Visit | Attending: Specialist | Admitting: Specialist

## 2022-11-30 DIAGNOSIS — M25511 Pain in right shoulder: Secondary | ICD-10-CM | POA: Diagnosis present

## 2022-11-30 DIAGNOSIS — M542 Cervicalgia: Secondary | ICD-10-CM | POA: Diagnosis not present

## 2022-12-12 ENCOUNTER — Other Ambulatory Visit (HOSPITAL_COMMUNITY): Payer: Self-pay

## 2022-12-12 ENCOUNTER — Encounter: Payer: Self-pay | Admitting: Family Medicine

## 2022-12-12 ENCOUNTER — Ambulatory Visit: Payer: 59 | Admitting: Family Medicine

## 2022-12-12 VITALS — BP 124/68 | HR 78 | Temp 96.9°F | Ht 61.0 in | Wt 121.0 lb

## 2022-12-12 DIAGNOSIS — F332 Major depressive disorder, recurrent severe without psychotic features: Secondary | ICD-10-CM | POA: Diagnosis not present

## 2022-12-12 DIAGNOSIS — F411 Generalized anxiety disorder: Secondary | ICD-10-CM | POA: Diagnosis not present

## 2022-12-12 MED ORDER — DESVENLAFAXINE SUCCINATE ER 50 MG PO TB24
50.0000 mg | ORAL_TABLET | Freq: Every day | ORAL | 0 refills | Status: DC
  Filled 2022-12-12: qty 90, 90d supply, fill #0

## 2022-12-12 NOTE — Progress Notes (Unsigned)
Subjective:  Patient ID: Brenda Mcmahon, female    DOB: [birth year]  Age: 32 y.o. MRN: [id number]  Chief Complaint  Patient presents with   Medical Management of Chronic Issues    HPI Patient presents today for 1 month follow up. States she can tell when she takes her pristiq . Has not taken her medication for the past 3 days due to a family emergency with her mother. Has taken ambien once which did help.      12/12/2022    3:47 PM 11/07/2022    4:05 PM 10/10/2022    2:29 PM 09/13/2021   11:19 AM 08/03/2021   10:14 AM  Depression screen PHQ 2/9  Decreased Interest 1 2 2 1 2   Down, Depressed, Hopeless 2 2 2 1 2   PHQ - 2 Score 3 4 4 2 4   Altered sleeping 2 2 3 3 1   Tired, decreased energy 1 2 3 1 2   Change in appetite 2 2 3 1 2   Feeling bad or failure about yourself  2 2 3 1 2   Trouble concentrating 1 2 3 1 2   Moving slowly or fidgety/restless 1 2 3 2 2   Suicidal thoughts 1 2 3  0 0  PHQ-9 Score 13 18 25 11 15   Difficult doing work/chores Somewhat difficult Somewhat difficult Very difficult Somewhat difficult Somewhat difficult        12/12/2022    3:46 PM  Fall Risk   Falls in the past year? 0  Number falls in past yr: 0  Injury with Fall? 0  Risk for fall due to : No Fall Risks  Follow up Falls evaluation completed    Patient Care Team: Blane Ohara, MD as PCP - General (Family Medicine)   Review of Systems  Constitutional:  Positive for fatigue. Negative for chills and fever.  HENT:  Negative for congestion, ear pain and sore throat.   Respiratory:  Negative for cough and shortness of breath.   Cardiovascular:  Negative for chest pain.  Gastrointestinal:  Negative for abdominal pain, constipation, diarrhea, nausea and vomiting.  Psychiatric/Behavioral:  Positive for dysphoric mood. The patient is nervous/anxious.     Current Outpatient Medications on File Prior to Visit  Medication Sig Dispense Refill   zolpidem (AMBIEN) 5 MG tablet Take 1 tablet (5 mg total)  by mouth at bedtime as needed for sleep. 30 tablet 2   acebutolol (SECTRAL) 200 MG capsule Take 1 capsule (200 mg total) by mouth 2 (two) times daily. 180 capsule 3   fluticasone (FLONASE) 50 MCG/ACT nasal spray Place 2 sprays into both nostrils daily. 16 g 6   meloxicam (MOBIC) 15 MG tablet Take 1 tablet (15 mg total) by mouth daily as needed. Take with a meal. 30 tablet 1   tiZANidine (ZANAFLEX) 4 MG capsule Take 1 capsule (4 mg total) by mouth daily as needed. 30 capsule 1   No current facility-administered medications on file prior to visit.   Past Medical History:  Diagnosis Date   Palpitation    Past Surgical History:  Procedure Laterality Date   FOOT FRACTURE SURGERY Right 2015   also ankle fracture repaired.    WRIST FRACTURE SURGERY Left    32 yo.  fell through glass door.     Family History  Problem Relation Age of Onset   Seizures Mother    Hypotension Mother    Asthma Brother    Seizures Maternal Grandfather    Social History   Socioeconomic History  Marital status: Single    Spouse name: Not on file   Number of children: Not on file   Years of education: Not on file   Highest education level: Not on file  Occupational History   Occupation: Pharmacologist    Employer: [employer]  Tobacco Use   Smoking status: Never   Smokeless tobacco: Never  Substance and Sexual Activity   Alcohol use: No   Drug use: Never   Sexual activity: Not on file  Other Topics Concern   Not on file  Social History Narrative   Not on file   Social Determinants of Health   Financial Resource Strain: Low Risk  (09/13/2021)   Overall Financial Resource Strain (CARDIA)    Difficulty of Paying Living Expenses: Not very hard  Food Insecurity: No Food Insecurity (09/13/2021)   Hunger Vital Sign    Worried About Running Out of Food in the Last Year: Never true    Ran Out of Food in the Last Year: Never true  Transportation Needs: No Transportation Needs (09/13/2021)   PRAPARE -  Administrator, Civil Service (Medical): No    Lack of Transportation (Non-Medical): No  Physical Activity: Inactive (10/10/2022)   Exercise Vital Sign    Days of Exercise per Week: 0 days    Minutes of Exercise per Session: 0 min  Stress: No Stress Concern Present (10/10/2022)   [employer] of Occupational Health - Occupational Stress Questionnaire    Feeling of Stress : Not at all  Social Connections: Moderately Isolated (10/10/2022)   Social Connection and Isolation Panel [NHANES]    Frequency of Communication with Friends and Family: Three times a week    Frequency of Social Gatherings with Friends and Family: Three times a week    Attends Religious Services: More than 4 times per year    Active Member of Clubs or Organizations: No    Attends Banker Meetings: Never    Marital Status: Never married    Objective:  BP 124/68   Pulse 78   Temp (!) 96.9 F (36.1 C)   Ht 5\' 1"  (1.549 m)   Wt 121 lb (54.9 kg)   SpO2 100%   BMI 22.86 kg/m      12/12/2022    3:44 PM 11/07/2022    3:48 PM 10/10/2022    2:27 PM  BP/Weight  Systolic BP 124 124 110  Diastolic BP 68 76 64  Wt. (Lbs) 121 124 131  BMI 22.86 kg/m2 22.68 kg/m2 23.96 kg/m2    Physical Exam Vitals reviewed.  Constitutional:      Appearance: Normal appearance. She is normal weight.  Cardiovascular:     Rate and Rhythm: Normal rate and regular rhythm.     Heart sounds: Normal heart sounds.  Pulmonary:     Effort: Pulmonary effort is normal. No respiratory distress.     Breath sounds: Normal breath sounds.  Neurological:     Mental Status: She is alert and oriented to person, place, and time.  Psychiatric:        Mood and Affect: Mood normal.        Behavior: Behavior normal.     Diabetic Foot Exam - Simple   No data filed      Lab Results  Component Value Date   WBC 6.7 10/10/2022   HGB 12.3 10/10/2022   HCT 37.8 10/10/2022   PLT 197 10/10/2022   GLUCOSE 84 09/13/2021    CHOL 160  10/10/2022   TRIG 91 10/10/2022   HDL 63 10/10/2022   LDLCALC 80 10/10/2022   ALT 14 09/13/2021   AST 20 09/13/2021   NA 137 09/13/2021   K 4.5 09/13/2021   CL 102 09/13/2021   CREATININE 0.88 09/13/2021   BUN 7 09/13/2021   CO2 22 09/13/2021   TSH 1.530 10/10/2022      Assessment & Plan:    GAD (generalized anxiety disorder) Assessment & Plan: Continue Pristiq to 50 mg daily. Continue counseling.   Orders: -     Desvenlafaxine Succinate ER; Take 1 tablet (50 mg total) by mouth daily.  Dispense: 90 tablet; Refill: 0  Severe episode of recurrent major depressive disorder, without psychotic features [facility]) Assessment & Plan: The current medical regimen is helping. She has improved. Emphasized compliance as discontinuaiton of pristiq can cause abrupt worsening of depression and anxiety. Patient does not wish to increase dose.  Continue Pristiq 50 mg daily.   Orders: -     Desvenlafaxine Succinate ER; Take 1 tablet (50 mg total) by mouth daily.  Dispense: 90 tablet; Refill: 0     Meds ordered this encounter  Medications   desvenlafaxine (PRISTIQ) 50 MG 24 hr tablet    Sig: Take 1 tablet (50 mg total) by mouth daily.    Dispense:  90 tablet    Refill:  0    No orders of the defined types were placed in this encounter.    Follow-up: Return in about 3 months (around 03/14/2023).   I,Marla I Leal-Borjas,acting as a scribe for Blane Ohara, MD.,have documented all relevant documentation on the behalf of Blane Ohara, MD,as directed by  Blane Ohara, MD while in the presence of Blane Ohara, MD.   An After Visit Summary was printed and given to the patient.  Blane Ohara, MD [facility] [phone]

## 2022-12-14 ENCOUNTER — Telehealth: Payer: Self-pay | Admitting: Family Medicine

## 2022-12-14 NOTE — Telephone Encounter (Signed)
REQUESTED HER FMLA PAPERS TO ADJUSTED WITH NEW INFO - LEFT IN DR. [facility] BOX -- PT IS AWARE THAT THERE MIGHT BE A CHARGE

## 2022-12-15 NOTE — Assessment & Plan Note (Signed)
The current medical regimen is helping. She has improved. Emphasized compliance as discontinuaiton of pristiq can cause abrupt worsening of depression and anxiety. Patient does not wish to increase dose.  Continue Pristiq 50 mg daily.

## 2022-12-15 NOTE — Assessment & Plan Note (Signed)
Continue Pristiq to 50 mg daily. Continue counseling.

## 2022-12-22 ENCOUNTER — Other Ambulatory Visit (HOSPITAL_COMMUNITY): Payer: Self-pay

## 2023-02-14 ENCOUNTER — Ambulatory Visit: Payer: PRIVATE HEALTH INSURANCE | Attending: Family Medicine | Admitting: Physical Therapy

## 2023-02-14 ENCOUNTER — Encounter: Payer: Self-pay | Admitting: Physical Therapy

## 2023-02-14 ENCOUNTER — Other Ambulatory Visit: Payer: Self-pay

## 2023-02-14 DIAGNOSIS — M25511 Pain in right shoulder: Secondary | ICD-10-CM | POA: Insufficient documentation

## 2023-02-14 DIAGNOSIS — M6281 Muscle weakness (generalized): Secondary | ICD-10-CM

## 2023-02-14 DIAGNOSIS — M542 Cervicalgia: Secondary | ICD-10-CM

## 2023-02-14 DIAGNOSIS — G8929 Other chronic pain: Secondary | ICD-10-CM

## 2023-02-14 NOTE — Patient Instructions (Signed)
Access Code: [email or web address] URL: [email or web address] Date: 02/14/2023 Prepared by: Rosana Hoes  Exercises - Sidelying Shoulder ER with Towel and Dumbbell  - 1 x daily - 3 sets - 10 reps - Supine Scapular Protraction in Flexion with Dumbbells  - 1 x daily - 3 sets - 10 reps - Prone Scapular Slide with Shoulder Extension  - 1 x daily - 3 sets - 10 reps - 5 seconds hold

## 2023-02-14 NOTE — Therapy (Unsigned)
OUTPATIENT PHYSICAL THERAPY EVALUATION   Patient Name: Brenda Mcmahon MRN: [id number] DOB:05/11/1990, 32 y.o., female Today's Date: 02/15/2023   END OF SESSION:  PT End of Session - 02/14/23 1302     Visit Number 1    Number of Visits 12    Date for PT Re-Evaluation 03/28/23    Authorization Type Worker's Comp    PT Start Time 1305    PT Stop Time 1355    PT Time Calculation (min) 50 min    Activity Tolerance Patient tolerated treatment well    Behavior During Therapy WFL for tasks assessed/performed             Past Medical History:  Diagnosis Date   Palpitation    Past Surgical History:  Procedure Laterality Date   FOOT FRACTURE SURGERY Right 2015   also ankle fracture repaired.    WRIST FRACTURE SURGERY Left    32 yo.  fell through glass door.    Patient Active Problem List   Diagnosis Date Noted   Other insomnia 10/14/2022   Routine medical exam 09/18/2021   Need for DTaP vaccine 09/18/2021   Moderate recurrent major depression (HCC) 08/07/2021   GAD (generalized anxiety disorder) 08/07/2021   Encounter for other contraceptive management 08/07/2021   Palpitation 11/10/2020   Achilles bursitis of right lower extremity 03/14/2017   Recurrent major depression-severe (HCC) 03/14/2017    PCP: Blane Ohara, MD  REFERRING PROVIDER: Jene Every, MD   REFERRING DIAG: Pain in right shoulder   THERAPY DIAG:  Chronic right shoulder pain  Cervicalgia  Muscle weakness (generalized)  Rationale for Evaluation and Treatment: Rehabilitation  ONSET DATE: 06/30/2022   SUBJECTIVE:                                                                                                                                                                                     SUBJECTIVE STATEMENT: Patient previously in PT due to work related injury resulting in neck and right shoulder pain. She stopped therapy due to getting an MRI for the neck and shoulder on 11/30/2022. She  returns to PT with continued right shoulder pain. She did get an injection 01/23/2023 that patient reports made the right shoulder feel worse. States the injection helped for about two hours and then by that night the pain was back. Patient locates pain on the outside of the shoulder and she does report continued right sided neck tightness, which has improved since her last stint in therapy. She reports majority of symptoms occur with lifting arm out to the side and reaching up, and she avoids reaching behind her back. She has difficulty with washing hair, doing  laundry or hanging clothes, reaching up in cabinets, she is unable to drive using the right arm, and she been out of work because she is unable to complete work related tasks.   Hand dominance: Ambidextrous  PERTINENT HISTORY: See PMH above  PAIN:  Are you having pain? Yes:  NPRS scale: 6/10 at rest, 8/10 with activity Pain location: Right shoulder Pain description: Pins and needles,  Aggravating factors: Lifting right arm out to the side, reaching overhead, any activity that requires use of right arm Relieving factors: Heating pad  PRECAUTIONS: Patient reports she is still on 5 lbs lifting, pushing, pulling restriction  RED FLAGS: None   WEIGHT BEARING RESTRICTIONS: No  FALLS:  Has patient fallen in last 6 months? No  OCCUPATION: [place], pharmacy technician; currently out on FMLA  PLOF: Independent  PATIENT GOALS: Pain relief, be able to use the right arm, return to prior level of function  NEXT MD VISIT: 02/20/2023   OBJECTIVE:  Note: Objective measures were completed at Evaluation unless otherwise noted. DIAGNOSTIC FINDINGS:  MRI right shoulder 11/30/2022: IMPRESSION: 1. Mild tendinosis of the supraspinatus tendon with a small partial-thickness bursal surface tear anteriorly.  MRI cervical spine 11/30/2022 IMPRESSION: 1. Mild noncompressive disc bulging at C3-4 through C6-7 without significant spinal stenosis  or neural impingement. 2. No significant foraminal encroachment within the cervical spine.  PATIENT SURVEYS:  FOTO 42% functional status  COGNITION: Overall cognitive status: Within functional limits for tasks assessed     SENSATION: Appears intact  POSTURE: Rounded shoulder posture  CERVICAL ROM:   Active ROM A/PROM (deg) eval  Flexion 65  Extension 65  Right lateral flexion 40  Left lateral flexion 45  Right rotation 75  Left rotation 80   (Blank rows = not tested)  UPPER EXTREMITY ROM:   Active ROM Right eval Left eval  Shoulder flexion 140 160  Shoulder extension 40 70  Shoulder abduction 135 180  Shoulder adduction    Shoulder internal rotation L5 T6  Shoulder external rotation 85 / T1 85 / T5  Elbow flexion [facility] WFL  Elbow extension [facility] WFL  Wrist flexion    Wrist extension    Wrist ulnar deviation    Wrist radial deviation    Wrist pronation    Wrist supination    (Blank rows = not tested)  Right shoulder PROM grossly WFL but with pain at end range primarily in shoulder flexion, abduction, and IR  UPPER EXTREMITY MMT:  MMT Right eval Left eval  Shoulder flexion 4 5  Shoulder scaption 4 5  Shoulder extension 5 5  Shoulder abduction 4 5  Shoulder adduction    Shoulder internal rotation 5 5  Shoulder external rotation 4 5  Middle trapezius 4- 4  Lower trapezius 3+ 4  Serratus anterior 4 5  Elbow flexion 5 5  Elbow extension 5 5  Wrist flexion    Wrist extension    Wrist ulnar deviation    Wrist radial deviation    Wrist pronation    Wrist supination    Grip strength (lbs)    (Blank rows = not tested)  SHOULDER SPECIAL TESTS: Impingement tests: Hawkins/Kennedy impingement test: positive  SLAP lesions: Negative Instability tests: Negative Rotator cuff assessment: Negative Biceps assessment: Negative  JOINT MOBILITY TESTING:  Hypomobility of right GHJ primarily posterior and inferior direction  PALPATION:  Tender to palpation with  palpable trigger points right middle deltoid, infraspinatus and teres minor, subscap, and latissimus dorsi  TODAY'S TREATMENT:    [facility] Adult PT Treatment:                                                DATE: 02/14/2023 Therapeutic Exercise: Sidelying ER with 2# x 10 Supine serrrtus punch with 2# x 10 Prone I x 10 Trigger Point Dry Needling Treatment: Pre-treatment instruction: Patient instructed on dry needling rationale, procedures, and possible side effects including pain during treatment (achy,cramping feeling), bruising, drop of blood, lightheadedness, nausea, sweating. Patient Consent Given: Yes Education handout provided: Previously provided Muscles treated: Right middle deltoid, upper trap, infraspinatus, subscapularis  Needle size and number: .30x30mm x 2 and .20x30mm x 4 Electrical stimulation performed: No Parameters: N/A Treatment response/outcome: Twitch response elicited and Palpable decrease in muscle tension Post-treatment instructions: Patient instructed to expect possible mild to moderate muscle soreness later today and/or tomorrow. Patient instructed in methods to reduce muscle soreness and to continue prescribed HEP. If patient was dry needled over the lung field, patient was instructed on signs and symptoms of pneumothorax and, however unlikely, to see immediate medical attention should they occur. Patient was also educated on signs and symptoms of infection and to seek medical attention should they occur. Patient verbalized understanding of these instructions and education.  PATIENT EDUCATION: Education details: Exam findings, POC, HEP, TPDN Person educated: Patient Education method: Explanation, Demonstration, Tactile cues, Verbal cues, and Handouts Education comprehension: verbalized understanding, returned demonstration, verbal cues required, tactile cues required, and needs further education  HOME EXERCISE PROGRAM: Access Code: [email or web address]     ASSESSMENT: CLINICAL IMPRESSION: Patient is a 32 y.o. female who was seen today for physical therapy evaluation and treatment for chronic right shoulder pain and neck tightness. Her symptoms seem consistent with right rotator cuff tendinopathy. She demonstrates limitations in all planes of right shoulder motion with pain, strength deficits of the right rotator cuff and periscapular musculature, postural deficits, good cervical ROM but with tightness noted on right side, all impacting her functional ability.    OBJECTIVE IMPAIRMENTS: decreased activity tolerance, decreased ROM, decreased strength, impaired flexibility, postural dysfunction, and pain.   ACTIVITY LIMITATIONS: carrying, lifting, sleeping, dressing, reach over head, and hygiene/grooming  PARTICIPATION LIMITATIONS: meal prep, cleaning, and occupation  PERSONAL FACTORS: Past/current experiences, Profession, and Time since onset of injury/illness/exacerbation are also affecting patient's functional outcome.   REHAB POTENTIAL: Good  CLINICAL DECISION MAKING: Stable/uncomplicated  EVALUATION COMPLEXITY: Low   GOALS: Goals reviewed with patient? Yes  SHORT TERM GOALS: = LTG  LONG TERM GOALS: Target date: 03/28/2023  Patient will be I with final HEP to maintain progress from PT. Baseline: HEP provided at eval Goal status: INITIAL  2.  Patient will report >/= 60% status on FOTO to indicate improved functional ability. Baseline: 42% functional status Goal status: INITIAL  3.  Patient will demonstrate right shoulder flexion >/= 160 deg in order to improve overhead reach and ability to reach into high cabinets Baseline: 140 deg Goal status: INITIAL  4.  Patient will demonstrate right rotator cuff strength 5/5 MMT and periscapular strength >/= 4/5 MMT in order to improve her ability to perform repetitive tasks using the right arm and complete work related tasks Baseline: see limitations above Goal status: INITIAL  5.   Patient will be able to perform all self care and grooming tasks with </= 1/10 pain in order to reduce functional  limitations Baseline: patient reports pain up to 8/10 Goal status: INITIAL   PLAN: PT FREQUENCY: 1-2x/week  PT DURATION: 8 weeks  PLANNED INTERVENTIONS: 97164- PT Re-evaluation, 97110-Therapeutic exercises, 97530- Therapeutic activity, 97112- Neuromuscular re-education, 97535- Self Care, 18299- Manual therapy, 97014- Electrical stimulation (unattended), [phone]- Electrical stimulation (manual), [phone]- Ionotophoresis 4mg /ml Dexamethasone, Patient/Family education, Taping, Dry Needling, Joint mobilization, Joint manipulation, Spinal manipulation, Spinal mobilization, Cryotherapy, and Moist heat  PLAN FOR NEXT SESSION: Review HEP and progress PRN, manual/TPDN for right cervical and shoulder region, right shoulder mobs, progress right shoulder AROM, progress rotator cuff and periscapular strengthening, rhythmic stabilization exercises   Rosana Hoes, PT, DPT, LAT, ATC 02/15/23  8:01 AM Phone: [phone] Fax: [phone]

## 2023-02-22 DIAGNOSIS — F4312 Post-traumatic stress disorder, chronic: Secondary | ICD-10-CM | POA: Diagnosis not present

## 2023-02-22 DIAGNOSIS — F332 Major depressive disorder, recurrent severe without psychotic features: Secondary | ICD-10-CM | POA: Diagnosis not present

## 2023-02-22 HISTORY — DX: Post-traumatic stress disorder, chronic: F43.12

## 2023-02-22 NOTE — Therapy (Signed)
OUTPATIENT PHYSICAL THERAPY TREATMENT   Patient Name: Brenda Mcmahon MRN: [id number] DOB:06-Dec-1990, 32 y.o., female Today's Date: 02/23/2023   END OF SESSION:  PT End of Session - 02/23/23 0803     Visit Number 2    Number of Visits 12    Date for PT Re-Evaluation 03/28/23    Authorization Type Worker's Comp    Authorization - Visit Number 2    Authorization - Number of Visits 8    PT Start Time 0800    PT Stop Time 0845    PT Time Calculation (min) 45 min    Activity Tolerance Patient tolerated treatment well    Behavior During Therapy WFL for tasks assessed/performed              Past Medical History:  Diagnosis Date   Palpitation    Past Surgical History:  Procedure Laterality Date   FOOT FRACTURE SURGERY Right 2015   also ankle fracture repaired.    WRIST FRACTURE SURGERY Left    32 yo.  fell through glass door.    Patient Active Problem List   Diagnosis Date Noted   Other insomnia 10/14/2022   Routine medical exam 09/18/2021   Need for DTaP vaccine 09/18/2021   Moderate recurrent major depression (HCC) 08/07/2021   GAD (generalized anxiety disorder) 08/07/2021   Encounter for other contraceptive management 08/07/2021   Palpitation 11/10/2020   Achilles bursitis of right lower extremity 03/14/2017   Recurrent major depression-severe (HCC) 03/14/2017    PCP: Blane Ohara, MD  REFERRING PROVIDER: Jene Every, MD   REFERRING DIAG: Pain in right shoulder   THERAPY DIAG:  Chronic right shoulder pain  Cervicalgia  Muscle weakness (generalized)  Rationale for Evaluation and Treatment: Rehabilitation  ONSET DATE: 06/30/2022   SUBJECTIVE:                                                                                                                                                                                     SUBJECTIVE STATEMENT: Patient reports she saw the surgeon and they are going to schedule surgery. States that her shoulder sill  hurts. She was having sharp stabbing pains this morning.   EVAL: Patient previously in PT due to work related injury resulting in neck and right shoulder pain. She stopped therapy due to getting an MRI for the neck and shoulder on 11/30/2022. She returns to PT with continued right shoulder pain. She did get an injection 01/23/2023 that patient reports made the right shoulder feel worse. States the injection helped for about two hours and then by that night the pain was back. Patient locates pain on the outside of the shoulder  and she does report continued right sided neck tightness, which has improved since her last stint in therapy. She reports majority of symptoms occur with lifting arm out to the side and reaching up, and she avoids reaching behind her back. She has difficulty with washing hair, doing laundry or hanging clothes, reaching up in cabinets, she is unable to drive using the right arm, and she been out of work because she is unable to complete work related tasks.   Hand dominance: Ambidextrous  PERTINENT HISTORY: See PMH above  PAIN:  Are you having pain? Yes:  NPRS scale: 8/10 Worst: 9/10 with activity Pain location: Right shoulder Pain description: Pins and needles  Aggravating factors: Lifting right arm out to the side, reaching overhead, any activity that requires use of right arm Relieving factors: Heating pad  PRECAUTIONS: Patient reports she is still on 5 lbs lifting, pushing, pulling restriction  PATIENT GOALS: Pain relief, be able to use the right arm, return to prior level of function  NEXT MD VISIT: 02/20/2023   OBJECTIVE:  Note: Objective measures were completed at Evaluation unless otherwise noted. PATIENT SURVEYS:  FOTO 42% functional status  POSTURE: Rounded shoulder posture  CERVICAL ROM:   Active ROM A/PROM (deg) eval  Flexion 65  Extension 65  Right lateral flexion 40  Left lateral flexion 45  Right rotation 75  Left rotation 80   (Blank rows =  not tested)  UPPER EXTREMITY ROM:   Active ROM Right eval Left eval Right 02/23/2023  Shoulder flexion 140 160 148  Shoulder extension 40 70   Shoulder abduction 135 180   Shoulder adduction     Shoulder internal rotation L5 T6   Shoulder external rotation 85 / T1 85 / T5   Elbow flexion [facility] WFL   Elbow extension [facility] WFL   Wrist flexion     Wrist extension     Wrist ulnar deviation     Wrist radial deviation     Wrist pronation     Wrist supination     (Blank rows = not tested)  Right shoulder PROM grossly WFL but with pain at end range primarily in shoulder flexion, abduction, and IR  UPPER EXTREMITY MMT:  MMT Right eval Left eval Right 02/23/2023  Shoulder flexion 4 5   Shoulder scaption 4 5   Shoulder extension 5 5   Shoulder abduction 4 5   Shoulder adduction     Shoulder internal rotation 5 5   Shoulder external rotation 4 5 4   Middle trapezius 4- 4 4-  Lower trapezius 3+ 4 3+  Serratus anterior 4 5   Elbow flexion 5 5   Elbow extension 5 5   Wrist flexion     Wrist extension     Wrist ulnar deviation     Wrist radial deviation     Wrist pronation     Wrist supination     Grip strength (lbs)     (Blank rows = not tested)  SHOULDER SPECIAL TESTS: Impingement tests: Hawkins/Kennedy impingement test: positive  SLAP lesions: Negative Instability tests: Negative Rotator cuff assessment: Negative Biceps assessment: Negative  JOINT MOBILITY TESTING:  Hypomobility of right GHJ primarily posterior and inferior direction  PALPATION:  Tender to palpation with palpable trigger points right middle deltoid, infraspinatus and teres minor, subscap, and latissimus dorsi     TODAY'S TREATMENT:    [facility] Adult PT Treatment:  DATE: 02/23/2023 Therapeutic Exercise: UBE L1 x 4 min (fwd/bwd) while taking subjective Prone I and T with 1# edge of table x 10 each on right Prone Y edge of table x 10 on right Sidelying  shoulder flexion with 1# x 10 on right Sidelying ER with 1# 2 x 10 Supine rhythmic stabilization at 90 deg flexion with manual pressure at wrist 3 x 30 sec Manual: Right shoulder mobs primarily posterior and inferior at various ranges of elevation Right shoulder PROM all directions Prone thoracic PA thrust manipulation Seated CT junction thrust manipulation   [facility] Adult PT Treatment:                                                DATE: 02/14/2023 Therapeutic Exercise: Sidelying ER with 2# x 10 Supine serrrtus punch with 2# x 10 Prone I x 10 Trigger Point Dry Needling Treatment: Pre-treatment instruction: Patient instructed on dry needling rationale, procedures, and possible side effects including pain during treatment (achy,cramping feeling), bruising, drop of blood, lightheadedness, nausea, sweating. Patient Consent Given: Yes Education handout provided: Previously provided Muscles treated: Right middle deltoid, upper trap, infraspinatus, subscapularis  Needle size and number: .30x73mm x 2 and .20x54mm x 4 Electrical stimulation performed: No Parameters: N/A Treatment response/outcome: Twitch response elicited and Palpable decrease in muscle tension Post-treatment instructions: Patient instructed to expect possible mild to moderate muscle soreness later today and/or tomorrow. Patient instructed in methods to reduce muscle soreness and to continue prescribed HEP. If patient was dry needled over the lung field, patient was instructed on signs and symptoms of pneumothorax and, however unlikely, to see immediate medical attention should they occur. Patient was also educated on signs and symptoms of infection and to seek medical attention should they occur. Patient verbalized understanding of these instructions and education.  PATIENT EDUCATION: Education details: HEP Person educated: Patient Education method: Solicitor, Actor cues, Verbal cues Education comprehension:  verbalized understanding, returned demonstration, verbal cues required, tactile cues required, and needs further education  HOME EXERCISE PROGRAM: Access Code: [email or web address]    ASSESSMENT: CLINICAL IMPRESSION: Patient tolerated therapy well with no adverse effects. Therapy focused on progressing shoulder mobility and rotator cuff and periscapular strengthening. She continues to report lateral right shoulder pain with exercises and exhibits limitations with right shoulder motion and gross strength deficits of the rotator cuff and periscapular musculature. She does require consistent cueing with exercises for proper scapular control. She states that she is planning to have right shoulder arthroscopy so is waiting on a call to schedule surgery. No changes made to her HEP this visit. Patient would benefit from continued skilled PT to progress her right shoulder mobility and strength in order to reduce pain and maximize functional ability.   EVAL: Patient is a 32 y.o. female who was seen today for physical therapy evaluation and treatment for chronic right shoulder pain and neck tightness. Her symptoms seem consistent with right rotator cuff tendinopathy. She demonstrates limitations in all planes of right shoulder motion with pain, strength deficits of the right rotator cuff and periscapular musculature, postural deficits, good cervical ROM but with tightness noted on right side, all impacting her functional ability.    OBJECTIVE IMPAIRMENTS: decreased activity tolerance, decreased ROM, decreased strength, impaired flexibility, postural dysfunction, and pain.   ACTIVITY LIMITATIONS: carrying, lifting, sleeping, dressing, reach over head, and hygiene/grooming  PARTICIPATION LIMITATIONS:  meal prep, cleaning, and occupation  PERSONAL FACTORS: Past/current experiences, Profession, and Time since onset of injury/illness/exacerbation are also affecting patient's functional outcome.    GOALS: Goals reviewed  with patient? Yes  SHORT TERM GOALS: = LTG  LONG TERM GOALS: Target date: 03/28/2023  Patient will be I with final HEP to maintain progress from PT. Baseline: HEP provided at eval Goal status: INITIAL  2.  Patient will report >/= 60% status on FOTO to indicate improved functional ability. Baseline: 42% functional status Goal status: INITIAL  3.  Patient will demonstrate right shoulder flexion >/= 160 deg in order to improve overhead reach and ability to reach into high cabinets Baseline: 140 deg Goal status: INITIAL  4.  Patient will demonstrate right rotator cuff strength 5/5 MMT and periscapular strength >/= 4/5 MMT in order to improve her ability to perform repetitive tasks using the right arm and complete work related tasks Baseline: see limitations above Goal status: INITIAL  5.  Patient will be able to perform all self care and grooming tasks with </= 1/10 pain in order to reduce functional limitations Baseline: patient reports pain up to 8/10 Goal status: INITIAL   PLAN: PT FREQUENCY: 1-2x/week  PT DURATION: 8 weeks  PLANNED INTERVENTIONS: 97164- PT Re-evaluation, 97110-Therapeutic exercises, 97530- Therapeutic activity, 97112- Neuromuscular re-education, 97535- Self Care, [id number]- Manual therapy, 97014- Electrical stimulation (unattended), [id number]- Electrical stimulation (manual), 97033- Ionotophoresis 4mg /ml Dexamethasone, Patient/Family education, Taping, Dry Needling, Joint mobilization, Joint manipulation, Spinal manipulation, Spinal mobilization, Cryotherapy, and Moist heat  PLAN FOR NEXT SESSION: Review HEP and progress PRN, manual/TPDN for right cervical and shoulder region, right shoulder mobs, progress right shoulder AROM, progress rotator cuff and periscapular strengthening, rhythmic stabilization exercises   Rosana Hoes, PT, DPT, LAT, ATC 02/23/23  8:52 AM Phone: [phone] Fax: [phone]

## 2023-02-23 ENCOUNTER — Encounter: Payer: Self-pay | Admitting: Physical Therapy

## 2023-02-23 ENCOUNTER — Ambulatory Visit: Payer: PRIVATE HEALTH INSURANCE | Attending: Family Medicine | Admitting: Physical Therapy

## 2023-02-23 ENCOUNTER — Other Ambulatory Visit: Payer: Self-pay

## 2023-02-23 DIAGNOSIS — M6281 Muscle weakness (generalized): Secondary | ICD-10-CM | POA: Insufficient documentation

## 2023-02-23 DIAGNOSIS — M542 Cervicalgia: Secondary | ICD-10-CM | POA: Insufficient documentation

## 2023-02-23 DIAGNOSIS — M25511 Pain in right shoulder: Secondary | ICD-10-CM | POA: Diagnosis present

## 2023-02-23 DIAGNOSIS — G8929 Other chronic pain: Secondary | ICD-10-CM | POA: Diagnosis present

## 2023-02-26 ENCOUNTER — Other Ambulatory Visit: Payer: Self-pay

## 2023-02-26 ENCOUNTER — Ambulatory Visit: Payer: PRIVATE HEALTH INSURANCE | Attending: Family Medicine | Admitting: Physical Therapy

## 2023-02-26 ENCOUNTER — Encounter: Payer: Self-pay | Admitting: Physical Therapy

## 2023-02-26 DIAGNOSIS — M25511 Pain in right shoulder: Secondary | ICD-10-CM | POA: Diagnosis present

## 2023-02-26 DIAGNOSIS — M6281 Muscle weakness (generalized): Secondary | ICD-10-CM | POA: Diagnosis present

## 2023-02-26 DIAGNOSIS — G8929 Other chronic pain: Secondary | ICD-10-CM | POA: Insufficient documentation

## 2023-02-26 DIAGNOSIS — M542 Cervicalgia: Secondary | ICD-10-CM | POA: Insufficient documentation

## 2023-02-26 NOTE — Therapy (Signed)
OUTPATIENT PHYSICAL THERAPY TREATMENT   Patient Name: Brenda Mcmahon MRN: [id number] DOB:1990-11-10, 32 y.o., female Today's Date: 02/26/2023   END OF SESSION:  PT End of Session - 02/26/23 0935     Visit Number 3    Number of Visits 12    Date for PT Re-Evaluation 03/28/23    Authorization Type Worker's Comp    Authorization - Visit Number 3    Authorization - Number of Visits 8    PT Start Time 0930    PT Stop Time 1015    PT Time Calculation (min) 45 min    Activity Tolerance Patient tolerated treatment well    Behavior During Therapy WFL for tasks assessed/performed               Past Medical History:  Diagnosis Date   Palpitation    Past Surgical History:  Procedure Laterality Date   FOOT FRACTURE SURGERY Right 2015   also ankle fracture repaired.    WRIST FRACTURE SURGERY Left    32 yo.  fell through glass door.    Patient Active Problem List   Diagnosis Date Noted   Other insomnia 10/14/2022   Routine medical exam 09/18/2021   Need for DTaP vaccine 09/18/2021   Moderate recurrent major depression (HCC) 08/07/2021   GAD (generalized anxiety disorder) 08/07/2021   Encounter for other contraceptive management 08/07/2021   Palpitation 11/10/2020   Achilles bursitis of right lower extremity 03/14/2017   Recurrent major depression-severe (HCC) 03/14/2017    PCP: Blane Ohara, MD  REFERRING PROVIDER: Jene Every, MD   REFERRING DIAG: Pain in right shoulder   THERAPY DIAG:  Chronic right shoulder pain  Cervicalgia  Muscle weakness (generalized)  Rationale for Evaluation and Treatment: Rehabilitation  ONSET DATE: 06/30/2022   SUBJECTIVE:                                                                                                                                                                                     SUBJECTIVE STATEMENT: Patient reports she continues to get sharp pains in the shoulder and she didn't do anything to cause her  shoulder to hurt. She did wash her hair and that may have aggravated her shoulder.  EVAL: Patient previously in PT due to work related injury resulting in neck and right shoulder pain. She stopped therapy due to getting an MRI for the neck and shoulder on 11/30/2022. She returns to PT with continued right shoulder pain. She did get an injection 01/23/2023 that patient reports made the right shoulder feel worse. States the injection helped for about two hours and then by that night the pain was back. Patient locates  pain on the outside of the shoulder and she does report continued right sided neck tightness, which has improved since her last stint in therapy. She reports majority of symptoms occur with lifting arm out to the side and reaching up, and she avoids reaching behind her back. She has difficulty with washing hair, doing laundry or hanging clothes, reaching up in cabinets, she is unable to drive using the right arm, and she been out of work because she is unable to complete work related tasks.   Hand dominance: Ambidextrous  PERTINENT HISTORY: See PMH above  PAIN:  Are you having pain? Yes:  NPRS scale: 9/10 Worst: 9/10 with activity Pain location: Right shoulder Pain description: Pins and needles  Aggravating factors: Lifting right arm out to the side, reaching overhead, any activity that requires use of right arm Relieving factors: Heating pad  PRECAUTIONS: Patient reports she is still on 5 lbs lifting, pushing, pulling restriction  PATIENT GOALS: Pain relief, be able to use the right arm, return to prior level of function  NEXT MD VISIT: 02/20/2023   OBJECTIVE:  Note: Objective measures were completed at Evaluation unless otherwise noted. PATIENT SURVEYS:  FOTO 42% functional status  POSTURE: Rounded shoulder posture  CERVICAL ROM:   Active ROM A/PROM (deg) eval  Flexion 65  Extension 65  Right lateral flexion 40  Left lateral flexion 45  Right rotation 75  Left  rotation 80   (Blank rows = not tested)  UPPER EXTREMITY ROM:   Active ROM Right eval Left eval Right 02/23/2023  Shoulder flexion 140 160 148  Shoulder extension 40 70   Shoulder abduction 135 180   Shoulder adduction     Shoulder internal rotation L5 T6   Shoulder external rotation 85 / T1 85 / T5   Elbow flexion [facility] WFL   Elbow extension [facility] WFL   Wrist flexion     Wrist extension     Wrist ulnar deviation     Wrist radial deviation     Wrist pronation     Wrist supination     (Blank rows = not tested)  Right shoulder PROM grossly WFL but with pain at end range primarily in shoulder flexion, abduction, and IR  UPPER EXTREMITY MMT:  MMT Right eval Left eval Right 02/23/2023  Shoulder flexion 4 5   Shoulder scaption 4 5   Shoulder extension 5 5   Shoulder abduction 4 5   Shoulder adduction     Shoulder internal rotation 5 5   Shoulder external rotation 4 5 4   Middle trapezius 4- 4 4-  Lower trapezius 3+ 4 3+  Serratus anterior 4 5   Elbow flexion 5 5   Elbow extension 5 5   Wrist flexion     Wrist extension     Wrist ulnar deviation     Wrist radial deviation     Wrist pronation     Wrist supination     Grip strength (lbs)     (Blank rows = not tested)  SHOULDER SPECIAL TESTS: Impingement tests: Hawkins/Kennedy impingement test: positive  SLAP lesions: Negative Instability tests: Negative Rotator cuff assessment: Negative Biceps assessment: Negative  JOINT MOBILITY TESTING:  Hypomobility of right GHJ primarily posterior and inferior direction  PALPATION:  Tender to palpation with palpable trigger points right middle deltoid, infraspinatus and teres minor, subscap, and latissimus dorsi     TODAY'S TREATMENT:    [facility] Adult PT Treatment:  DATE: 02/26/2023 Therapeutic Exercise: UBE L1 x 4 min (fwd/bwd) while taking subjective Supine rhythmic stabilization at 60 and 90 deg flexion with manual pressure at  wrist 3 x 30 sec each Standing shoulder isometrics shoulder flexion, abduction, extension, ER, IR 10 x 5 sec each Prone I 10 x 5 sec Supine serratus punch 2 x 10 Sidelying shoulder ER 2 x 10 Sidelying shoulder flexion 2 x 10 Standing wall slide x 10 Manual: Right shoulder mobs primarily posterior and inferior at various ranges of elevation Right shoulder PROM all directions   [facility] Adult PT Treatment:                                                DATE: 02/23/2023 Therapeutic Exercise: UBE L1 x 4 min (fwd/bwd) while taking subjective Prone I and T with 1# edge of table x 10 each on right Prone Y edge of table x 10 on right Sidelying shoulder flexion with 1# x 10 on right Sidelying ER with 1# 2 x 10 Supine rhythmic stabilization at 90 deg flexion with manual pressure at wrist 3 x 30 sec Manual: Right shoulder mobs primarily posterior and inferior at various ranges of elevation Right shoulder PROM all directions Prone thoracic PA thrust manipulation Seated CT junction thrust manipulation  [facility] Adult PT Treatment:                                                DATE: 02/14/2023 Therapeutic Exercise: Sidelying ER with 2# x 10 Supine serrrtus punch with 2# x 10 Prone I x 10 Trigger Point Dry Needling Treatment: Pre-treatment instruction: Patient instructed on dry needling rationale, procedures, and possible side effects including pain during treatment (achy,cramping feeling), bruising, drop of blood, lightheadedness, nausea, sweating. Patient Consent Given: Yes Education handout provided: Previously provided Muscles treated: Right middle deltoid, upper trap, infraspinatus, subscapularis  Needle size and number: .30x59mm x 2 and .20x63mm x 4 Electrical stimulation performed: No Parameters: N/A Treatment response/outcome: Twitch response elicited and Palpable decrease in muscle tension Post-treatment instructions: Patient instructed to expect possible mild to moderate muscle soreness  later today and/or tomorrow. Patient instructed in methods to reduce muscle soreness and to continue prescribed HEP. If patient was dry needled over the lung field, patient was instructed on signs and symptoms of pneumothorax and, however unlikely, to see immediate medical attention should they occur. Patient was also educated on signs and symptoms of infection and to seek medical attention should they occur. Patient verbalized understanding of these instructions and education.  PATIENT EDUCATION: Education details: HEP Person educated: Patient Education method: Solicitor, Actor cues, Verbal cues Education comprehension: verbalized understanding, returned demonstration, verbal cues required, tactile cues required, and needs further education  HOME EXERCISE PROGRAM: Access Code: [email or web address]    ASSESSMENT: CLINICAL IMPRESSION: Patient tolerated therapy well with no adverse effects. She arrives reporting increased right shoulder pain. Therapy incorporated isometrics for the right shoulder this visit and continued with rotator cuff and periscapular exercises without any added resistance due to higher pain level this visit. She was able to complete all prescribed exercises but does report continued lateral shoulder pain consistent with rotator cuff tendinopathy referral. No changes to HEP this visit. Patient would benefit from continued  skilled PT to progress her right shoulder mobility and strength in order to reduce pain and maximize functional ability.   EVAL: Patient is a 32 y.o. female who was seen today for physical therapy evaluation and treatment for chronic right shoulder pain and neck tightness. Her symptoms seem consistent with right rotator cuff tendinopathy. She demonstrates limitations in all planes of right shoulder motion with pain, strength deficits of the right rotator cuff and periscapular musculature, postural deficits, good cervical ROM but with tightness noted on  right side, all impacting her functional ability.    OBJECTIVE IMPAIRMENTS: decreased activity tolerance, decreased ROM, decreased strength, impaired flexibility, postural dysfunction, and pain.   ACTIVITY LIMITATIONS: carrying, lifting, sleeping, dressing, reach over head, and hygiene/grooming  PARTICIPATION LIMITATIONS: meal prep, cleaning, and occupation  PERSONAL FACTORS: Past/current experiences, Profession, and Time since onset of injury/illness/exacerbation are also affecting patient's functional outcome.    GOALS: Goals reviewed with patient? Yes  SHORT TERM GOALS: = LTG  LONG TERM GOALS: Target date: 03/28/2023  Patient will be I with final HEP to maintain progress from PT. Baseline: HEP provided at eval Goal status: INITIAL  2.  Patient will report >/= 60% status on FOTO to indicate improved functional ability. Baseline: 42% functional status Goal status: INITIAL  3.  Patient will demonstrate right shoulder flexion >/= 160 deg in order to improve overhead reach and ability to reach into high cabinets Baseline: 140 deg Goal status: INITIAL  4.  Patient will demonstrate right rotator cuff strength 5/5 MMT and periscapular strength >/= 4/5 MMT in order to improve her ability to perform repetitive tasks using the right arm and complete work related tasks Baseline: see limitations above Goal status: INITIAL  5.  Patient will be able to perform all self care and grooming tasks with </= 1/10 pain in order to reduce functional limitations Baseline: patient reports pain up to 8/10 Goal status: INITIAL   PLAN: PT FREQUENCY: 1-2x/week  PT DURATION: 8 weeks  PLANNED INTERVENTIONS: 97164- PT Re-evaluation, 97110-Therapeutic exercises, 97530- Therapeutic activity, 97112- Neuromuscular re-education, 97535- Self Care, 47829- Manual therapy, 97014- Electrical stimulation (unattended), [id number]- Electrical stimulation (manual), 97033- Ionotophoresis 4mg /ml Dexamethasone,  Patient/Family education, Taping, Dry Needling, Joint mobilization, Joint manipulation, Spinal manipulation, Spinal mobilization, Cryotherapy, and Moist heat  PLAN FOR NEXT SESSION: Review HEP and progress PRN, manual/TPDN for right cervical and shoulder region, right shoulder mobs, progress right shoulder AROM, progress rotator cuff and periscapular strengthening, rhythmic stabilization exercises   Rosana Hoes, PT, DPT, LAT, ATC 02/26/23  10:15 AM Phone: [phone] Fax: [phone]

## 2023-02-28 ENCOUNTER — Ambulatory Visit: Payer: PRIVATE HEALTH INSURANCE | Admitting: Physical Therapy

## 2023-02-28 ENCOUNTER — Encounter: Payer: Self-pay | Admitting: Family Medicine

## 2023-03-01 ENCOUNTER — Ambulatory Visit: Payer: Self-pay | Admitting: Orthopedic Surgery

## 2023-03-05 DIAGNOSIS — F332 Major depressive disorder, recurrent severe without psychotic features: Secondary | ICD-10-CM | POA: Diagnosis not present

## 2023-03-05 DIAGNOSIS — F4312 Post-traumatic stress disorder, chronic: Secondary | ICD-10-CM | POA: Diagnosis not present

## 2023-03-06 ENCOUNTER — Ambulatory Visit: Payer: 59 | Admitting: Physical Therapy

## 2023-03-07 ENCOUNTER — Ambulatory Visit: Payer: Self-pay | Admitting: Orthopedic Surgery

## 2023-03-07 NOTE — H&P (View-Only) (Signed)
 Brenda Mcmahon is an 32 y.o. female.   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    WRIST FRACTURE SURGERY Left    32 yo.  fell through glass door.     Family History  Problem Relation Age of Onset   Seizures Mother    Hypotension Mother    Asthma Brother    Seizures Maternal Grandfather    Social History:  reports that she has never smoked. She has never used smokeless tobacco. She reports that she does not drink alcohol and does not use drugs.  Allergies:  Allergies  Allergen Reactions   Hydrocodone Anaphylaxis   Kale Anaphylaxis    Hives   Oxycodone Anaphylaxis   Lactose Intolerance (Gi)    Hydrocodone-Acetaminophen Nausea And Vomiting    Current meds: acebutoloL 200 mg capsule lidocaine 5 % topical patch Mobic 15 mg tablet tiZANidine 4 mg capsule  Review of Systems  Constitutional: Negative.   HENT: Negative.    Eyes: Negative.   Respiratory: Negative.    Cardiovascular: Negative.   Gastrointestinal: Negative.   Endocrine: Negative.   Genitourinary: Negative.   Musculoskeletal:  Positive for arthralgias and myalgias.  Skin: Negative.   Neurological: Negative.     There were no vitals taken for this visit. Physical Exam Constitutional:      Appearance: Normal appearance.  HENT:     Head: Normocephalic  and atraumatic.     Right Ear: External ear normal.     Left Ear: External ear normal.     Nose: Nose normal.     Mouth/Throat:     Pharynx: Oropharynx is clear.  Eyes:     Conjunctiva/sclera: Conjunctivae normal.  Cardiovascular:     Rate and Rhythm: Normal rate and regular rhythm.     Pulses: Normal pulses.     Heart sounds: Normal heart sounds.  Pulmonary:     Effort: Pulmonary effort is normal.     Breath sounds: Normal breath sounds.  Abdominal:     General: Bowel sounds are normal.  Musculoskeletal:     Cervical back: Normal range of motion.     Comments: Constitutional: General Appearance: healthy-appearing and NAD.  Psychiatric: Mood and Affect: normal mood and affect.  Cardiovascular System: Arterial Pulses Right: radial normal and brachial normal. Varicosities Right: no varicosities.  C-Spine/Neck: Active Range of Motion: flexion normal, extension normal, and no pain elicited on motion.  Shoulders: Inspection Right: no misalignment, atrophy, erythema, swelling, or scapular winging. Bony Palpation Right: no tenderness of the sternoclavicular joint, the coracoid process, the acromioclavicular joint, the bicipital groove, or the scapula. Soft Tissue Palpation Right: tenderness of the supraspinatus and the subacromial bursa. Active Range of Motion Right: limited. Special Tests Right: Speed's test negative and Neer's test positive. Stability Right: no laxity, sulcus sign negative, and anterior apprehension test negative. Strength Right: abduction 5/5, adduction 5/5, flexion 5/5, and extension 5/5.  Skin: Right  Upper Extremity: normal.  Neurological System: Biceps Reflex Right: normal (2). Brachioradialis Reflex Right: normal (2). Triceps Reflex Right: normal (2). Sensation on the Right: C5 normal, C6 normal, and C7 normal.  Skin:    General: Skin is warm and dry.  Neurological:     Mental Status: She is alert.    MRI of the right shoulder demonstrates mild tendinitis of the  supraspinatus tendon with a partial-thickness bursal surface tear anteriorly. This is an outside MRI independently reviewed by myself MRI cervical spine is mild bulging disks from C3-4 to C6-7. No foraminal encroachment or nerve root impingement Three-view x-rays of the cervical spine demonstrate no evidence of fracture or significant disc degeneration.  Assessment/Plan Impression:  1. 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 Occipitally mediated headaches 3. Impingement pain of the right shoulder with partial tear of the rotator cuff persistent  Plan:  Discussed options including repeat corticosteroid injection or living with her symptoms. She does not want injections nor does she want to live with her symptoms I discussed today shoulder arthroscopy subacromial decompression debridement the rotator cuff with a slight possibility of rotator cuff repair if noted intraoperatively. She would like to proceed with that.  I had an extensive discussion with the patient concerning her pathology and relevant anatomy. To the persistence of their symptoms despite conservative treatment to include an exercise program, activity modification and injections we discussed proceeding with a shoulder arthroscopy. Discussed the procedure in detail including the risks and benefits of improvement in symptoms, worsening of their symptoms, no changes in her symptoms. I discussed decompressing the subacromial space by a bursectomy and CA ligament release as well as acromioplasty if needed. Additionally discussed evaluating the labrum and biceps if appropriate. In addition I discussed the possibility that if an occult tear of the rotator cuff is noted that it may require a mini open rotator cuff repair. In addition discussed the outpatient procedure. Follow-up in 2 weeks. Pendulum exercises and appropriate postoperative analgesics.   We discussed a home  exercise program as well as strategies to avoid impingement such as avoiding activities with the elbow above the shoulder, behind the back, repetitive circular motions and overhead lifting.  Patient was given a prescription for an anti-inflammatory previously or instructed to take over-the-counter anti-inflammatory medications. Side effects were discussed including potential elevation of blood pressure, long-term effect on the kidneys and liver, ulcer risks and therefore the need for appropriate monitoring if taken continually. That would include regular blood chemistries by a primary care physician to evaluate kidney function as well as liver function and monitor for potential gastrointestinal effects. My preference is to utilize anti-inflammatories periodically and preemptively to reduce inflammation on a short-term basis. This would decrease the risks associated with long-term use of those medications. In addition anti-inflammatory medications should be taken with a meal. They should not be taken if a blood thinner is being used or the patient has a history of peptic ulcer disease.  Continue using her TENS unit  I discussed these findings in front of the patient with their case manager. Marcelino Duster Domin[place]'s I summarized the encounter including the patient's complaints, progress to date, and current recommendations for future treatment and follow-up. In addition, we discussed work restrictions and the patient's full duty job description.   Continue her restricted duty.  Will have her scheduled for that procedure. No history of MRSA or DVT. No preoperative clearance required. Kefzol. Oxycodone.  Plan R shoulder scope,  SAD, debridement, possible mini-open RCR  Dorothy Spark, PA-C for Dr Shelle Iron 03/07/2023, 1:27 PM

## 2023-03-07 NOTE — H&P (Signed)
Brenda Mcmahon is an 32 y.o. female.   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    WRIST FRACTURE SURGERY Left    32 yo.  fell through glass door.     Family History  Problem Relation Age of Onset   Seizures Mother    Hypotension Mother    Asthma Brother    Seizures Maternal Grandfather    Social History:  reports that she has never smoked. She has never used smokeless tobacco. She reports that she does not drink alcohol and does not use drugs.  Allergies:  Allergies  Allergen Reactions   Hydrocodone Anaphylaxis   Kale Anaphylaxis    Hives   Oxycodone Anaphylaxis   Lactose Intolerance (Gi)    Hydrocodone-Acetaminophen Nausea And Vomiting    Current meds: acebutoloL 200 mg capsule lidocaine 5 % topical patch Mobic 15 mg tablet tiZANidine 4 mg capsule  Review of Systems  Constitutional: Negative.   HENT: Negative.    Eyes: Negative.   Respiratory: Negative.    Cardiovascular: Negative.   Gastrointestinal: Negative.   Endocrine: Negative.   Genitourinary: Negative.   Musculoskeletal:  Positive for arthralgias and myalgias.  Skin: Negative.   Neurological: Negative.     There were no vitals taken for this visit. Physical Exam Constitutional:      Appearance: Normal appearance.  HENT:     Head: Normocephalic  and atraumatic.     Right Ear: External ear normal.     Left Ear: External ear normal.     Nose: Nose normal.     Mouth/Throat:     Pharynx: Oropharynx is clear.  Eyes:     Conjunctiva/sclera: Conjunctivae normal.  Cardiovascular:     Rate and Rhythm: Normal rate and regular rhythm.     Pulses: Normal pulses.     Heart sounds: Normal heart sounds.  Pulmonary:     Effort: Pulmonary effort is normal.     Breath sounds: Normal breath sounds.  Abdominal:     General: Bowel sounds are normal.  Musculoskeletal:     Cervical back: Normal range of motion.     Comments: Constitutional: General Appearance: healthy-appearing and NAD.  Psychiatric: Mood and Affect: normal mood and affect.  Cardiovascular System: Arterial Pulses Right: radial normal and brachial normal. Varicosities Right: no varicosities.  C-Spine/Neck: Active Range of Motion: flexion normal, extension normal, and no pain elicited on motion.  Shoulders: Inspection Right: no misalignment, atrophy, erythema, swelling, or scapular winging. Bony Palpation Right: no tenderness of the sternoclavicular joint, the coracoid process, the acromioclavicular joint, the bicipital groove, or the scapula. Soft Tissue Palpation Right: tenderness of the supraspinatus and the subacromial bursa. Active Range of Motion Right: limited. Special Tests Right: Speed's test negative and Neer's test positive. Stability Right: no laxity, sulcus sign negative, and anterior apprehension test negative. Strength Right: abduction 5/5, adduction 5/5, flexion 5/5, and extension 5/5.  Skin: Right  Upper Extremity: normal.  Neurological System: Biceps Reflex Right: normal (2). Brachioradialis Reflex Right: normal (2). Triceps Reflex Right: normal (2). Sensation on the Right: C5 normal, C6 normal, and C7 normal.  Skin:    General: Skin is warm and dry.  Neurological:     Mental Status: She is alert.    MRI of the right shoulder demonstrates mild tendinitis of the  supraspinatus tendon with a partial-thickness bursal surface tear anteriorly. This is an outside MRI independently reviewed by myself MRI cervical spine is mild bulging disks from C3-4 to C6-7. No foraminal encroachment or nerve root impingement Three-view x-rays of the cervical spine demonstrate no evidence of fracture or significant disc degeneration.  Assessment/Plan Impression:  1. 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 Occipitally mediated headaches 3. Impingement pain of the right shoulder with partial tear of the rotator cuff persistent  Plan:  Discussed options including repeat corticosteroid injection or living with her symptoms. She does not want injections nor does she want to live with her symptoms I discussed today shoulder arthroscopy subacromial decompression debridement the rotator cuff with a slight possibility of rotator cuff repair if noted intraoperatively. She would like to proceed with that.  I had an extensive discussion with the patient concerning her pathology and relevant anatomy. To the persistence of their symptoms despite conservative treatment to include an exercise program, activity modification and injections we discussed proceeding with a shoulder arthroscopy. Discussed the procedure in detail including the risks and benefits of improvement in symptoms, worsening of their symptoms, no changes in her symptoms. I discussed decompressing the subacromial space by a bursectomy and CA ligament release as well as acromioplasty if needed. Additionally discussed evaluating the labrum and biceps if appropriate. In addition I discussed the possibility that if an occult tear of the rotator cuff is noted that it may require a mini open rotator cuff repair. In addition discussed the outpatient procedure. Follow-up in 2 weeks. Pendulum exercises and appropriate postoperative analgesics.   We discussed a home  exercise program as well as strategies to avoid impingement such as avoiding activities with the elbow above the shoulder, behind the back, repetitive circular motions and overhead lifting.  Patient was given a prescription for an anti-inflammatory previously or instructed to take over-the-counter anti-inflammatory medications. Side effects were discussed including potential elevation of blood pressure, long-term effect on the kidneys and liver, ulcer risks and therefore the need for appropriate monitoring if taken continually. That would include regular blood chemistries by a primary care physician to evaluate kidney function as well as liver function and monitor for potential gastrointestinal effects. My preference is to utilize anti-inflammatories periodically and preemptively to reduce inflammation on a short-term basis. This would decrease the risks associated with long-term use of those medications. In addition anti-inflammatory medications should be taken with a meal. They should not be taken if a blood thinner is being used or the patient has a history of peptic ulcer disease.  Continue using her TENS unit  I discussed these findings in front of the patient with their case manager. Marcelino Duster [place]'s I summarized the encounter including the patient's complaints, progress to date, and current recommendations for future treatment and follow-up. In addition, we discussed work restrictions and the patient's full duty job description.   Continue her restricted duty.  Will have her scheduled for that procedure. No history of MRSA or DVT. No preoperative clearance required. Kefzol. Oxycodone.  Plan R shoulder scope,  SAD, debridement, possible mini-open RCR  Dorothy Spark, PA-C for Dr Shelle Iron 03/07/2023, 1:27 PM

## 2023-03-08 ENCOUNTER — Encounter: Payer: 59 | Admitting: Physical Therapy

## 2023-03-12 DIAGNOSIS — F4312 Post-traumatic stress disorder, chronic: Secondary | ICD-10-CM | POA: Diagnosis not present

## 2023-03-12 DIAGNOSIS — F332 Major depressive disorder, recurrent severe without psychotic features: Secondary | ICD-10-CM | POA: Diagnosis not present

## 2023-03-12 NOTE — Progress Notes (Signed)
Subjective:  Patient ID: Brenda Mcmahon, female    DOB: [birth year]  Age: 32 y.o. MRN: [id number]  Chief Complaint  Patient presents with   Anxiety    HPI GAD/Depression: Taking Pristiq 50 mg daily. The patient, with a history of severe depression, presents after a recent psychiatric consultation. She has been attending weekly therapy sessions and is due for a follow-up with her psychiatrist on December 4th. She reports feeling 'mentally unstable' and has been advised by her psychiatrist to take time off work to focus on her mental health.  The patient is currently on Pristiq, which she believes is helping, as she notices a significant decline in her mental state when she forgets to take it. However, she still experiences symptoms of severe depression, including lack of interest in daily activities, disrupted sleep patterns, low energy, poor appetite, and restlessness. She denies active suicidal ideation but expresses a desire to isolate herself from others.  In addition to her mental health concerns, the patient is also dealing with a workman's comp related right shoulder injury, for which she is scheduled to have surgery on December 11th. She also has ongoing responsibilities caring for her mother, who has significant chronic medical issues.     03/13/2023    8:00 AM 12/12/2022    3:47 PM 11/07/2022    4:05 PM 10/10/2022    2:29 PM 09/13/2021   11:19 AM  Depression screen PHQ 2/9  Decreased Interest 3 1 2 2 1   Down, Depressed, Hopeless 3 2 2 2 1   PHQ - 2 Score 6 3 4 4 2   Altered sleeping 3 2 2 3 3   Tired, decreased energy 3 1 2 3 1   Change in appetite 3 2 2 3 1   Feeling bad or failure about yourself  3 2 2 3 1   Trouble concentrating 3 1 2 3 1   Moving slowly or fidgety/restless 3 1 2 3 2   Suicidal thoughts 3 1 2 3  0  PHQ-9 Score 27 13 18 25 11   Difficult doing work/chores Extremely dIfficult Somewhat difficult Somewhat difficult Very difficult Somewhat difficult         03/13/2023    8:00 AM  Fall Risk   Falls in the past year? 0  Number falls in past yr: 0  Injury with Fall? 0  Risk for fall due to : No Fall Risks  Follow up Falls evaluation completed    Patient Care Team: Blane Ohara, MD as PCP - General (Family Medicine)   Review of Systems  Constitutional:  Negative for chills, fatigue and fever.  HENT:  Negative for congestion, ear pain and sore throat.   Respiratory:  Negative for cough and shortness of breath.   Cardiovascular:  Negative for chest pain.  Gastrointestinal:  Negative for abdominal pain, constipation, diarrhea, nausea and vomiting.  Genitourinary:  Negative for dysuria and urgency.  Musculoskeletal:  Positive for arthralgias (shoulder pain). Negative for myalgias.  Skin:  Negative for rash.  Neurological:  Negative for dizziness and headaches.  Psychiatric/Behavioral:  Positive for decreased concentration, dysphoric mood, sleep disturbance and suicidal ideas (not actively suicidal.). The patient is nervous/anxious.     Current Outpatient Medications on File Prior to Visit  Medication Sig Dispense Refill   acetaminophen (TYLENOL) 500 MG tablet Take 1,000 mg by mouth every 6 (six) hours as needed for moderate pain (pain score 4-6).     fluticasone (FLONASE) 50 MCG/ACT nasal spray Place 2 sprays into both nostrils daily. 16 g 6  zolpidem (AMBIEN) 5 MG tablet Take 1 tablet (5 mg total) by mouth at bedtime as needed for sleep. 30 tablet 2   acebutolol (SECTRAL) 200 MG capsule Take 1 capsule (200 mg total) by mouth 2 (two) times daily. 180 capsule 3   meloxicam (MOBIC) 15 MG tablet Take 1 tablet (15 mg total) by mouth daily as needed. Take with a meal. (Patient not taking: Reported on 03/13/2023) 30 tablet 1   tiZANidine (ZANAFLEX) 4 MG capsule Take 1 capsule (4 mg total) by mouth daily as needed. (Patient not taking: Reported on 03/13/2023) 30 capsule 1   No current facility-administered medications on file prior to visit.    Past Medical History:  Diagnosis Date   Palpitation    Past Surgical History:  Procedure Laterality Date   FOOT FRACTURE SURGERY Right 2015   also ankle fracture repaired.    WRIST FRACTURE SURGERY Left    32 yo.  fell through glass door.     Family History  Problem Relation Age of Onset   Seizures Mother    Hypotension Mother    Asthma Brother    Seizures Maternal Grandfather    Social History   Socioeconomic History   Marital status: Single    Spouse name: Not on file   Number of children: Not on file   Years of education: Not on file   Highest education level: Not on file  Occupational History   Occupation: Pharmacologist    Employer: [employer]  Tobacco Use   Smoking status: Never   Smokeless tobacco: Never  Vaping Use   Vaping status: Never Used  Substance and Sexual Activity   Alcohol use: No   Drug use: Never   Sexual activity: Not on file  Other Topics Concern   Not on file  Social History Narrative   Not on file   Social Determinants of Health   Financial Resource Strain: Low Risk  (09/13/2021)   Overall Financial Resource Strain (CARDIA)    Difficulty of Paying Living Expenses: Not very hard  Food Insecurity: No Food Insecurity (09/13/2021)   Hunger Vital Sign    Worried About Running Out of Food in the Last Year: Never true    Ran Out of Food in the Last Year: Never true  Transportation Needs: No Transportation Needs (09/13/2021)   PRAPARE - Administrator, Civil Service (Medical): No    Lack of Transportation (Non-Medical): No  Physical Activity: Inactive (10/10/2022)   Exercise Vital Sign    Days of Exercise per Week: 0 days    Minutes of Exercise per Session: 0 min  Stress: No Stress Concern Present (10/10/2022)   [employer] of Occupational Health - Occupational Stress Questionnaire    Feeling of Stress : Not at all  Social Connections: Moderately Isolated (10/10/2022)   Social Connection and Isolation Panel [NHANES]     Frequency of Communication with Friends and Family: Three times a week    Frequency of Social Gatherings with Friends and Family: Three times a week    Attends Religious Services: More than 4 times per year    Active Member of Clubs or Organizations: No    Attends Banker Meetings: Never    Marital Status: Never married    Objective:  BP 102/62 (BP Location: Right Arm, Patient Position: Sitting, Cuff Size: Normal)   Pulse 82   Temp 98.7 F (37.1 C) (Temporal)   Resp 16   Ht 5\' 1"  (1.549 m)  Wt 121 lb 12.8 oz (55.2 kg)   LMP 02/19/2023 (Exact Date)   SpO2 99%   BMI 23.01 kg/m      03/17/2023    9:01 AM 03/17/2023    8:59 AM 03/13/2023    7:57 AM  BP/Weight  Systolic BP  123 161  Diastolic BP  75 62  Wt. (Lbs) 120  121.8  BMI 21.95 kg/m2  23.01 kg/m2    Physical Exam Vitals reviewed.  Constitutional:      Appearance: Normal appearance. She is normal weight.  Neck:     Vascular: No carotid bruit.  Cardiovascular:     Rate and Rhythm: Normal rate and regular rhythm.     Heart sounds: Normal heart sounds.  Pulmonary:     Effort: Pulmonary effort is normal. No respiratory distress.     Breath sounds: Normal breath sounds.  Abdominal:     Palpations: Abdomen is soft.  Neurological:     Mental Status: She is alert and oriented to person, place, and time.  Psychiatric:        Behavior: Behavior normal.     Comments: depressed     Diabetic Foot Exam - Simple   No data filed      Lab Results  Component Value Date   WBC 6.7 10/10/2022   HGB 12.3 10/10/2022   HCT 37.8 10/10/2022   PLT 197 10/10/2022   GLUCOSE 84 09/13/2021   CHOL 160 10/10/2022   TRIG 91 10/10/2022   HDL 63 10/10/2022   LDLCALC 80 10/10/2022   ALT 14 09/13/2021   AST 20 09/13/2021   NA 137 09/13/2021   K 4.5 09/13/2021   CL 102 09/13/2021   CREATININE 0.88 09/13/2021   BUN 7 09/13/2021   CO2 22 09/13/2021   TSH 1.530 10/10/2022      Assessment & Plan:    GAD  (generalized anxiety disorder) Assessment & Plan: Continue Pristiq to 50 mg daily. Continue counseling.   Orders: -     Desvenlafaxine Succinate ER; Take 1 tablet (100 mg total) by mouth every morning.  Dispense: 90 tablet; Refill: 0  Severe episode of recurrent major depressive disorder, without psychotic features (HCC) Assessment & Plan: Severe depression with daily symptoms. Currently on Pristiq with some perceived benefit but PHQ-9 score remains high. Psychiatrist recommended increasing Pristiq to 100mg  daily and patient is in agreement. -Increase Pristiq to 100mg  daily. -Continue therapy sessions. -Transfer psychiatric medication management to psychiatrist.    Caregiver for Mother Patient has FMLA for her mother who has significant chronic medical issues. Patient provides psychological support and assistance during hospitalizations and at home. -Continue current caregiving responsibilities as able.  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 There is confusion regarding paperwork for FMLA and ADA accommodations. -Advise patient to have psychiatrist complete paperwork related to mental health leave. -Complete necessary paperwork related to patient's physical health conditions.  Orders: -     Desvenlafaxine Succinate ER; Take 1 tablet (100 mg total) by mouth every morning.  Dispense: 90 tablet; Refill: 0  Chronic right shoulder pain Assessment & Plan: Scheduled for December 11th. Workman's comp related. Duration of medical leave to be determined by orthopedic surgeon. -Continue current plan as directed by orthopedic surgeon.     Meds ordered this encounter  Medications   desvenlafaxine (PRISTIQ) 100 MG 24 hr tablet    Sig: Take 1 tablet (100 mg total) by mouth every morning.  Dispense:  90 tablet    Refill:  0    No orders of the defined types were placed in this encounter.    Follow-up: Return in  about 5 weeks (around 04/16/2023) for chronic follow up.  Total time spent on today's visit was greater than 40 minutes, including both face-to-face time and nonface-to-face time personally spent on review of chart (labs and imaging), discussing labs and goals, discussing further work-up, treatment options, referrals to specialist if needed, reviewing outside records of pertinent, answering patient's questions, and coordinating care.  I,Marla I Leal-Borjas,acting as a scribe for Blane Ohara, MD.,have documented all relevant documentation on the behalf of Blane Ohara, MD,as directed by  Blane Ohara, MD while in the presence of Blane Ohara, MD.   An After Visit Summary was printed and given to the patient.  Blane Ohara, MD [facility] [phone]

## 2023-03-13 ENCOUNTER — Encounter: Payer: 59 | Admitting: Physical Therapy

## 2023-03-13 ENCOUNTER — Encounter: Payer: Self-pay | Admitting: Family Medicine

## 2023-03-13 ENCOUNTER — Other Ambulatory Visit (HOSPITAL_COMMUNITY): Payer: Self-pay

## 2023-03-13 ENCOUNTER — Ambulatory Visit (INDEPENDENT_AMBULATORY_CARE_PROVIDER_SITE_OTHER): Payer: 59 | Admitting: Family Medicine

## 2023-03-13 VITALS — BP 102/62 | HR 82 | Temp 98.7°F | Resp 16 | Ht 61.0 in | Wt 121.8 lb

## 2023-03-13 DIAGNOSIS — F332 Major depressive disorder, recurrent severe without psychotic features: Secondary | ICD-10-CM

## 2023-03-13 DIAGNOSIS — F411 Generalized anxiety disorder: Secondary | ICD-10-CM | POA: Diagnosis not present

## 2023-03-13 DIAGNOSIS — G8929 Other chronic pain: Secondary | ICD-10-CM

## 2023-03-13 DIAGNOSIS — M25511 Pain in right shoulder: Secondary | ICD-10-CM

## 2023-03-13 MED ORDER — DESVENLAFAXINE SUCCINATE ER 100 MG PO TB24
100.0000 mg | ORAL_TABLET | ORAL | 0 refills | Status: DC
  Filled 2023-03-13: qty 90, 90d supply, fill #0

## 2023-03-13 NOTE — Patient Instructions (Signed)
VISIT SUMMARY:  During today's visit, we discussed your ongoing mental health concerns, including your severe depression and the current treatment plan. We also reviewed your upcoming right shoulder surgery and your responsibilities as a caregiver for your mother. Additionally, we addressed the need for work accommodations due to your mental health and physical health conditions.  YOUR PLAN:  -MAJOR DEPRESSIVE DISORDER: Major Depressive Disorder is a mental health condition characterized by persistent feelings of sadness and loss of interest in daily activities. We will increase your Pristiq dosage to 100mg  daily, continue your therapy sessions, and transfer your psychiatric medication management to your psychiatrist.  -PENDING RIGHT SHOULDER SURGERY: You are scheduled for right shoulder surgery on December 11th due to a work-related injury. Please continue to follow the plan as directed by your orthopedic surgeon.  -CAREGIVER FOR MOTHER: You are providing care for your mother, who has significant chronic medical issues. Please continue your caregiving responsibilities as you are able.  -WORK ACCOMMODATIONS: Due to your mental health, your psychiatrist has advised you to take a leave from work. My preference is to have your psychiatrist complete the necessary paperwork for FMLA and ADA accommodations, but my understanding is we need to do this for you until you have seen her for 90 days. We will complete the paperwork related to your physical health conditions.  INSTRUCTIONS:  Please follow up with your psychiatrist on December 4th for your mental health management. Your right shoulder surgery is scheduled for December 11th, and the duration of your medical leave will be determined by your orthopedic surgeon. Ensure that all necessary paperwork for FMLA and ADA accommodations is completed by your psychiatrist and our office.

## 2023-03-14 NOTE — Progress Notes (Addendum)
COVID Vaccine received:  []  No [x]  Yes Date of any COVID positive Test in last 90 days: no PCP - Blane Ohara MD Cardiologist - Dr. Norman Herrlich  Chest x-ray -  EKG -   Stress Test -  ECHO -07/29/20 Epic  Cardiac Cath -   Bowel Prep - [x]  No  []   Yes ______  Pacemaker / ICD device [x]  No []  Yes   Spinal Cord Stimulator:[x]  No []  Yes       History of Sleep Apnea? [x]  No []  Yes   CPAP used?- [x]  No []  Yes    Does the patient monitor blood sugar?          [x]  No []  Yes  []  N/A  Patient has: [x]  NO Hx DM   []  Pre-DM                 []  DM1  []   DM2 Does patient have a [employer] or [employer]? []  No []  Yes   Fasting Blood Sugar Ranges-  Checks Blood Sugar _____ times a day  GLP1 agonist / usual dose - no GLP1 instructions:  SGLT-2 inhibitors / usual dose - no SGLT-2 instructions:   Blood Thinner / Instructions:no Aspirin Instructions:no Comments:   Activity level: Patient is able  to climb a flight of stairs without difficulty; [x]  No CP  [x]  No SOB, but would have ___   Patient can perform ADLs without assistance.   Anesthesia review: Murmur, Sinus tach  Patient denies shortness of breath, fever, cough and chest pain at PAT appointment.  Patient verbalized understanding and agreement to the Pre-Surgical Instructions that were given to them at this PAT appointment. Patient was also educated of the need to review these PAT instructions again prior to his/her surgery.I reviewed the appropriate phone numbers to call if they have any and questions or concerns.

## 2023-03-14 NOTE — Patient Instructions (Addendum)
SURGICAL WAITING ROOM VISITATION  Patients having surgery or a procedure may have no more than 2 support people in the waiting area - these visitors may rotate.    Children under the age of 59 must have an adult with them who is not the patient.  Due to an increase in RSV and influenza rates and associated hospitalizations, children ages 6 and under may not visit patients in [facility] hospitals.  If the patient needs to stay at the hospital during part of their recovery, the visitor guidelines for inpatient rooms apply. Pre-op nurse will coordinate an appropriate time for 1 support person to accompany patient in pre-op.  This support person may not rotate.    Please refer to the [facility] website for the visitor guidelines for Inpatients (after your surgery is over and you are in a regular room).       Your procedure is scheduled on: 03/28/23   Report to [facility] Main Entrance    Report to admitting at 9:15 AM   Call this number if you have problems the morning of surgery [phone]   Do not eat food :After Midnight.   After Midnight you may have the following liquids until 8:40 AM DAY OF SURGERY  Water Non-Citrus Juices (without pulp, NO RED-Apple, White grape, White cranberry) Black Coffee (NO MILK/CREAM OR CREAMERS, sugar ok)  Clear Tea (NO MILK/CREAM OR CREAMERS, sugar ok) regular and decaf                             Plain Jell-O (NO RED)                                           Fruit ices (not with fruit pulp, NO RED)                                     Popsicles (NO RED)                                                               Sports drinks like Gatorade (NO RED)                The day of surgery:  Drink ONE (1) Pre-Surgery Clear Ensure at 8:40 AM the morning of surgery. Drink in one sitting. Do not sip.  This drink was given to you during your hospital  pre-op appointment visit. Nothing else to drink after completing the  Pre-Surgery Clear  Ensure        Oral Hygiene is also important to reduce your risk of infection.                                    Remember - BRUSH YOUR TEETH THE MORNING OF SURGERY WITH YOUR REGULAR TOOTHPASTE   Stop all vitamins and herbal supplements 7 days before surgery.   Take these medicines the morning of surgery with A SIP OF WATER: Acebutolol, Pristiq, Tylenol if needed.  You may not have any metal on your body including hair pins, jewelry, and body piercing             Do not wear make-up, lotions, powders, perfumes/cologne, or deodorant  Do not wear nail polish including gel and S&S, artificial/acrylic nails, or any other type of covering on natural nails including finger and toenails. If you have artificial nails, gel coating, etc. that needs to be removed by a nail salon please have this removed prior to surgery or surgery may need to be canceled/ delayed if the surgeon/ anesthesia feels like they are unable to be safely monitored.   Do not shave  48 hours prior to surgery.    Do not bring valuables to the hospital. [facility] IS NOT             RESPONSIBLE   FOR VALUABLES.   Contacts, glasses, dentures or bridgework may not be worn into surgery.  DO NOT BRING YOUR HOME MEDICATIONS TO THE HOSPITAL. PHARMACY WILL DISPENSE MEDICATIONS LISTED ON YOUR MEDICATION LIST TO YOU DURING YOUR ADMISSION IN THE HOSPITAL!    Patients discharged on the day of surgery will not be allowed to drive home.  Someone NEEDS to stay with you for the first 24 hours after anesthesia.   Special Instructions: Bring a copy of your healthcare power of attorney and living will documents the day of surgery if you haven't scanned them before.              Please read over the following fact sheets you were given: IF YOU HAVE QUESTIONS ABOUT YOUR PRE-OP INSTRUCTIONS PLEASE CALL [phone] Brenda Mcmahon   If you received a COVID test during your pre-op visit  it is requested that you wear a mask when out in  public, stay away from anyone that may not be feeling well and notify your surgeon if you develop symptoms. If you test positive for Covid or have been in contact with anyone that has tested positive in the last 10 days please notify you surgeon.    [place] - Preparing for Surgery Before surgery, you can play an important role.  Because skin is not sterile, your skin needs to be as free of germs as possible.  You can reduce the number of germs on your skin by washing with CHG (chlorahexidine gluconate) soap before surgery.  CHG is an antiseptic cleaner which kills germs and bonds with the skin to continue killing germs even after washing. Please DO NOT use if you have an allergy to CHG or antibacterial soaps.  If your skin becomes reddened/irritated stop using the CHG and inform your nurse when you arrive at Short Stay. Do not shave (including legs and underarms) for at least 48 hours prior to the first CHG shower.  You may shave your face/neck.  Please follow these instructions carefully:  1.  Shower with CHG Soap the night before surgery and the  morning of surgery.  2.  If you choose to wash your hair, wash your hair first as usual with your normal  shampoo.  3.  After you shampoo, rinse your hair and body thoroughly to remove the shampoo.                             4.  Use CHG as you would any other liquid soap.  You can apply chg directly to the skin and wash.  Gently with a scrungie  or clean washcloth.  5.  Apply the CHG Soap to your body ONLY FROM THE NECK DOWN.   Do   not use on face/ open                           Wound or open sores. Avoid contact with eyes, ears mouth and   genitals (private parts).                       Wash face,  Genitals (private parts) with your normal soap.             6.  Wash thoroughly, paying special attention to the area where your    surgery  will be performed.  7.  Thoroughly rinse your body with warm water from the neck down.  8.  DO NOT shower/wash  with your normal soap after using and rinsing off the CHG Soap.                9.  Pat yourself dry with a clean towel.            10.  Wear clean pajamas.            11.  Place clean sheets on your bed the night of your first shower and do not  sleep with pets. Day of Surgery : Do not apply any lotions/deodorants the morning of surgery.  Please wear clean clothes to the hospital/surgery center.  FAILURE TO FOLLOW THESE INSTRUCTIONS MAY RESULT IN THE CANCELLATION OF YOUR SURGERY  PATIENT SIGNATURE_________________________________  NURSE SIGNATURE__________________________________  _____________________________Incentive [employer] (Watch this video at home: [email or web address])  An incentive spirometer is a tool that can help keep your lungs clear and active. This tool measures how well you are filling your lungs with each breath. Taking long deep breaths may help reverse or decrease the chance of developing breathing (pulmonary) problems (especially infection) following: A long period of time when you are unable to move or be active. BEFORE THE PROCEDURE  If the spirometer includes an indicator to show your best effort, your nurse or respiratory therapist will set it to a desired goal. If possible, sit up straight or lean slightly forward. Try not to slouch. Hold the incentive spirometer in an upright position. INSTRUCTIONS FOR USE  Sit on the edge of your bed if possible, or sit up as far as you can in bed or on a chair. Hold the incentive spirometer in an upright position. Breathe out normally. Place the mouthpiece in your mouth and seal your lips tightly around it. Breathe in slowly and as deeply as possible, raising the piston or the ball toward the top of the column. Hold your breath for 3-5 seconds or for as long as possible. Allow the piston or ball to fall to the bottom of the column. Remove the mouthpiece from your mouth and breathe out normally. Rest  for a few seconds and repeat Steps 1 through 7 at least 10 times every 1-2 hours when you are awake. Take your time and take a few normal breaths between deep breaths. The spirometer may include an indicator to show your best effort. Use the indicator as a goal to work toward during each repetition. After each set of 10 deep breaths, practice coughing to be sure your lungs are clear. If you have an incision (the cut made at the time of surgery), support your incision when coughing by placing  a pillow or rolled up towels firmly against it. Once you are able to get out of bed, walk around indoors and cough well. You may stop using the incentive spirometer when instructed by your caregiver.  RISKS AND COMPLICATIONS Take your time so you do not get dizzy or light-headed. If you are in pain, you may need to take or ask for pain medication before doing incentive spirometry. It is harder to take a deep breath if you are having pain. AFTER USE Rest and breathe slowly and easily. It can be helpful to keep track of a log of your progress. Your caregiver can provide you with a simple table to help with this. If you are using the spirometer at home, follow these instructions: SEEK MEDICAL CARE IF:  You are having difficultly using the spirometer. You have trouble using the spirometer as often as instructed. Your pain medication is not giving enough relief while using the spirometer. You develop fever of 100.5 F (38.1 C) or higher. SEEK IMMEDIATE MEDICAL CARE IF:  You cough up bloody sputum that had not been present before. You develop fever of 102 F (38.9 C) or greater. You develop worsening pain at or near the incision site. MAKE SURE YOU:  Understand these instructions. Will watch your condition. Will get help right away if you are not doing well or get worse. Document Released: 08/14/2006 Document Revised: 06/26/2011 Document Reviewed: 10/15/2006 Exit[employer] Patient Information 2014 [employer].___________________________________________

## 2023-03-16 DIAGNOSIS — G8929 Other chronic pain: Secondary | ICD-10-CM | POA: Insufficient documentation

## 2023-03-16 HISTORY — DX: Other chronic pain: G89.29

## 2023-03-16 NOTE — Assessment & Plan Note (Signed)
Severe depression with daily symptoms. Currently on Pristiq with some perceived benefit but PHQ-9 score remains high. Psychiatrist recommended increasing Pristiq to 100mg  daily and patient is in agreement. -Increase Pristiq to 100mg  daily. -Continue therapy sessions. -Transfer psychiatric medication management to psychiatrist.    Caregiver for Mother Patient has FMLA for her mother who has significant chronic medical issues. Patient provides psychological support and assistance during hospitalizations and at home. -Continue current caregiving responsibilities as able.  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 There is confusion regarding paperwork for FMLA and ADA accommodations. -Advise patient to have psychiatrist complete paperwork related to mental health leave. -Complete necessary paperwork related to patient's physical health conditions.

## 2023-03-16 NOTE — Assessment & Plan Note (Signed)
Scheduled for December 11th. Workman's comp related. Duration of medical leave to be determined by orthopedic surgeon. -Continue current plan as directed by orthopedic surgeon.

## 2023-03-16 NOTE — Assessment & Plan Note (Signed)
Continue Pristiq to 50 mg daily. Continue counseling.

## 2023-03-17 ENCOUNTER — Encounter: Payer: Self-pay | Admitting: Emergency Medicine

## 2023-03-17 ENCOUNTER — Ambulatory Visit
Admission: EM | Admit: 2023-03-17 | Discharge: 2023-03-17 | Disposition: A | Discharge status: Home / Self Care | Payer: 59 | Attending: Physician Assistant | Admitting: Physician Assistant

## 2023-03-17 DIAGNOSIS — H1033 Unspecified acute conjunctivitis, bilateral: Secondary | ICD-10-CM

## 2023-03-17 MED ORDER — POLYMYXIN B-TRIMETHOPRIM 10000-0.1 UNIT/ML-% OP SOLN: 1.0000 [drp] | OPHTHALMIC | 0 refills | Status: AC

## 2023-03-17 NOTE — ED Triage Notes (Signed)
Patient states that she woke up yesterday w/left eye swelling, some drainage.  Patient thought it was an allergic reaction to something she ate.  This morning both eyes are red, swollen and drainage.  Patient applied a warm compress to her eyes and headache.  Denies any OTC meds.

## 2023-03-17 NOTE — ED Provider Notes (Signed)
[facility]    CSN: [id number] Arrival date & time: 03/17/23  0806      History   Chief Complaint Chief Complaint  Patient presents with   Eye Problem    HPI Brenda Mcmahon is a 32 y.o. female.   Patient here today for evaluation of bilateral eye irritation.  She states symptoms started in her left eye yesterday and now are in both eyes.  She has tried warm compresses without resolution.  She reports some mild headache but no other symptoms.  She has not taken anything for symptoms.  The history is provided by the patient.  Eye Problem Associated symptoms: discharge, itching and redness   Associated symptoms: no nausea and no vomiting     Past Medical History:  Diagnosis Date   Palpitation     Patient Active Problem List   Diagnosis Date Noted   Chronic right shoulder pain 03/16/2023   Other insomnia 10/14/2022   Routine medical exam 09/18/2021   Need for DTaP vaccine 09/18/2021   Moderate recurrent major depression (HCC) 08/07/2021   GAD (generalized anxiety disorder) 08/07/2021   Encounter for other contraceptive management 08/07/2021   Palpitation 11/10/2020   Achilles bursitis of right lower extremity 03/14/2017   Recurrent major depression-severe (HCC) 03/14/2017    Past Surgical History:  Procedure Laterality Date   FOOT FRACTURE SURGERY Right 2015   also ankle fracture repaired.    WRIST FRACTURE SURGERY Left    32 yo.  fell through glass door.     OB History   No obstetric history on file.      Home Medications    Prior to Admission medications   Medication Sig Start Date End Date Taking? Authorizing Provider  acetaminophen (TYLENOL) 500 MG tablet Take 1,000 mg by mouth every 6 (six) hours as needed for moderate pain (pain score 4-6).   Yes [provider]  desvenlafaxine (PRISTIQ) 100 MG 24 hr tablet Take 1 tablet (100 mg total) by mouth every morning. 03/13/23  Yes Cox, Kirsten, MD  fluticasone [facility]) 50 MCG/ACT  nasal spray Place 2 sprays into both nostrils daily. 10/12/22  Yes Cox, Kirsten, MD  trimethoprim-polymyxin b (POLYTRIM) ophthalmic solution Place 1 drop into both eyes every 4 (four) hours for 7 days. 03/17/23 03/24/23 Yes Tomi Bamberger, PA-C  zolpidem (AMBIEN) 5 MG tablet Take 1 tablet (5 mg total) by mouth at bedtime as needed for sleep. 10/11/22   Cox, Fritzi Mandes, MD  acebutolol (SECTRAL) 200 MG capsule Take 1 capsule (200 mg total) by mouth 2 (two) times daily. 09/07/22   Baldo Daub, MD  meloxicam (MOBIC) 15 MG tablet Take 1 tablet (15 mg total) by mouth daily as needed. Take with a meal. Patient not taking: Reported on 03/13/2023 11/24/22     tiZANidine (ZANAFLEX) 4 MG capsule Take 1 capsule (4 mg total) by mouth daily as needed. Patient not taking: Reported on 03/13/2023 11/24/22       Family History Family History  Problem Relation Age of Onset   Seizures Mother    Hypotension Mother    Asthma Brother    Seizures Maternal Grandfather     Social History Social History   Tobacco Use   Smoking status: Never   Smokeless tobacco: Never  Vaping Use   Vaping status: Never Used  Substance Use Topics   Alcohol use: No   Drug use: Never     Allergies   Hydrocodone, Kale, Oxycodone, Lactose intolerance (gi), and Latex  Review of Systems Review of Systems  Constitutional:  Negative for chills and fever.  Eyes:  Positive for discharge, redness and itching.  Respiratory:  Negative for shortness of breath.   Gastrointestinal:  Negative for abdominal pain, nausea and vomiting.     Physical Exam Triage Vital Signs ED Triage Vitals  Encounter Vitals Group     BP 03/17/23 0859 123/75     Systolic BP Percentile --      Diastolic BP Percentile --      Pulse Rate 03/17/23 0859 79     Resp 03/17/23 0859 16     Temp 03/17/23 0859 98.1 F (36.7 C)     Temp Source 03/17/23 0859 Oral     SpO2 03/17/23 0859 98 %     Weight 03/17/23 0901 120 lb (54.4 kg)     Height 03/17/23 0901  5\' 2"  (1.575 m)     Head Circumference --      Peak Flow --      Pain Score 03/17/23 0901 4     Pain Loc --      Pain Education --      Exclude from Growth Chart --    No data found.  Updated Vital Signs BP 123/75 (BP Location: Left Arm)   Pulse 79   Temp 98.1 F (36.7 C) (Oral)   Resp 16   Ht 5\' 2"  (1.575 m)   Wt 120 lb (54.4 kg)   LMP 03/17/2023 (Exact Date)   SpO2 98%   BMI 21.95 kg/m   Physical Exam Vitals and nursing note reviewed.  Constitutional:      General: She is not in acute distress.    Appearance: Normal appearance. She is not ill-appearing.  HENT:     Head: Normocephalic and atraumatic.  Eyes:     Extraocular Movements: Extraocular movements intact.     Pupils: Pupils are equal, round, and reactive to light.     Comments: Bilateral conjunctiva injected minimal swelling noted to bilateral upper lids  Cardiovascular:     Rate and Rhythm: Normal rate.  Pulmonary:     Effort: Pulmonary effort is normal.  Neurological:     Mental Status: She is alert.  Psychiatric:        Mood and Affect: Mood normal.        Behavior: Behavior normal.        Thought Content: Thought content normal.      UC Treatments / Results  Labs (all labs ordered are listed, but only abnormal results are displayed) Labs Reviewed - No data to display  EKG   Radiology No results found.  Procedures Procedures (including critical care time)  Medications Ordered in UC Medications - No data to display  Initial Impression / Assessment and Plan / UC Course  I have reviewed the triage vital signs and the nursing notes.  Pertinent labs & imaging results that were available during my care of the patient were reviewed by me and considered in my medical decision making (see chart for details).    Suspect likely conjunctivitis and will treat with Polytrim drops.  Advise follow-up if no gradual improvement with any further concerns.  Final Clinical Impressions(s) / UC Diagnoses    Final diagnoses:  Acute conjunctivitis of both eyes, unspecified acute conjunctivitis type   Discharge Instructions   None    ED Prescriptions     Medication Sig Dispense Auth. Provider   trimethoprim-polymyxin b (POLYTRIM) ophthalmic solution Place 1 drop into both  eyes every 4 (four) hours for 7 days. 10 mL Tomi Bamberger, PA-C      PDMP not reviewed this encounter.   Tomi Bamberger, PA-C 03/17/23 [phone]

## 2023-03-18 ENCOUNTER — Encounter: Payer: Self-pay | Admitting: Family Medicine

## 2023-03-19 ENCOUNTER — Encounter (HOSPITAL_COMMUNITY): Payer: Self-pay

## 2023-03-19 ENCOUNTER — Other Ambulatory Visit: Payer: Self-pay

## 2023-03-19 ENCOUNTER — Ambulatory Visit: Payer: 59 | Admitting: Family Medicine

## 2023-03-19 ENCOUNTER — Encounter (HOSPITAL_COMMUNITY)
Admission: RE | Admit: 2023-03-19 | Discharge: 2023-03-19 | Disposition: A | Discharge status: Home / Self Care | Payer: 59 | Source: Ambulatory Visit | Attending: Specialist | Admitting: Specialist

## 2023-03-19 VITALS — BP 129/74 | HR 90 | Temp 98.2°F | Resp 16 | Ht 62.0 in | Wt 120.0 lb

## 2023-03-19 DIAGNOSIS — Z01812 Encounter for preprocedural laboratory examination: Secondary | ICD-10-CM | POA: Diagnosis present

## 2023-03-19 DIAGNOSIS — R011 Cardiac murmur, unspecified: Secondary | ICD-10-CM | POA: Insufficient documentation

## 2023-03-19 DIAGNOSIS — X58XXXA Exposure to other specified factors, initial encounter: Secondary | ICD-10-CM | POA: Insufficient documentation

## 2023-03-19 DIAGNOSIS — F332 Major depressive disorder, recurrent severe without psychotic features: Secondary | ICD-10-CM | POA: Diagnosis not present

## 2023-03-19 DIAGNOSIS — Z01818 Encounter for other preprocedural examination: Secondary | ICD-10-CM

## 2023-03-19 DIAGNOSIS — F4312 Post-traumatic stress disorder, chronic: Secondary | ICD-10-CM | POA: Diagnosis not present

## 2023-03-19 DIAGNOSIS — S46011A Strain of muscle(s) and tendon(s) of the rotator cuff of right shoulder, initial encounter: Secondary | ICD-10-CM | POA: Insufficient documentation

## 2023-03-19 HISTORY — DX: Cardiac arrhythmia, unspecified: I49.9

## 2023-03-19 HISTORY — DX: Cardiac murmur, unspecified: R01.1

## 2023-03-19 HISTORY — DX: Anxiety disorder, unspecified: F41.9

## 2023-03-19 HISTORY — DX: Depression, unspecified: F32.A

## 2023-03-19 LAB — CBC
HCT: 39.1 % (ref 36.0–46.0)
Hemoglobin: 12.4 g/dL (ref 12.0–15.0)
MCH: 28.6 pg (ref 26.0–34.0)
MCHC: 31.7 g/dL (ref 30.0–36.0)
MCV: 90.1 fL (ref 80.0–100.0)
Platelets: 223 10*3/uL (ref 150–400)
RBC: 4.34 MIL/uL (ref 3.87–5.11)
RDW: 12 % (ref 11.5–15.5)
WBC: 8 10*3/uL (ref 4.0–10.5)
nRBC: 0 % (ref 0.0–0.2)

## 2023-03-21 ENCOUNTER — Encounter (HOSPITAL_COMMUNITY): Payer: Self-pay

## 2023-03-21 DIAGNOSIS — F4312 Post-traumatic stress disorder, chronic: Secondary | ICD-10-CM | POA: Diagnosis not present

## 2023-03-21 DIAGNOSIS — F332 Major depressive disorder, recurrent severe without psychotic features: Secondary | ICD-10-CM | POA: Diagnosis not present

## 2023-03-21 NOTE — Progress Notes (Signed)
Case: [id number] Date/Time: 03/28/23 1125   Procedure: SHOULDER ARTHROSCOPY  AND SUBACROMIAL DECOMPRESSION DEBRIDEMENT POSSIBLE MINI OPEN ROTATOR CUFF REPAIR (Right)   Anesthesia type: Choice   Pre-op diagnosis: right shoulder impengment syndrome partial rotator cuff tear   Location: [facility] ROOM 10 / [facility]   Surgeons: Jene Every, MD       DISCUSSION: Brenda Mcmahon is a 32 yo female who presents to PAT prior to surgery above. PMH of heart murmur, SVT, anxiety, depression, insomnia.  Patient was evaluated by Cardiology for tachycardia and heart murmur. Echo was done which was normal but mild MVP is suspected based off physical exam. Holter monitoring was done which showed SVT. She controls symptoms with a beta blocker.   Visit to UC noted for conjunctivitis on 11/30. Treated with polytrim eye drops. No other symptoms noted.  VS: BP 129/74   Pulse 90   Temp 36.8 C (Oral)   Resp 16   Ht 5\' 2"  (1.575 m)   Wt 54.4 kg   LMP 03/17/2023 (Exact Date)   SpO2 100%   BMI 21.95 kg/m   PROVIDERS: CoxFritzi Mandes, MD   LABS: Labs reviewed: Acceptable for surgery. (all labs ordered are listed, but only abnormal results are displayed)  Labs Reviewed  CBC     IMAGES:   EKG:   CV:  Holter monitor 08/12/2020:  Patch Wear Time:  14 days and 0 hours (2022-04-11T16:24:43-0400 to 2022-04-25T16:24:43-0400)   Patient had a min HR of 45 bpm, max HR of 175 bpm, and avg HR of 82 bpm. Predominant underlying rhythm was Sinus Rhythm. Isolated SVEs were rare (<1.0%), SVE Triplets were rare (<1.0%), and no SVE Couplets were present. Isolated VEs were rare (<1.0%),  and no VE Couplets or VE Triplets were present.    There were 13 triggered and 6 diary events all associated with sinus rhythm and sinus tachycardia 1 with a single atrial premature contraction.   Ventricular ectopy was rare.   Supraventricular ectopy was rare without atrial fibrillation or flutter.   Were no pauses of 3 seconds or  greater and no episodes of second or third-degree AV node block or sinus node exit block.  Echo 07/29/2020:  IMPRESSIONS    1. Left ventricular ejection fraction, by estimation, is 70 to 75%. Left ventricular ejection fraction by 3D volume is 69 %. The left ventricle has hyperdynamic function. The left ventricle has no regional wall motion abnormalities. Left ventricular diastolic parameters were normal. The average left ventricular global longitudinal strain is -28.5 %. The global longitudinal strain is normal.  2. Right ventricular systolic function is normal. The right ventricular size is normal.  3. The mitral valve is normal in structure. No evidence of mitral valve regurgitation. No evidence of mitral stenosis.  4. The aortic valve is tricuspid. Aortic valve regurgitation is not visualized. No aortic stenosis is present.  5. The inferior vena cava is normal in size with greater than 50% respiratory variability, suggesting right atrial pressure of 3 mmHg.    Past Medical History:  Diagnosis Date   Anxiety    Depression    Dysrhythmia    Heart murmur    Palpitation     Past Surgical History:  Procedure Laterality Date   FOOT FRACTURE SURGERY Right 2015   also ankle fracture repaired.    WRIST FRACTURE SURGERY Left    32 yo.  fell through glass door.     MEDICATIONS:  zolpidem (AMBIEN) 5 MG tablet   acebutolol (SECTRAL) 200  MG capsule   acetaminophen (TYLENOL) 500 MG tablet   desvenlafaxine (PRISTIQ) 100 MG 24 hr tablet   fluticasone (FLONASE) 50 MCG/ACT nasal spray   meloxicam (MOBIC) 15 MG tablet   tiZANidine (ZANAFLEX) 4 MG capsule   trimethoprim-polymyxin b (POLYTRIM) ophthalmic solution   No current facility-administered medications for this encounter.    Marcille Blanco [facility] Short Stay[facility] Phone [phone] 03/21/2023 8:29 AM

## 2023-03-21 NOTE — Anesthesia Preprocedure Evaluation (Addendum)
Anesthesia Evaluation  Patient identified by MRN, date of birth, ID band Patient awake    Reviewed: Allergy & Precautions, NPO status , Patient's Chart, lab work & pertinent test results  Airway Mallampati: II  TM Distance: >3 FB Neck ROM: Full    Dental no notable dental hx. (+) Teeth Intact, Dental Advisory Given   Pulmonary    Pulmonary exam normal breath sounds clear to auscultation       Cardiovascular Normal cardiovascular exam+ dysrhythmias Supra Ventricular Tachycardia  Rhythm:Regular Rate:Normal     Neuro/Psych  PSYCHIATRIC DISORDERS Anxiety Depression       GI/Hepatic negative GI ROS,,,  Endo/Other  negative endocrine ROS    Renal/GU negative Renal ROS     Musculoskeletal   Abdominal   Peds  Hematology Lab Results      Component                Value               Date                      WBC                      8.0                 03/19/2023                HGB                      12.4                03/19/2023                HCT                      39.1                03/19/2023                MCV                      90.1                03/19/2023                PLT                      223                 03/19/2023              Anesthesia Other Findings All: see list  Reproductive/Obstetrics                              Anesthesia Physical Anesthesia Plan  ASA: 2  Anesthesia Plan: General and Regional   Post-op Pain Management: Regional block*, Minimal or no pain anticipated and Tylenol PO (pre-op)*   Induction: Intravenous  PONV Risk Score and Plan: 3 and Treatment may vary due to age or medical condition, Midazolam, Ondansetron and Dexamethasone  Airway Management Planned: Oral ETT  Additional Equipment: None  Intra-op Plan:   Post-operative Plan: Extubation in OR  Informed Consent: I have reviewed the patients History and Physical, chart, labs and  discussed the procedure including the risks, benefits and alternatives  for the proposed anesthesia with the patient or authorized representative who has indicated his/her understanding and acceptance.     Dental advisory given  Plan Discussed with: CRNA and Anesthesiologist  Anesthesia Plan Comments: (See PAT note from 12/2 by Sherlie Ban PA-C  [place] w R ISB w exparel )         Anesthesia Quick Evaluation

## 2023-03-22 ENCOUNTER — Telehealth: Payer: Self-pay

## 2023-03-22 NOTE — Telephone Encounter (Signed)
Spoke with patient.

## 2023-03-22 NOTE — Telephone Encounter (Signed)
Called patient to make her aware that her FMLA paperwork is ready. Unable to leave message. If patient call back please connect her to me.

## 2023-03-26 ENCOUNTER — Telehealth: Payer: Self-pay

## 2023-03-26 DIAGNOSIS — F4312 Post-traumatic stress disorder, chronic: Secondary | ICD-10-CM | POA: Diagnosis not present

## 2023-03-26 DIAGNOSIS — F332 Major depressive disorder, recurrent severe without psychotic features: Secondary | ICD-10-CM | POA: Diagnosis not present

## 2023-03-26 NOTE — Telephone Encounter (Unsigned)
Copied from CRM [id number]. Topic: Medical Record Request - Other >> Mar 26, 2023 11:08 AM Deaijah H wrote: Reason for CRM: Patient called in stating the date on FMLA  was incorrect from 05/24/2022 to 05/25/2023. Received paperwork for short term disability but wanted to make sure Dr. Sedalia Muta received / callback # [phone]

## 2023-03-27 ENCOUNTER — Telehealth: Payer: Self-pay

## 2023-03-27 NOTE — Telephone Encounter (Signed)
Spoke with patient made her aware that we will fix paperwork and fax back, and also we will get other forms filled out.

## 2023-03-27 NOTE — Telephone Encounter (Signed)
Copied from CRM [id number]. Topic: Medical Record Request - Other >> Mar 26, 2023 11:08 AM Deaijah H wrote: Reason for CRM: Patient called in stating the date on FMLA  was incorrect from 05/24/2022 to 05/25/2023. Received paperwork for short term disability but wanted to make sure Dr. Sedalia Muta received / callback # [phone]

## 2023-03-28 ENCOUNTER — Ambulatory Visit (HOSPITAL_COMMUNITY)
Admission: RE | Admit: 2023-03-28 | Discharge: 2023-03-28 | Disposition: A | Discharge status: Home / Self Care | Payer: PRIVATE HEALTH INSURANCE | Source: Ambulatory Visit | Attending: Specialist | Admitting: Specialist

## 2023-03-28 ENCOUNTER — Other Ambulatory Visit (HOSPITAL_COMMUNITY): Payer: Self-pay

## 2023-03-28 ENCOUNTER — Ambulatory Visit (HOSPITAL_COMMUNITY): Payer: PRIVATE HEALTH INSURANCE | Admitting: Medical

## 2023-03-28 ENCOUNTER — Other Ambulatory Visit: Payer: Self-pay

## 2023-03-28 ENCOUNTER — Encounter (HOSPITAL_COMMUNITY)
Admission: RE | Disposition: A | Discharge status: Home / Self Care | Payer: Self-pay | Source: Ambulatory Visit | Attending: Specialist

## 2023-03-28 ENCOUNTER — Ambulatory Visit (HOSPITAL_COMMUNITY): Payer: Self-pay | Admitting: Anesthesiology

## 2023-03-28 ENCOUNTER — Encounter (HOSPITAL_COMMUNITY): Payer: Self-pay | Admitting: Specialist

## 2023-03-28 DIAGNOSIS — M7541 Impingement syndrome of right shoulder: Secondary | ICD-10-CM | POA: Insufficient documentation

## 2023-03-28 DIAGNOSIS — X58XXXA Exposure to other specified factors, initial encounter: Secondary | ICD-10-CM | POA: Insufficient documentation

## 2023-03-28 DIAGNOSIS — M719 Bursopathy, unspecified: Secondary | ICD-10-CM | POA: Diagnosis not present

## 2023-03-28 DIAGNOSIS — M75111 Incomplete rotator cuff tear or rupture of right shoulder, not specified as traumatic: Secondary | ICD-10-CM | POA: Insufficient documentation

## 2023-03-28 DIAGNOSIS — S43431A Superior glenoid labrum lesion of right shoulder, initial encounter: Secondary | ICD-10-CM | POA: Insufficient documentation

## 2023-03-28 DIAGNOSIS — I471 Supraventricular tachycardia, unspecified: Secondary | ICD-10-CM | POA: Insufficient documentation

## 2023-03-28 HISTORY — PX: SHOULDER ARTHROSCOPY WITH ROTATOR CUFF REPAIR AND SUBACROMIAL DECOMPRESSION: SHX5686

## 2023-03-28 LAB — POCT PREGNANCY, URINE: Preg Test, Ur: NEGATIVE

## 2023-03-28 SURGERY — SHOULDER ARTHROSCOPY WITH ROTATOR CUFF REPAIR AND SUBACROMIAL DECOMPRESSION
Anesthesia: Regional | Laterality: Right

## 2023-03-28 MED ORDER — LACTATED RINGERS IV SOLN
INTRAVENOUS | Status: DC
Start: 2023-03-28 — End: 2023-03-28

## 2023-03-28 MED ORDER — ORAL CARE MOUTH RINSE: 15.0000 mL | Freq: Once | OROMUCOSAL | Status: AC

## 2023-03-28 MED ORDER — POLYETHYLENE GLYCOL 3350 17 G PO PACK
17.0000 g | PACK | Freq: Every day | ORAL | 0 refills | Status: DC
  Filled 2023-03-28: qty 14, 14d supply, fill #0

## 2023-03-28 MED ORDER — KETOROLAC TROMETHAMINE 10 MG PO TABS
10.0000 mg | ORAL_TABLET | Freq: Four times a day (QID) | ORAL | 0 refills | Status: DC | PRN
  Filled 2023-03-28: qty 20, 5d supply, fill #0

## 2023-03-28 MED ORDER — FENTANYL CITRATE PF 50 MCG/ML IJ SOSY
25.0000 ug | PREFILLED_SYRINGE | INTRAMUSCULAR | Status: DC | PRN
  Administered 2023-03-28: 50 ug via INTRAVENOUS

## 2023-03-28 MED ORDER — BUPIVACAINE-EPINEPHRINE 0.5% -1:200000 IJ SOLN
INTRAMUSCULAR | Status: DC | PRN
  Administered 2023-03-28: 30 mL

## 2023-03-28 MED ORDER — LIDOCAINE HCL (CARDIAC) PF 100 MG/5ML IV SOSY
PREFILLED_SYRINGE | INTRAVENOUS | Status: DC | PRN
  Administered 2023-03-28: 100 mg via INTRAVENOUS

## 2023-03-28 MED ORDER — TRANEXAMIC ACID-NACL 1000-0.7 MG/100ML-% IV SOLN
1000.0000 mg | INTRAVENOUS | Status: AC
  Administered 2023-03-28: 1000 mg via INTRAVENOUS

## 2023-03-28 MED ORDER — FENTANYL CITRATE PF 50 MCG/ML IJ SOSY
PREFILLED_SYRINGE | INTRAMUSCULAR | Status: AC
  Administered 2023-03-28: 50 ug via INTRAVENOUS
  Filled 2023-03-28: qty 2

## 2023-03-28 MED ORDER — KETOROLAC TROMETHAMINE 10 MG PO TABS
10.0000 mg | ORAL_TABLET | Freq: Once | ORAL | Status: DC | PRN
  Filled 2023-03-28: qty 1

## 2023-03-28 MED ORDER — METHOCARBAMOL 500 MG PO TABS: 500.0000 mg | ORAL_TABLET | Freq: Four times a day (QID) | ORAL | Status: DC | PRN

## 2023-03-28 MED ORDER — ONDANSETRON HCL 4 MG/2ML IJ SOLN: 4.0000 mg | Freq: Once | INTRAMUSCULAR | Status: AC | PRN

## 2023-03-28 MED ORDER — BUPIVACAINE HCL (PF) 0.5 % IJ SOLN
INTRAMUSCULAR | Status: DC | PRN
  Administered 2023-03-28: 12 mL via PERINEURAL

## 2023-03-28 MED ORDER — CHLORHEXIDINE GLUCONATE 0.12 % MT SOLN
15.0000 mL | Freq: Once | OROMUCOSAL | Status: AC
  Administered 2023-03-28: 15 mL via OROMUCOSAL

## 2023-03-28 MED ORDER — FENTANYL CITRATE PF 50 MCG/ML IJ SOSY
50.0000 ug | PREFILLED_SYRINGE | Freq: Once | INTRAMUSCULAR | Status: AC
  Administered 2023-03-28: 50 ug via INTRAVENOUS
  Filled 2023-03-28: qty 2

## 2023-03-28 MED ORDER — PHENYLEPHRINE HCL-NACL 20-0.9 MG/250ML-% IV SOLN
INTRAVENOUS | Status: DC | PRN
  Administered 2023-03-28: 40 ug/min via INTRAVENOUS

## 2023-03-28 MED ORDER — EPINEPHRINE PF 1 MG/ML IJ SOLN
INTRAMUSCULAR | Status: AC
  Filled 2023-03-28: qty 2

## 2023-03-28 MED ORDER — KETOROLAC TROMETHAMINE 30 MG/ML IJ SOLN: 30.0000 mg | Freq: Once | INTRAMUSCULAR | Status: AC | PRN

## 2023-03-28 MED ORDER — DROPERIDOL 2.5 MG/ML IJ SOLN: 0.6250 mg | Freq: Once | INTRAMUSCULAR | Status: DC | PRN

## 2023-03-28 MED ORDER — PHENYLEPHRINE HCL-NACL 20-0.9 MG/250ML-% IV SOLN
INTRAVENOUS | Status: AC
  Filled 2023-03-28: qty 250

## 2023-03-28 MED ORDER — LACTATED RINGERS IV SOLN: INTRAVENOUS | Status: DC | PRN

## 2023-03-28 MED ORDER — ACETAMINOPHEN 10 MG/ML IV SOLN: 1000.0000 mg | Freq: Once | INTRAVENOUS | Status: DC | PRN

## 2023-03-28 MED ORDER — DEXAMETHASONE SODIUM PHOSPHATE 10 MG/ML IJ SOLN
INTRAMUSCULAR | Status: DC | PRN
  Administered 2023-03-28: 8 mg via INTRAVENOUS

## 2023-03-28 MED ORDER — OXYCODONE HCL 5 MG PO TABS: 5.0000 mg | ORAL_TABLET | Freq: Once | ORAL | Status: DC | PRN

## 2023-03-28 MED ORDER — ROCURONIUM BROMIDE 100 MG/10ML IV SOLN
INTRAVENOUS | Status: DC | PRN
  Administered 2023-03-28: 50 mg via INTRAVENOUS

## 2023-03-28 MED ORDER — DOCUSATE SODIUM 100 MG PO CAPS
100.0000 mg | ORAL_CAPSULE | Freq: Two times a day (BID) | ORAL | 2 refills | Status: DC
  Filled 2023-03-28: qty 60, 30d supply, fill #0

## 2023-03-28 MED ORDER — ACETAMINOPHEN 10 MG/ML IV SOLN
INTRAVENOUS | Status: AC
  Administered 2023-03-28: 1000 mg via INTRAVENOUS
  Filled 2023-03-28: qty 100

## 2023-03-28 MED ORDER — KETOROLAC TROMETHAMINE 30 MG/ML IJ SOLN
INTRAMUSCULAR | Status: AC
  Administered 2023-03-28: 30 mg via INTRAVENOUS
  Filled 2023-03-28: qty 1

## 2023-03-28 MED ORDER — SUGAMMADEX SODIUM 200 MG/2ML IV SOLN
INTRAVENOUS | Status: DC | PRN
  Administered 2023-03-28: 200 mg via INTRAVENOUS

## 2023-03-28 MED ORDER — TRANEXAMIC ACID-NACL 1000-0.7 MG/100ML-% IV SOLN
INTRAVENOUS | Status: AC
  Filled 2023-03-28: qty 100

## 2023-03-28 MED ORDER — ASPIRIN 81 MG PO TBEC
81.0000 mg | DELAYED_RELEASE_TABLET | Freq: Every day | ORAL | 1 refills | Status: DC
  Filled 2023-03-28: qty 30, 30d supply, fill #0

## 2023-03-28 MED ORDER — CEFAZOLIN SODIUM-DEXTROSE 2-4 GM/100ML-% IV SOLN
2.0000 g | INTRAVENOUS | Status: AC
Start: 2023-03-28 — End: 2023-03-28
  Administered 2023-03-28: 2 g via INTRAVENOUS

## 2023-03-28 MED ORDER — METHOCARBAMOL 500 MG PO TABS
ORAL_TABLET | ORAL | Status: AC
  Administered 2023-03-28: 500 mg via ORAL
  Filled 2023-03-28: qty 1

## 2023-03-28 MED ORDER — DEXAMETHASONE SODIUM PHOSPHATE 10 MG/ML IJ SOLN
INTRAMUSCULAR | Status: AC
  Filled 2023-03-28: qty 1

## 2023-03-28 MED ORDER — PROPOFOL 10 MG/ML IV BOLUS
INTRAVENOUS | Status: DC | PRN
  Administered 2023-03-28: 150 mg via INTRAVENOUS

## 2023-03-28 MED ORDER — FENTANYL CITRATE (PF) 100 MCG/2ML IJ SOLN
INTRAMUSCULAR | Status: AC
  Filled 2023-03-28: qty 2

## 2023-03-28 MED ORDER — PROPOFOL 500 MG/50ML IV EMUL
INTRAVENOUS | Status: AC
  Filled 2023-03-28: qty 50

## 2023-03-28 MED ORDER — OXYCODONE HCL 5 MG/5ML PO SOLN: 5.0000 mg | Freq: Once | ORAL | Status: DC | PRN

## 2023-03-28 MED ORDER — ONDANSETRON HCL 4 MG/2ML IJ SOLN
INTRAMUSCULAR | Status: AC
  Administered 2023-03-28: 4 mg via INTRAVENOUS
  Filled 2023-03-28: qty 2

## 2023-03-28 MED ORDER — ROCURONIUM BROMIDE 10 MG/ML (PF) SYRINGE
PREFILLED_SYRINGE | INTRAVENOUS | Status: AC
  Filled 2023-03-28: qty 10

## 2023-03-28 MED ORDER — FENTANYL CITRATE (PF) 100 MCG/2ML IJ SOLN
INTRAMUSCULAR | Status: DC | PRN
  Administered 2023-03-28: 100 ug via INTRAVENOUS

## 2023-03-28 MED ORDER — METHOCARBAMOL 1000 MG/10ML IJ SOLN: 500.0000 mg | Freq: Four times a day (QID) | INTRAMUSCULAR | Status: DC | PRN

## 2023-03-28 MED ORDER — BUPIVACAINE LIPOSOME 1.3 % IJ SUSP
INTRAMUSCULAR | Status: DC | PRN
  Administered 2023-03-28: 10 mL via PERINEURAL

## 2023-03-28 MED ORDER — MIDAZOLAM HCL 5 MG/5ML IJ SOLN
INTRAMUSCULAR | Status: DC | PRN
  Administered 2023-03-28: 2 mg via INTRAVENOUS

## 2023-03-28 MED ORDER — BUPIVACAINE-EPINEPHRINE (PF) 0.5% -1:200000 IJ SOLN
INTRAMUSCULAR | Status: AC
  Filled 2023-03-28: qty 30

## 2023-03-28 MED ORDER — CEFAZOLIN SODIUM-DEXTROSE 2-4 GM/100ML-% IV SOLN
INTRAVENOUS | Status: AC
  Filled 2023-03-28: qty 100

## 2023-03-28 MED ORDER — ONDANSETRON HCL 4 MG/2ML IJ SOLN
INTRAMUSCULAR | Status: AC
  Filled 2023-03-28: qty 2

## 2023-03-28 MED ORDER — EPINEPHRINE PF 1 MG/ML IJ SOLN
INTRAMUSCULAR | Status: DC | PRN
  Administered 2023-03-28: 1 mg

## 2023-03-28 MED ORDER — LIDOCAINE HCL (PF) 2 % IJ SOLN
INTRAMUSCULAR | Status: AC
Start: 2023-03-28 — End: ?
  Filled 2023-03-28: qty 5

## 2023-03-28 MED ORDER — SODIUM CHLORIDE 0.9 % IR SOLN
Status: DC | PRN
  Administered 2023-03-28: 3000 mL

## 2023-03-28 MED ORDER — MIDAZOLAM HCL 2 MG/2ML IJ SOLN
INTRAMUSCULAR | Status: AC
  Filled 2023-03-28: qty 2

## 2023-03-28 MED ORDER — MIDAZOLAM HCL 2 MG/2ML IJ SOLN
1.0000 mg | Freq: Once | INTRAMUSCULAR | Status: AC
  Administered 2023-03-28: 2 mg via INTRAVENOUS
  Filled 2023-03-28: qty 2

## 2023-03-28 MED ORDER — PHENYLEPHRINE HCL (PRESSORS) 10 MG/ML IV SOLN
INTRAVENOUS | Status: DC | PRN
  Administered 2023-03-28 (×4): 160 ug via INTRAVENOUS

## 2023-03-28 MED ORDER — ONDANSETRON HCL 4 MG/2ML IJ SOLN
INTRAMUSCULAR | Status: DC | PRN
  Administered 2023-03-28: 4 mg via INTRAVENOUS

## 2023-03-28 SURGICAL SUPPLY — 60 items
ANCHOR NDL 9/16 CIR SZ 8 (NEEDLE) IMPLANT
ANCHOR NEEDLE 9/16 CIR SZ 8 (NEEDLE) IMPLANT
BAG COUNTER SPONGE SURGICOUNT (BAG) IMPLANT
BLADE SHAVER TORPEDO 4X13 (MISCELLANEOUS) ×1 IMPLANT
BLADE SURG SZ11 CARB STEEL (BLADE) ×1 IMPLANT
BURR OVAL 8 FLU 4.0X13 (MISCELLANEOUS) IMPLANT
CANNULA 5.75X7 CRYSTAL CLEAR (CANNULA) IMPLANT
CANNULA ACUFO 5X76 (CANNULA) IMPLANT
CLEANER TIP ELECTROSURG 2X2 (MISCELLANEOUS) IMPLANT
COVER SURGICAL LIGHT HANDLE (MISCELLANEOUS) ×1 IMPLANT
DRAPE FOOT SWITCH (DRAPES) ×2 IMPLANT
DRAPE IMP U-DRAPE 54X76 (DRAPES) ×1 IMPLANT
DRAPE POUCH INSTRU U-SHP 10X18 (DRAPES) ×1 IMPLANT
DRAPE STERI 35X30 U-POUCH (DRAPES) ×1 IMPLANT
DRAPE SURG ORHT 6 SPLT 77X108 (DRAPES) ×2 IMPLANT
DRSG AQUACEL AG ADV 3.5X 4 (GAUZE/BANDAGES/DRESSINGS) IMPLANT
DRSG AQUACEL AG ADV 3.5X 6 (GAUZE/BANDAGES/DRESSINGS) IMPLANT
DURAPREP 26ML APPLICATOR (WOUND CARE) ×1 IMPLANT
DW OUTFLOW CASSETTE/TUBE SET (MISCELLANEOUS) ×1 IMPLANT
ELECT NDL TIP 2.8 STRL (NEEDLE) ×1 IMPLANT
ELECT NEEDLE TIP 2.8 STRL (NEEDLE) ×1 IMPLANT
ELECT REM PT RETURN 15FT ADLT (MISCELLANEOUS) ×1 IMPLANT
FILTER STRAW (MISCELLANEOUS) ×1 IMPLANT
GAUZE PAD ABD 8X10 STRL (GAUZE/BANDAGES/DRESSINGS) IMPLANT
GAUZE SPONGE 4X4 12PLY STRL (GAUZE/BANDAGES/DRESSINGS) IMPLANT
GLOVE BIOGEL PI IND STRL 7.0 (GLOVE) ×1 IMPLANT
GLOVE BIOGEL PI IND STRL 8 (GLOVE) ×1 IMPLANT
GLOVE SURG SS PI 8.0 STRL IVOR (GLOVE) ×2 IMPLANT
GOWN STRL REUS W/ TWL XL LVL3 (GOWN DISPOSABLE) ×2 IMPLANT
KIT BASIN OR (CUSTOM PROCEDURE TRAY) ×1 IMPLANT
KIT TURNOVER KIT A (KITS) IMPLANT
MANIFOLD NEPTUNE II (INSTRUMENTS) ×1 IMPLANT
NDL HD SCORPION MEGA LOADER (NEEDLE) IMPLANT
NDL SPNL 18GX3.5 QUINCKE PK (NEEDLE) ×1 IMPLANT
NEEDLE SPNL 18GX3.5 QUINCKE PK (NEEDLE) ×1 IMPLANT
PACK SHOULDER (CUSTOM PROCEDURE TRAY) ×1 IMPLANT
PROTECTOR NERVE ULNAR (MISCELLANEOUS) ×1 IMPLANT
RESTRAINT HEAD UNIVERSAL NS (MISCELLANEOUS) ×1 IMPLANT
SLING ARM IMMOBILIZER LRG (SOFTGOODS) IMPLANT
SLING ARM IMMOBILIZER MED (SOFTGOODS) IMPLANT
SLING ULTRA II L (ORTHOPEDIC SUPPLIES) IMPLANT
STRIP CLOSURE SKIN 1/2X4 (GAUZE/BANDAGES/DRESSINGS) IMPLANT
SUCTION TUBE FRAZIER 12FR DISP (SUCTIONS) ×1 IMPLANT
SUT ETHIBOND NAB CT1 #1 30IN (SUTURE) IMPLANT
SUT ETHILON 4 0 PS 2 18 (SUTURE) ×1 IMPLANT
SUT FIBERWIRE #2 38 T-5 BLUE (SUTURE)
SUT PROLENE 3 0 PS 2 (SUTURE) ×1 IMPLANT
SUT TIGER TAPE 7 IN WHITE (SUTURE) IMPLANT
SUT VIC AB 0 CT1 27XBRD ANTBC (SUTURE) ×1 IMPLANT
SUT VIC AB 1-0 CT2 27 (SUTURE) IMPLANT
SUT VIC AB 2-0 CT2 27 (SUTURE) IMPLANT
SUT VICRYL 0 UR6 27IN ABS (SUTURE) ×1 IMPLANT
SUTURE FIBERWR #2 38 T-5 BLUE (SUTURE) IMPLANT
SYR 20ML LL LF (SYRINGE) ×1 IMPLANT
TAPE FIBER 2MM 7IN #2 BLUE (SUTURE) IMPLANT
TOWEL OR 17X26 10 PK STRL BLUE (TOWEL DISPOSABLE) ×1 IMPLANT
TUBING ARTHROSCOPY IRRIG 16FT (MISCELLANEOUS) ×1 IMPLANT
TUBING CONNECTING 10 (TUBING) ×1 IMPLANT
WAND ABLATOR APOLLO I90 (BUR) ×1 IMPLANT
WIPE CHG 2% PREP (PERSONAL CARE ITEMS) ×1 IMPLANT

## 2023-03-28 NOTE — Transfer of Care (Signed)
Immediate Anesthesia Transfer of Care Note  Patient: Brenda Mcmahon  Procedure(s) Performed: SHOULDER ARTHROSCOPY  AND SUBACROMIAL DECOMPRESSION DEBRIDEMENT (Right)  Patient Location: PACU  Anesthesia Type:General  Level of Consciousness: drowsy  Airway & Oxygen Therapy: Patient Spontanous Breathing and Patient connected to face mask oxygen  Post-op Assessment: Report given to RN and Post -op Vital signs reviewed and stable  Post vital signs: Reviewed and stable  Last Vitals:  Vitals Value Taken Time  BP 108/62 03/28/23 1315  Temp 36.4 C 03/28/23 1312  Pulse 74 03/28/23 1316  Resp 14 03/28/23 1316  SpO2 98 % 03/28/23 1316  Vitals shown include unfiled device data.  Last Pain:  Vitals:   03/28/23 1130  TempSrc:   PainSc: 0-No pain      Patients Stated Pain Goal: 2 (03/28/23 0949)  Complications: No notable events documented.

## 2023-03-28 NOTE — Anesthesia Postprocedure Evaluation (Signed)
Anesthesia Post Note  Patient: Brenda Mcmahon  Procedure(s) Performed: SHOULDER ARTHROSCOPY  AND SUBACROMIAL DECOMPRESSION DEBRIDEMENT (Right)     Patient location during evaluation: PACU Anesthesia Type: Regional and General Level of consciousness: awake and alert Pain management: pain level controlled Vital Signs Assessment: post-procedure vital signs reviewed and stable Respiratory status: spontaneous breathing, nonlabored ventilation, respiratory function stable and patient connected to nasal cannula oxygen Cardiovascular status: blood pressure returned to baseline and stable Postop Assessment: no apparent nausea or vomiting Anesthetic complications: no   No notable events documented.  Last Vitals:  Vitals:   03/28/23 1521 03/28/23 1545  BP: 118/72 114/71  Pulse: 69 63  Resp: 15 16  Temp: 36.6 C 36.6 C  SpO2: 96% 96%    Last Pain:  Vitals:   03/28/23 1533  TempSrc:   PainSc: 5                  Trevor Iha

## 2023-03-28 NOTE — Op Note (Unsigned)
NAME: Brenda Mcmahon, GAUL MEDICAL RECORD NO: [id number] ACCOUNT NO: [id number] DATE OF BIRTH: 31-Dec-1990 FACILITY: [facility] LOCATION: [facility] PHYSICIAN: Javier Docker, MD  Operative Report   DATE OF PROCEDURE: 03/28/2023  PREOPERATIVE DIAGNOSES: 1.  Impingement syndrome, right shoulder. 2.  Bursal-sided tear of the rotator cuff.  POSTOPERATIVE DIAGNOSES: 1.  Impingement syndrome, right shoulder. 2.  Bursal-sided tear of the rotator cuff. 3.  Anterior labral tear. 4.  Superior labral tear.  PROCEDURE PERFORMED: 1.  Right shoulder arthroscopy. 2.  Debridement of anterior-superior labral tear. 3.  Subacromial decompression with release of the CA ligament, subdeltoid and subacromial bursectomy, debridement of supraspinatus tendon with extensive debridement.  ANESTHESIA:  General.  ASSISTANT:  Orland Penman, PA.  HISTORY:  A 32 year old with impingement pain in the right shoulder, small bursal-sided tear of the rotator cuff, impingement pain, refractory to conservative treatment indicated for shoulder arthroscopy, subacromial decompression, and debridement.   Risks and benefits were discussed including bleeding, infection, damage to neurovascular structures, no change in symptoms, worsening symptoms, DVT, PE, anesthetic complications, and possible open rotator cuff repair.  DESCRIPTION OF PROCEDURE:  The patient was brought in the beach chair position.  After the induction of adequate general anesthesia, 2 g of Kefzol the right shoulder and upper extremity was prepped and draped in the usual sterile fashion.  Surgical  marker utilized to delineate the acromion, AC joint, and the coracoid.  A standard posterolateral portal was utilized with an incision through the skin only with a #11 blade.  With the arm in the 70/30 position, gentle traction was applied.  We advanced  the arthroscopic cannula in the glenohumeral joint in line with the coracoid, penetrated atraumatically.  Irrigant was  utilized to insufflated joint 35 mmHg.  Noted with some minor chondromalacia of the glenoid.  The biceps tendon was intact.  There was  a small radial tear of the superior and anterior labrum.  Subscap was unremarkable.  The rotator cuff was unremarkable.  Under direct visualization, an anterior portal was fashioned with a #11 blade after localization with an 18-gauge needle halfway between the coracoid and the anterolateral aspect of the acromion under direct visualization beneath the biceps tendon and  the rotator cuff interval.  Portal was fashioned with a #11 blade.  We advanced the cannula under direct visualization underneath the biceps tendon and the glenohumeral joint, introduced a shaver and debrided the anterior-superior labrum.  This was not  detached.  The biceps was unremarkable.  I then redirected the camera in the subacromial space.  An anterolateral portal was utilized and triangulated in the subacromial space.  Insufflated the joint at 35 mmHg.  Noted was hypertrophic bursitis, subacromial and subdeltoid, we performed full  bursectomy, subacromial and subdeltoid.  Hypertrophic coracoacromial ligament.  This was partially released with an ArthroWand.  There was a very small bursal-sided tearing of the supraspinatus approximately 10%.  This was debrided.  Good bleeding  tissue.  I inspected the remainder of the cuff.  It was unremarkable.  There was ample subacromial space.  After full bursectomy, we removed all instrumentation.  Portals were closed with 4-0 nylon simple suture.  0.25% Marcaine with epinephrine was  infiltrated in the joint.  The wound was dressed sterilely, placed in a sling, extubated, and transported to the recovery room in satisfactory condition.   The patient tolerated the procedure well.  No complications.  Assistant Orland Penman, [place] was used throughout the case for patient positioning, applying traction to the arm and monitoring  inflow and  outflow.   PUS D: 03/28/2023 1:02:25 pm T: 03/28/2023 8:01:00 pm  JOB: [id number]/ [id number]

## 2023-03-28 NOTE — Interval H&P Note (Signed)
History and Physical Interval Note:  03/28/2023 11:04 AM  Brenda Mcmahon  has presented today for surgery, with the diagnosis of right shoulder impengment syndrome partial rotator cuff tear.  The various methods of treatment have been discussed with the patient and family. After consideration of risks, benefits and other options for treatment, the patient has consented to  Procedure(s): SHOULDER ARTHROSCOPY  AND SUBACROMIAL DECOMPRESSION DEBRIDEMENT POSSIBLE MINI OPEN ROTATOR CUFF REPAIR (Right) as a surgical intervention.  The patient's history has been reviewed, patient examined, no change in status, stable for surgery.  I have reviewed the patient's chart and labs.  Questions were answered to the patient's satisfaction.     Javier Docker

## 2023-03-28 NOTE — Anesthesia Procedure Notes (Addendum)
Anesthesia Regional Block: Interscalene brachial plexus block   Pre-Anesthetic Checklist: , timeout performed,  Correct Patient, Correct Site, Correct Laterality,  Correct Procedure, Correct Position, site marked,  Risks and benefits discussed,  Surgical consent,  Pre-op evaluation,  At surgeon's request and post-op pain management  Laterality: Upper and Right  Prep: Maximum Sterile Barrier Precautions used, chloraprep       Needles:  Injection technique: Single-shot  Needle Type: Echogenic Needle     Needle Length: 5cm  Needle Gauge: 21     Additional Needles:   Procedures:,,,, ultrasound used (permanent image in chart),,    Narrative:  Start time: 03/28/2023 10:50 AM End time: 03/28/2023 10:55 AM Injection made incrementally with aspirations every 5 mL.  Performed by: Personally  Anesthesiologist: Trevor Iha, MD  Additional Notes: Block assessed prior to procedure. Patient tolerated procedure well.

## 2023-03-28 NOTE — Anesthesia Procedure Notes (Signed)
Procedure Name: Intubation Date/Time: 03/28/2023 11:56 AM  Performed by: Jamelle Rushing, CRNAPre-anesthesia Checklist: Patient identified, Emergency Drugs available, Suction available, Patient being monitored and Timeout performed Patient Re-evaluated:Patient Re-evaluated prior to induction Oxygen Delivery Method: Circle system utilized Preoxygenation: Pre-oxygenation with 100% oxygen Induction Type: IV induction Ventilation: Mask ventilation without difficulty Laryngoscope Size: Mac and 3 Grade View: Grade I Tube type: Oral Tube size: 7.0 mm Number of attempts: 1 Airway Equipment and Method: Stylet Placement Confirmation: ETT inserted through vocal cords under direct vision, positive ETCO2 and breath sounds checked- equal and bilateral Secured at: 21 cm Tube secured with: Tape Dental Injury: Teeth and Oropharynx as per pre-operative assessment

## 2023-03-28 NOTE — Brief Op Note (Signed)
03/28/2023  12:56 PM  PATIENT:  Brenda Mcmahon  32 y.o. female  PRE-OPERATIVE DIAGNOSIS:  right shoulder impengment syndrome partial rotator cuff tear  POST-OPERATIVE DIAGNOSIS:  right shoulder impengment syndrome partial rotator cuff tear  PROCEDURE:  Procedure(s): SHOULDER ARTHROSCOPY  AND SUBACROMIAL DECOMPRESSION DEBRIDEMENT (Right)  SURGEON:  Surgeons and Role:    Jene Every, MD - Primary  PHYSICIAN ASSISTANT:   ASSISTANTS: [place]   ANESTHESIA:   general  EBL:  min   BLOOD ADMINISTERED:none  DRAINS: none   LOCAL MEDICATIONS USED:  MARCAINE     SPECIMEN:  No Specimen  DISPOSITION OF SPECIMEN:  N/A  COUNTS:  YES  TOURNIQUET:  * No tourniquets in log *  DICTATION: .Other Dictation: Dictation Number [id number]  PLAN OF CARE: Discharge to home after PACU  PATIENT DISPOSITION:  PACU - hemodynamically stable.   Delay start of Pharmacological VTE agent (>24hrs) due to surgical blood loss or risk of bleeding: no

## 2023-03-29 ENCOUNTER — Encounter (HOSPITAL_COMMUNITY): Payer: Self-pay | Admitting: Specialist

## 2023-03-29 ENCOUNTER — Other Ambulatory Visit (HOSPITAL_COMMUNITY): Payer: Self-pay

## 2023-03-29 MED ORDER — ONDANSETRON 8 MG PO TBDP
8.0000 mg | ORAL_TABLET | Freq: Three times a day (TID) | ORAL | 1 refills | Status: DC | PRN
  Filled 2023-03-29: qty 30, 10d supply, fill #0

## 2023-04-09 DIAGNOSIS — F4312 Post-traumatic stress disorder, chronic: Secondary | ICD-10-CM | POA: Diagnosis not present

## 2023-04-09 DIAGNOSIS — F332 Major depressive disorder, recurrent severe without psychotic features: Secondary | ICD-10-CM | POA: Diagnosis not present

## 2023-04-13 DIAGNOSIS — F4312 Post-traumatic stress disorder, chronic: Secondary | ICD-10-CM | POA: Diagnosis not present

## 2023-04-13 DIAGNOSIS — F332 Major depressive disorder, recurrent severe without psychotic features: Secondary | ICD-10-CM | POA: Diagnosis not present

## 2023-04-16 DIAGNOSIS — F4312 Post-traumatic stress disorder, chronic: Secondary | ICD-10-CM | POA: Diagnosis not present

## 2023-04-16 DIAGNOSIS — F332 Major depressive disorder, recurrent severe without psychotic features: Secondary | ICD-10-CM | POA: Diagnosis not present

## 2023-04-18 NOTE — Therapy (Signed)
 OUTPATIENT PHYSICAL THERAPY EVALUATION   Patient Name: Brenda Mcmahon MRN: [id number] DOB:[birth year], 33 y.o., female Today's Date: 04/19/2023   END OF SESSION:  PT End of Session - 04/19/23 1049     Visit Number 1    Number of Visits 17    Date for PT Re-Evaluation 06/14/23    Authorization Type Worker's Comp    PT Start Time 1100    PT Stop Time 1145    PT Time Calculation (min) 45 min    Activity Tolerance Patient tolerated treatment well    Behavior During Therapy WFL for tasks assessed/performed             Past Medical History:  Diagnosis Date   Anxiety    Depression    Dysrhythmia    Heart murmur    Palpitation    Past Surgical History:  Procedure Laterality Date   FOOT FRACTURE SURGERY Right 2015   also ankle fracture repaired.    SHOULDER ARTHROSCOPY WITH ROTATOR CUFF REPAIR AND SUBACROMIAL DECOMPRESSION Right 03/28/2023   Procedure: SHOULDER ARTHROSCOPY  AND SUBACROMIAL DECOMPRESSION DEBRIDEMENT;  Surgeon: Duwayne Purchase, MD;  Location: [facility];  Service: Orthopedics;  Laterality: Right;   WRIST FRACTURE SURGERY Left    33 yo.  fell through glass door.    Patient Active Problem List   Diagnosis Date Noted   Chronic right shoulder pain 03/16/2023   Other insomnia 10/14/2022   Routine medical exam 09/18/2021   Need for DTaP vaccine 09/18/2021   Moderate recurrent major depression (HCC) 08/07/2021   GAD (generalized anxiety disorder) 08/07/2021   Encounter for other contraceptive management 08/07/2021   Palpitation 11/10/2020   Achilles bursitis of right lower extremity 03/14/2017   Recurrent major depression-severe (HCC) 03/14/2017    PCP: Sherre Clapper, MD  REFERRING PROVIDER: Duwayne Purchase, MD  REFERRING DIAG: Encounter for other specified surgical aftercare   THERAPY DIAG:  Chronic right shoulder pain  Muscle weakness (generalized)  Rationale for Evaluation and Treatment: Rehabilitation  ONSET DATE: 03/28/2023   SUBJECTIVE:                            SUBJECTIVE STATEMENT: Patient reports she had surgery for her right shoulder on 03/28/2023. She does report her right shoulder is throbbing. She has been moving the arm and is using it from light activities around her home, but has been avoiding reaching overhead with the right arm. She does report she is probably sleeping on the right side so she has recently been sleeping on the couch.   Hand dominance: Ambidextrous  PERTINENT HISTORY: Right shoulder arthroscopy 03/28/2023  PAIN:  Are you having pain? Yes:  NPRS scale: 7/10  Pain location: Right shoulder Pain description: Throbbing Aggravating factors: Reaching overhead or behind back Relieving factors: Heat, medication  PRECAUTIONS: None  RED FLAGS: None   WEIGHT BEARING RESTRICTIONS: No  FALLS:  Has patient fallen in last 6 months? No  OCCUPATION: [employer], pharmacy technician; currently out on FMLA   PLOF: Independent  PATIENT GOALS: Pain relief, be able to use the right arm, return to prior level of function    OBJECTIVE:  Note: Objective measures were completed at Evaluation unless otherwise noted. PATIENT SURVEYS:  FOTO 51% functional status  COGNITION: Overall cognitive status: Within functional limits for tasks assessed     SENSATION: WFL  POSTURE: Rounded shoulder posture, she keeps right arm guarded by side  UPPER EXTREMITY ROM:   Active  ROM Right eval Left eval  Shoulder flexion 90 170  Shoulder extension 40 60  Shoulder abduction 70 180  Shoulder adduction    Shoulder internal rotation Sacrum T6  Shoulder external rotation 65 / occiput 85 / T4  Elbow flexion    Elbow extension    Wrist flexion    Wrist extension    Wrist ulnar deviation    Wrist radial deviation    Wrist pronation    Wrist supination    (Blank rows = not tested)  Passive ROM Right eval Left eval  Shoulder flexion 150   Shoulder extension    Shoulder abduction 120   Shoulder adduction     Shoulder internal rotation 50 @ 90   Shoulder external rotation 70 @ 0 and 90   Elbow flexion    Elbow extension    Wrist flexion    Wrist extension    Wrist ulnar deviation    Wrist radial deviation    Wrist pronation    Wrist supination    (Blank rows = not tested)  UPPER EXTREMITY MMT:  MMT Right eval Left eval  Shoulder flexion    Shoulder extension    Shoulder abduction    Shoulder adduction    Shoulder internal rotation    Shoulder external rotation    Middle trapezius    Lower trapezius    Elbow flexion    Elbow extension    Wrist flexion    Wrist extension    Wrist ulnar deviation    Wrist radial deviation    Wrist pronation    Wrist supination    Grip strength (lbs)    (Blank rows = not tested)  Strength not assessed at eval  JOINT MOBILITY TESTING:  Hypomobility of right GHJ  PALPATION:  Generalized tenderness of right shoulder                                                                                                                             TREATMENT DATE: [facility] Adult PT Treatment:                                                DATE: 04/19/2023 Therapeutic Exercise: Supine dowel press to overhead reach 5 x 5 sec Supine dowel ER 5 x 5 sec Sidelying shoulder ER x 10 Seated table slide shoulder flexion 5 x 5 sec Seated shoulder blade squeezes  PATIENT EDUCATION: Education details: Exam findings, POC, HEP Person educated: Patient Education method: Programmer, Multimedia, Demonstration, Tactile cues, Verbal cues, and Handouts Education comprehension: verbalized understanding, returned demonstration, verbal cues required, tactile cues required, and needs further education  HOME EXERCISE PROGRAM: Access Code: [email or web address]   ASSESSMENT: CLINICAL IMPRESSION: Patient is a 33 y.o. female who was seen today for physical therapy evaluation and treatment for right shoulder pain following arthroscopy  on 03/28/2023. Currently she demonstrates limitations with her  right shoulder motion, strength not assessed this visit due to pain. She was provided exercises to initiate shoulder mobility and light rotator cuff work with good tolerance.    OBJECTIVE IMPAIRMENTS: decreased activity tolerance, decreased ROM, decreased strength, impaired UE functional use, postural dysfunction, and pain.   ACTIVITY LIMITATIONS: carrying, lifting, sleeping, bathing, dressing, reach over head, and hygiene/grooming  PARTICIPATION LIMITATIONS: meal prep, cleaning, laundry, shopping, community activity, and occupation  PERSONAL FACTORS: Past/current experiences and Time since onset of injury/illness/exacerbation are also affecting patient's functional outcome.   REHAB POTENTIAL: Good  CLINICAL DECISION MAKING: Stable/uncomplicated  EVALUATION COMPLEXITY: Low   GOALS: Goals reviewed with patient? Yes  SHORT TERM GOALS: Target date: 05/17/2023  Patient will be I with initial HEP in order to progress with therapy. Baseline: HEP provided at eval Goal status: INITIAL  2.  Patient will report right shoulder pain </= 4/10 in order to reduce functional limitations Baseline: 7/10 Goal status: INITIAL  3.  Patient will demonstrate right shoulder active elevation >/= 120 deg in order to improve self care ability Baseline: 90 deg Goal status: INITIAL  LONG TERM GOALS: Target date: 06/14/2023  Patient will be I with final HEP to maintain progress from PT.  Baseline: HEP provided at eval Goal status: INITIAL  2.  Patient will report >/= 65% status on FOTO to indicate improved functional ability. Baseline: 51% functional status Goal status: INITIAL  3.  Patient will demonstrate right shoulder active elevation >/= 160 deg to normalize overhead reach  Baseline: 90 deg Goal status: INITIAL  4.  Patient will be able to reach behind back to T10 in order to improve dressing and bathing ability Baseline: Sacrum Goal status: INITIAL  5. Patient will demonstrate right  rotator cuff strength 5/5 MMT in order to improve lifting and carrying with the right arm  Basline: not assessed at eval  Goal status: INITIAL   PLAN: PT FREQUENCY: 1-2x/week  PT DURATION: 8 weeks  PLANNED INTERVENTIONS: 97164- PT Re-evaluation, 97110-Therapeutic exercises, 97530- Therapeutic activity, 97112- Neuromuscular re-education, 97535- Self Care, 02859- Manual therapy, [id number]- Aquatic Therapy, 97014- Electrical stimulation (unattended), [id number]- Electrical stimulation (manual), 97016- Vasopneumatic device, 97033- Ionotophoresis 4mg /ml Dexamethasone , Patient/Family education, Balance training, Taping, Dry Needling, Joint mobilization, Joint manipulation, Spinal manipulation, Spinal mobilization, Cryotherapy, and Moist heat  PLAN FOR NEXT SESSION: Review HEP and progress PRN, manual/mobs/PROM for right shoulder motion, progress AAROM>AROM as tolerated, progress right shoulder strength and postural control as tolerated   Elaine Daring, PT, DPT, LAT, ATC 04/19/23  1:03 PM Phone: [phone] Fax: [phone]

## 2023-04-19 ENCOUNTER — Other Ambulatory Visit: Payer: Self-pay

## 2023-04-19 ENCOUNTER — Encounter: Payer: Self-pay | Admitting: Physical Therapy

## 2023-04-19 ENCOUNTER — Ambulatory Visit: Payer: PRIVATE HEALTH INSURANCE | Attending: Specialist | Admitting: Physical Therapy

## 2023-04-19 DIAGNOSIS — G8929 Other chronic pain: Secondary | ICD-10-CM | POA: Insufficient documentation

## 2023-04-19 DIAGNOSIS — M25511 Pain in right shoulder: Secondary | ICD-10-CM | POA: Insufficient documentation

## 2023-04-19 DIAGNOSIS — M6281 Muscle weakness (generalized): Secondary | ICD-10-CM | POA: Insufficient documentation

## 2023-04-19 NOTE — Patient Instructions (Signed)
 Access Code: [email or web address] URL: [email or web address] Date: 04/19/2023 Prepared by: Elaine Daring  Exercises - Supine Shoulder Press with Dowel  - 3 x daily - 10 reps - 5 seconds hold - Supine Shoulder External Rotation with Dowel  - 3 x daily - 10 reps - 5 seconds hold - Sidelying Shoulder External Rotation  - 3 x daily - 10 reps - Seated Bilateral Shoulder Flexion Towel Slide at Table Top  - 3 x daily - 10 reps - 5 seconds hold - Seated Scapular Retraction  - 3 x daily - 10 reps - 5 seconds hold

## 2023-04-24 ENCOUNTER — Ambulatory Visit: Payer: PRIVATE HEALTH INSURANCE | Admitting: Physical Therapy

## 2023-04-27 ENCOUNTER — Encounter: Payer: Self-pay | Admitting: Physical Therapy

## 2023-04-27 ENCOUNTER — Ambulatory Visit: Payer: PRIVATE HEALTH INSURANCE | Attending: Specialist | Admitting: Physical Therapy

## 2023-04-27 ENCOUNTER — Other Ambulatory Visit: Payer: Self-pay

## 2023-04-27 DIAGNOSIS — M6281 Muscle weakness (generalized): Secondary | ICD-10-CM | POA: Insufficient documentation

## 2023-04-27 DIAGNOSIS — G8929 Other chronic pain: Secondary | ICD-10-CM | POA: Insufficient documentation

## 2023-04-27 DIAGNOSIS — F4312 Post-traumatic stress disorder, chronic: Secondary | ICD-10-CM | POA: Diagnosis not present

## 2023-04-27 DIAGNOSIS — M25511 Pain in right shoulder: Secondary | ICD-10-CM | POA: Diagnosis present

## 2023-04-27 DIAGNOSIS — F332 Major depressive disorder, recurrent severe without psychotic features: Secondary | ICD-10-CM | POA: Diagnosis not present

## 2023-04-27 NOTE — Patient Instructions (Signed)
 Access Code: [email or web address] URL: [email or web address] Date: 04/27/2023 Prepared by: Elaine Daring  Exercises - Supine Shoulder Press with Dowel  - 3 x daily - 10 reps - 5 seconds hold - Supine Shoulder External Rotation with Dowel  - 3 x daily - 10 reps - 5 seconds hold - Sidelying Shoulder External Rotation  - 3 x daily - 10 reps - Seated Bilateral Shoulder Flexion Towel Slide at Table Top  - 3 x daily - 10 reps - 5 seconds hold - Seated Scapular Retraction  - 3 x daily - 10 reps - 5 seconds hold - Standing shoulder flexion wall slides  - 2 x daily - 2 sets - 10 reps

## 2023-04-27 NOTE — Therapy (Signed)
 OUTPATIENT PHYSICAL THERAPY TREATMENT   Patient Name: Brenda Mcmahon MRN: [id number] DOB:1990-06-22, 33 y.o., female Today's Date: 04/27/2023   END OF SESSION:  PT End of Session - 04/27/23 0852     Visit Number 2    Number of Visits 17    Date for PT Re-Evaluation 06/14/23    Authorization Type Worker's Comp    Authorization - Visit Number 2    Authorization - Number of Visits 8    PT Start Time 0800    PT Stop Time 0845    PT Time Calculation (min) 45 min    Activity Tolerance Patient tolerated treatment well    Behavior During Therapy WFL for tasks assessed/performed              Past Medical History:  Diagnosis Date   Anxiety    Depression    Dysrhythmia    Heart murmur    Palpitation    Past Surgical History:  Procedure Laterality Date   FOOT FRACTURE SURGERY Right 2015   also ankle fracture repaired.    SHOULDER ARTHROSCOPY WITH ROTATOR CUFF REPAIR AND SUBACROMIAL DECOMPRESSION Right 03/28/2023   Procedure: SHOULDER ARTHROSCOPY  AND SUBACROMIAL DECOMPRESSION DEBRIDEMENT;  Surgeon: Duwayne Purchase, MD;  Location: [facility];  Service: Orthopedics;  Laterality: Right;   WRIST FRACTURE SURGERY Left    33 yo.  fell through glass door.    Patient Active Problem List   Diagnosis Date Noted   Chronic right shoulder Mcmahon 03/16/2023   Other insomnia 10/14/2022   Routine medical exam 09/18/2021   Need for DTaP vaccine 09/18/2021   Moderate recurrent major depression (HCC) 08/07/2021   GAD (generalized anxiety disorder) 08/07/2021   Encounter for other contraceptive management 08/07/2021   Palpitation 11/10/2020   Achilles bursitis of right lower extremity 03/14/2017   Recurrent major depression-severe (HCC) 03/14/2017    PCP: Sherre Clapper, MD  REFERRING PROVIDER: Duwayne Purchase, MD  REFERRING DIAG: Encounter for other specified surgical aftercare   THERAPY DIAG:  Chronic right shoulder Mcmahon  Muscle weakness (generalized)  Rationale for Evaluation and  Treatment: Rehabilitation  ONSET DATE: 03/28/2023   SUBJECTIVE:                           SUBJECTIVE STATEMENT: Patient reports she is doing alright, she does have a little headache today. She did do a lot of household tasks that kind of aggravated her shoulder.  Hand dominance: Ambidextrous  PERTINENT HISTORY: Right shoulder arthroscopy 03/28/2023  Mcmahon:  Are you having Mcmahon? Yes:  NPRS scale: 8/10  Mcmahon location: Right shoulder Mcmahon description: Throbbing Aggravating factors: Reaching overhead or behind back Relieving factors: Heat, medication  PRECAUTIONS: None  PATIENT GOALS: Mcmahon relief, be able to use the right arm, return to prior level of function    OBJECTIVE:  Note: Objective measures were completed at Evaluation unless otherwise noted. PATIENT SURVEYS:  FOTO 51% functional status  POSTURE: Rounded shoulder posture, she keeps right arm guarded by side  UPPER EXTREMITY ROM:   Active ROM Right eval Left eval Right 04/27/2023  Shoulder flexion 90 170 123  Shoulder extension 40 60   Shoulder abduction 70 180   Shoulder adduction     Shoulder internal rotation Sacrum T6   Shoulder external rotation 65 / occiput 85 / T4   Elbow flexion     Elbow extension     Wrist flexion     Wrist extension  Wrist ulnar deviation     Wrist radial deviation     Wrist pronation     Wrist supination     (Blank rows = not tested)  Passive ROM Right eval Left eval  Shoulder flexion 150   Shoulder extension    Shoulder abduction 120   Shoulder adduction    Shoulder internal rotation 50 @ 90   Shoulder external rotation 70 @ 0 and 90   Elbow flexion    Elbow extension    Wrist flexion    Wrist extension    Wrist ulnar deviation    Wrist radial deviation    Wrist pronation    Wrist supination    (Blank rows = not tested)  UPPER EXTREMITY MMT:  MMT Right eval Left eval  Shoulder flexion    Shoulder extension    Shoulder abduction    Shoulder adduction     Shoulder internal rotation    Shoulder external rotation    Middle trapezius    Lower trapezius    Elbow flexion    Elbow extension    Wrist flexion    Wrist extension    Wrist ulnar deviation    Wrist radial deviation    Wrist pronation    Wrist supination    Grip strength (lbs)    (Blank rows = not tested)  Strength not assessed at eval  JOINT MOBILITY TESTING:  Hypomobility of right GHJ  PALPATION:  Generalized tenderness of right shoulder                                                                                                                             TREATMENT DATE: [facility] Adult PT Treatment:                                                DATE: 04/27/2023 Therapeutic Exercise: Step back stretch at counter 5 x 5 sec Supine dowel press to overhead reach 5 x 5 sec Standing wall slide 2 x 10 Sidelying shoulder ER with 1# 2 x 10 Row single arm with blue x 10 Manual: Right GHJ mobs primarily inferior and posterior at various ranges of elevation Right shoulder PROM Sidelying right scapulothoracic mobs   [facility] Adult PT Treatment:                                                DATE: 04/19/2023 Therapeutic Exercise: Supine dowel press to overhead reach 5 x 5 sec Supine dowel ER 5 x 5 sec Sidelying shoulder ER x 10 Seated table slide shoulder flexion 5 x 5 sec Seated shoulder blade squeezes  PATIENT EDUCATION: Education details: HEP update Person educated: Patient Education method: Explanation,  Demonstration, Tactile cues, Verbal cues, and Handouts Education comprehension: verbalized understanding, returned demonstration, verbal cues required, tactile cues required, and needs further education  HOME EXERCISE PROGRAM: Access Code: [email or web address]   ASSESSMENT: CLINICAL IMPRESSION: Patient tolerated therapy well with no adverse effects. Therapy focused on progressing right shoulder mobility and rotator cuff strengthening. She does exhibit improvement in her shoulder  elevation this visit but remains limited in all planes due to Mcmahon. She was able to progress with rotator cuff and periscapular strengthening using resistance but did report muscular fatigue with exercises. Updated her HEP to progress upright shoulder motion. Patient would benefit from continued skilled PT to progress her shoulder mobility and strength in order to reduce Mcmahon and maximize functional ability.  EVAL: Patient is a 33 y.o. female who was seen today for physical therapy evaluation and treatment for right shoulder Mcmahon following arthroscopy on 03/28/2023. Currently she demonstrates limitations with her right shoulder motion, strength not assessed this visit due to Mcmahon. She was provided exercises to initiate shoulder mobility and light rotator cuff work with good tolerance.    OBJECTIVE IMPAIRMENTS: decreased activity tolerance, decreased ROM, decreased strength, impaired UE functional use, postural dysfunction, and Mcmahon.   ACTIVITY LIMITATIONS: carrying, lifting, sleeping, bathing, dressing, reach over head, and hygiene/grooming  PARTICIPATION LIMITATIONS: meal prep, cleaning, laundry, shopping, community activity, and occupation  PERSONAL FACTORS: Past/current experiences and Time since onset of injury/illness/exacerbation are also affecting patient's functional outcome.    GOALS: Goals reviewed with patient? Yes  SHORT TERM GOALS: Target date: 05/17/2023  Patient will be I with initial HEP in order to progress with therapy. Baseline: HEP provided at eval Goal status: INITIAL  2.  Patient will report right shoulder Mcmahon </= 4/10 in order to reduce functional limitations Baseline: 7/10 Goal status: INITIAL  3.  Patient will demonstrate right shoulder active elevation >/= 120 deg in order to improve self care ability Baseline: 90 deg Goal status: INITIAL  LONG TERM GOALS: Target date: 06/14/2023  Patient will be I with final HEP to maintain progress from PT.  Baseline: HEP  provided at eval Goal status: INITIAL  2.  Patient will report >/= 65% status on FOTO to indicate improved functional ability. Baseline: 51% functional status Goal status: INITIAL  3.  Patient will demonstrate right shoulder active elevation >/= 160 deg to normalize overhead reach  Baseline: 90 deg Goal status: INITIAL  4.  Patient will be able to reach behind back to T10 in order to improve dressing and bathing ability Baseline: Sacrum Goal status: INITIAL  5. Patient will demonstrate right rotator cuff strength 5/5 MMT in order to improve lifting and carrying with the right arm  Basline: not assessed at eval  Goal status: INITIAL   PLAN: PT FREQUENCY: 1-2x/week  PT DURATION: 8 weeks  PLANNED INTERVENTIONS: 97164- PT Re-evaluation, 97110-Therapeutic exercises, 97530- Therapeutic activity, 97112- Neuromuscular re-education, 97535- Self Care, 02859- Manual therapy, [id number]- Aquatic Therapy, 97014- Electrical stimulation (unattended), [id number]- Electrical stimulation (manual), 97016- Vasopneumatic device, 97033- Ionotophoresis 4mg /ml Dexamethasone , Patient/Family education, Balance training, Taping, Dry Needling, Joint mobilization, Joint manipulation, Spinal manipulation, Spinal mobilization, Cryotherapy, and Moist heat  PLAN FOR NEXT SESSION: Review HEP and progress PRN, manual/mobs/PROM for right shoulder motion, progress AAROM>AROM as tolerated, progress right shoulder strength and postural control as tolerated   Elaine Daring, PT, DPT, LAT, ATC 04/27/23  9:05 AM Phone: [phone] Fax: [phone]

## 2023-04-30 DIAGNOSIS — F332 Major depressive disorder, recurrent severe without psychotic features: Secondary | ICD-10-CM | POA: Diagnosis not present

## 2023-04-30 DIAGNOSIS — F4312 Post-traumatic stress disorder, chronic: Secondary | ICD-10-CM | POA: Diagnosis not present

## 2023-05-01 ENCOUNTER — Encounter: Payer: Self-pay | Admitting: Physical Therapy

## 2023-05-01 ENCOUNTER — Other Ambulatory Visit: Payer: Self-pay

## 2023-05-01 ENCOUNTER — Ambulatory Visit: Payer: PRIVATE HEALTH INSURANCE | Attending: Acute Care | Admitting: Physical Therapy

## 2023-05-01 DIAGNOSIS — G8929 Other chronic pain: Secondary | ICD-10-CM | POA: Diagnosis present

## 2023-05-01 DIAGNOSIS — M25511 Pain in right shoulder: Secondary | ICD-10-CM | POA: Diagnosis present

## 2023-05-01 DIAGNOSIS — M6281 Muscle weakness (generalized): Secondary | ICD-10-CM | POA: Diagnosis present

## 2023-05-01 NOTE — Therapy (Signed)
 OUTPATIENT PHYSICAL THERAPY TREATMENT   Patient Name: Brenda Mcmahon MRN: [id number] DOB:[birth year], 33 y.o., female Today's Date: 05/01/2023   END OF SESSION:  PT End of Session - 05/01/23 1108     Visit Number 3    Number of Visits 17    Date for PT Re-Evaluation 06/14/23    Authorization Type Worker's Comp    Authorization - Visit Number 3    Authorization - Number of Visits 8    PT Start Time 1105    PT Stop Time 1145    PT Time Calculation (min) 40 min    Activity Tolerance Patient tolerated treatment well    Behavior During Therapy WFL for tasks assessed/performed               Past Medical History:  Diagnosis Date   Anxiety    Depression    Dysrhythmia    Heart murmur    Palpitation    Past Surgical History:  Procedure Laterality Date   FOOT FRACTURE SURGERY Right 2015   also ankle fracture repaired.    SHOULDER ARTHROSCOPY WITH ROTATOR CUFF REPAIR AND SUBACROMIAL DECOMPRESSION Right 03/28/2023   Procedure: SHOULDER ARTHROSCOPY  AND SUBACROMIAL DECOMPRESSION DEBRIDEMENT;  Surgeon: Duwayne Purchase, MD;  Location: [facility];  Service: Orthopedics;  Laterality: Right;   WRIST FRACTURE SURGERY Left    33 yo.  fell through glass door.    Patient Active Problem List   Diagnosis Date Noted   Chronic right shoulder pain 03/16/2023   Other insomnia 10/14/2022   Routine medical exam 09/18/2021   Need for DTaP vaccine 09/18/2021   Moderate recurrent major depression (HCC) 08/07/2021   GAD (generalized anxiety disorder) 08/07/2021   Encounter for other contraceptive management 08/07/2021   Palpitation 11/10/2020   Achilles bursitis of right lower extremity 03/14/2017   Recurrent major depression-severe (HCC) 03/14/2017    PCP: Sherre Clapper, MD  REFERRING PROVIDER: Duwayne Purchase, MD  REFERRING DIAG: Encounter for other specified surgical aftercare   THERAPY DIAG:  Chronic right shoulder pain  Muscle weakness (generalized)  Rationale for Evaluation  and Treatment: Rehabilitation  ONSET DATE: 03/28/2023   SUBJECTIVE:                           SUBJECTIVE STATEMENT: Patient reports she may have hurt her shoulder because her mom fell this morning and she grabbed and pulled on her arm. She reports that her shoulder is an 11 out of 10 today.   Hand dominance: Ambidextrous  PERTINENT HISTORY: Right shoulder arthroscopy 03/28/2023  PAIN:  Are you having pain? Yes:  NPRS scale: 11/10  Pain location: Right shoulder Pain description: Throbbing Aggravating factors: Reaching overhead or behind back Relieving factors: Heat, medication  PRECAUTIONS: None  PATIENT GOALS: Pain relief, be able to use the right arm, return to prior level of function    OBJECTIVE:  Note: Objective measures were completed at Evaluation unless otherwise noted. PATIENT SURVEYS:  FOTO 51% functional status  POSTURE: Rounded shoulder posture, she keeps right arm guarded by side  UPPER EXTREMITY ROM:   Active ROM Right eval Left eval Right 04/27/2023  Shoulder flexion 90 170 123  Shoulder extension 40 60   Shoulder abduction 70 180   Shoulder adduction     Shoulder internal rotation Sacrum T6   Shoulder external rotation 65 / occiput 85 / T4   Elbow flexion     Elbow extension     Wrist flexion  Wrist extension     Wrist ulnar deviation     Wrist radial deviation     Wrist pronation     Wrist supination     (Blank rows = not tested)  Passive ROM Right eval Left eval  Shoulder flexion 150   Shoulder extension    Shoulder abduction 120   Shoulder adduction    Shoulder internal rotation 50 @ 90   Shoulder external rotation 70 @ 0 and 90   Elbow flexion    Elbow extension    Wrist flexion    Wrist extension    Wrist ulnar deviation    Wrist radial deviation    Wrist pronation    Wrist supination    (Blank rows = not tested)  UPPER EXTREMITY MMT:  MMT Right eval Left eval  Shoulder flexion    Shoulder extension     Shoulder abduction    Shoulder adduction    Shoulder internal rotation    Shoulder external rotation    Middle trapezius    Lower trapezius    Elbow flexion    Elbow extension    Wrist flexion    Wrist extension    Wrist ulnar deviation    Wrist radial deviation    Wrist pronation    Wrist supination    Grip strength (lbs)    (Blank rows = not tested)  Strength not assessed at eval  JOINT MOBILITY TESTING:  Hypomobility of right GHJ  PALPATION:  Generalized tenderness of right shoulder                                                                                                                             TREATMENT DATE: [facility] Adult PT Treatment:                                                DATE: 05/01/2023 Therapeutic Exercise: Supine shoulder ER/IR AROM x 10 Child's pose stretch for shoulder flexion 10 x 5 seconds Supine shoulder flexion holding pilates ring x 10 Manual: Right GHJ grade I mobs A/P and distractive for pain relief Passive posterior glide/retraction   [facility] Adult PT Treatment:                                                DATE: 04/27/2023 Therapeutic Exercise: Step back stretch at counter 5 x 5 sec Supine dowel press to overhead reach 5 x 5 sec Standing wall slide 2 x 10 Sidelying shoulder ER with 1# 2 x 10 Row single arm with blue x 10 Manual: Right GHJ mobs primarily inferior and posterior at various ranges of elevation Right shoulder PROM Sidelying right scapulothoracic mobs  [facility] Adult  PT Treatment:                                                DATE: 04/19/2023 Therapeutic Exercise: Supine dowel press to overhead reach 5 x 5 sec Supine dowel ER 5 x 5 sec Sidelying shoulder ER x 10 Seated table slide shoulder flexion 5 x 5 sec Seated shoulder blade squeezes  PATIENT EDUCATION: Education details: HEP Person educated: Patient Education method: Programmer, Multimedia, Facilities Manager, Actor cues, Verbal cues Education comprehension: verbalized  understanding, returned demonstration, verbal cues required, tactile cues required, and needs further education  HOME EXERCISE PROGRAM: Access Code: [email or web address]   ASSESSMENT: CLINICAL IMPRESSION: Patient with poor tolerance for therapy due to pain, no adverse effects reports. Patient arrives reporting increased pain due to her mother pulling on her arm to attempt to correct a fall. Therapy focused on gentle manual and light stretching, and she was able to complete her exercises and demonstrate preserved motion of the right shoulder. No changes were made to her HEP this visit. Patient would benefit from continued skilled PT to progress her shoulder mobility and strength in order to reduce pain and maximize functional ability.   EVAL: Patient is a 33 y.o. female who was seen today for physical therapy evaluation and treatment for right shoulder pain following arthroscopy on 03/28/2023. Currently she demonstrates limitations with her right shoulder motion, strength not assessed this visit due to pain. She was provided exercises to initiate shoulder mobility and light rotator cuff work with good tolerance.   OBJECTIVE IMPAIRMENTS: decreased activity tolerance, decreased ROM, decreased strength, impaired UE functional use, postural dysfunction, and pain.   ACTIVITY LIMITATIONS: carrying, lifting, sleeping, bathing, dressing, reach over head, and hygiene/grooming  PARTICIPATION LIMITATIONS: meal prep, cleaning, laundry, shopping, community activity, and occupation  PERSONAL FACTORS: Past/current experiences and Time since onset of injury/illness/exacerbation are also affecting patient's functional outcome.    GOALS: Goals reviewed with patient? Yes  SHORT TERM GOALS: Target date: 05/17/2023  Patient will be I with initial HEP in order to progress with therapy. Baseline: HEP provided at eval Goal status: INITIAL  2.  Patient will report right shoulder pain </= 4/10 in order to reduce functional  limitations Baseline: 7/10 Goal status: INITIAL  3.  Patient will demonstrate right shoulder active elevation >/= 120 deg in order to improve self care ability Baseline: 90 deg Goal status: INITIAL  LONG TERM GOALS: Target date: 06/14/2023  Patient will be I with final HEP to maintain progress from PT.  Baseline: HEP provided at eval Goal status: INITIAL  2.  Patient will report >/= 65% status on FOTO to indicate improved functional ability. Baseline: 51% functional status Goal status: INITIAL  3.  Patient will demonstrate right shoulder active elevation >/= 160 deg to normalize overhead reach  Baseline: 90 deg Goal status: INITIAL  4.  Patient will be able to reach behind back to T10 in order to improve dressing and bathing ability Baseline: Sacrum Goal status: INITIAL  5. Patient will demonstrate right rotator cuff strength 5/5 MMT in order to improve lifting and carrying with the right arm  Basline: not assessed at eval  Goal status: INITIAL   PLAN: PT FREQUENCY: 1-2x/week  PT DURATION: 8 weeks  PLANNED INTERVENTIONS: 97164- PT Re-evaluation, 97110-Therapeutic exercises, 97530- Therapeutic activity, [id number]- Neuromuscular re-education, 97535- Self Care, [id number]- Manual therapy, [id number]- Aquatic Therapy, 97014-  Electrical stimulation (unattended), [id number]- Electrical stimulation (manual), 02983- Vasopneumatic device, [id number]- Ionotophoresis 4mg /ml Dexamethasone , Patient/Family education, Balance training, Taping, Dry Needling, Joint mobilization, Joint manipulation, Spinal manipulation, Spinal mobilization, Cryotherapy, and Moist heat  PLAN FOR NEXT SESSION: Review HEP and progress PRN, manual/mobs/PROM for right shoulder motion, progress AAROM>AROM as tolerated, progress right shoulder strength and postural control as tolerated   Elaine Daring, PT, DPT, LAT, ATC 05/01/23  12:57 PM Phone: [phone] Fax: [phone]

## 2023-05-02 NOTE — Therapy (Signed)
OUTPATIENT PHYSICAL THERAPY TREATMENT   Patient Name: Brenda Mcmahon MRN: [id number] DOB:Aug 13, 1990, 33 y.o., female Today's Date: 05/03/2023   END OF SESSION:  PT End of Session - 05/03/23 1108     Visit Number 4    Number of Visits 17    Date for PT Re-Evaluation 06/14/23    Authorization Type Worker's Comp    Authorization - Visit Number 4    Authorization - Number of Visits 8    PT Start Time 1105    PT Stop Time 1145    PT Time Calculation (min) 40 min    Activity Tolerance Patient tolerated treatment well    Behavior During Therapy WFL for tasks assessed/performed                Past Medical History:  Diagnosis Date   Anxiety    Depression    Dysrhythmia    Heart murmur    Palpitation    Past Surgical History:  Procedure Laterality Date   FOOT FRACTURE SURGERY Right 2015   also ankle fracture repaired.    SHOULDER ARTHROSCOPY WITH ROTATOR CUFF REPAIR AND SUBACROMIAL DECOMPRESSION Right 03/28/2023   Procedure: SHOULDER ARTHROSCOPY  AND SUBACROMIAL DECOMPRESSION DEBRIDEMENT;  Surgeon: Jene Every, MD;  Location: [facility];  Service: Orthopedics;  Laterality: Right;   WRIST FRACTURE SURGERY Left    33 yo.  fell through glass door.    Patient Active Problem List   Diagnosis Date Noted   Chronic right shoulder pain 03/16/2023   Other insomnia 10/14/2022   Routine medical exam 09/18/2021   Need for DTaP vaccine 09/18/2021   Moderate recurrent major depression (HCC) 08/07/2021   GAD (generalized anxiety disorder) 08/07/2021   Encounter for other contraceptive management 08/07/2021   Palpitation 11/10/2020   Achilles bursitis of right lower extremity 03/14/2017   Recurrent major depression-severe (HCC) 03/14/2017    PCP: Blane Ohara, MD  REFERRING PROVIDER: Jene Every, MD  REFERRING DIAG: Encounter for other specified surgical aftercare   THERAPY DIAG:  Chronic right shoulder pain  Muscle weakness (generalized)  Rationale for Evaluation  and Treatment: Rehabilitation  ONSET DATE: 03/28/2023   SUBJECTIVE:                           SUBJECTIVE STATEMENT: Patient reports she is better today. She states she is not having pain but the shoulder is feeling tight.   Hand dominance: Ambidextrous  PERTINENT HISTORY: Right shoulder arthroscopy 03/28/2023  PAIN:  Are you having pain? Yes:  NPRS scale: - /10  Pain location: Right shoulder Pain description: Tight Aggravating factors: Reaching overhead or behind back Relieving factors: Heat, medication  PRECAUTIONS: None  PATIENT GOALS: Pain relief, be able to use the right arm, return to prior level of function    OBJECTIVE:  Note: Objective measures were completed at Evaluation unless otherwise noted. PATIENT SURVEYS:  FOTO 51% functional status  POSTURE: Rounded shoulder posture, she keeps right arm guarded by side  UPPER EXTREMITY ROM:   Active ROM Right eval Left eval Right 04/27/2023  Shoulder flexion 90 170 123  Shoulder extension 40 60   Shoulder abduction 70 180   Shoulder adduction     Shoulder internal rotation Sacrum T6   Shoulder external rotation 65 / occiput 85 / T4   Elbow flexion     Elbow extension     Wrist flexion     Wrist extension     Wrist ulnar deviation  Wrist radial deviation     Wrist pronation     Wrist supination     (Blank rows = not tested)  Passive ROM Right eval Left eval  Shoulder flexion 150   Shoulder extension    Shoulder abduction 120   Shoulder adduction    Shoulder internal rotation 50 @ 90   Shoulder external rotation 70 @ 0 and 90   Elbow flexion    Elbow extension    Wrist flexion    Wrist extension    Wrist ulnar deviation    Wrist radial deviation    Wrist pronation    Wrist supination    (Blank rows = not tested)  UPPER EXTREMITY MMT:  MMT Right eval Left eval  Shoulder flexion    Shoulder extension    Shoulder abduction    Shoulder adduction    Shoulder internal rotation     Shoulder external rotation    Middle trapezius    Lower trapezius    Elbow flexion    Elbow extension    Wrist flexion    Wrist extension    Wrist ulnar deviation    Wrist radial deviation    Wrist pronation    Wrist supination    Grip strength (lbs)    (Blank rows = not tested)  Strength not assessed at eval  JOINT MOBILITY TESTING:  Hypomobility of right GHJ  PALPATION:  Generalized tenderness of right shoulder                                                                                                                             TREATMENT DATE: [facility] Adult PT Treatment:                                                DATE: 05/03/2023 Therapeutic Exercise: Supine shoulder flexion 2 x 10 Sidelying shoulder flexion x 10 Sidelying abduction x 10 Sidelying ER x 10 Manual: STM right middle and anterior deltoid Right GHJ mobs primarily inferior and posterior at various ranges of elevation Right shoulder PROM   [facility] Adult PT Treatment:                                                DATE: 05/01/2023 Therapeutic Exercise: Supine shoulder ER/IR AROM x 10 Child's pose stretch for shoulder flexion 10 x 5 seconds Supine shoulder flexion holding pilates ring x 10 Manual: Right GHJ grade I mobs A/P and distractive for pain relief Passive posterior glide/retraction  [facility] Adult PT Treatment:  DATE: 04/27/2023 Therapeutic Exercise: Step back stretch at counter 5 x 5 sec Supine dowel press to overhead reach 5 x 5 sec Standing wall slide 2 x 10 Sidelying shoulder ER with 1# 2 x 10 Row single arm with blue x 10 Manual: Right GHJ mobs primarily inferior and posterior at various ranges of elevation Right shoulder PROM Sidelying right scapulothoracic mobs  [facility] Adult PT Treatment:                                                DATE: 04/19/2023 Therapeutic Exercise: Supine dowel press to overhead reach 5 x 5 sec Supine dowel ER 5 x 5  sec Sidelying shoulder ER x 10 Seated table slide shoulder flexion 5 x 5 sec Seated shoulder blade squeezes  PATIENT EDUCATION: Education details: HEP Person educated: Patient Education method: Programmer, multimedia, Facilities manager, Actor cues, Verbal cues Education comprehension: verbalized understanding, returned demonstration, verbal cues required, tactile cues required, and needs further education  HOME EXERCISE PROGRAM: Access Code: [email or web address]   ASSESSMENT: CLINICAL IMPRESSION: Patient with better tolerance for therapy this visit and no adverse effects reported. She exhibits increased muscle tension and tenderness of the right deltoid so performing STM and continued with shoulder mobility and stretching exercises. Worked on shoulder AROM in gravity reduced positions this visit as she continues to have poor tolerance for shoulder strengthening exercises since aggravating her shoulder before last visit. No changes made to her HEP.  Patient would benefit from continued skilled PT to progress her shoulder mobility and strength in order to reduce pain and maximize functional ability.   EVAL: Patient is a 33 y.o. female who was seen today for physical therapy evaluation and treatment for right shoulder pain following arthroscopy on 03/28/2023. Currently she demonstrates limitations with her right shoulder motion, strength not assessed this visit due to pain. She was provided exercises to initiate shoulder mobility and light rotator cuff work with good tolerance.   OBJECTIVE IMPAIRMENTS: decreased activity tolerance, decreased ROM, decreased strength, impaired UE functional use, postural dysfunction, and pain.   ACTIVITY LIMITATIONS: carrying, lifting, sleeping, bathing, dressing, reach over head, and hygiene/grooming  PARTICIPATION LIMITATIONS: meal prep, cleaning, laundry, shopping, community activity, and occupation  PERSONAL FACTORS: Past/current experiences and Time since onset of  injury/illness/exacerbation are also affecting patient's functional outcome.    GOALS: Goals reviewed with patient? Yes  SHORT TERM GOALS: Target date: 05/17/2023  Patient will be I with initial HEP in order to progress with therapy. Baseline: HEP provided at eval Goal status: INITIAL  2.  Patient will report right shoulder pain </= 4/10 in order to reduce functional limitations Baseline: 7/10 Goal status: INITIAL  3.  Patient will demonstrate right shoulder active elevation >/= 120 deg in order to improve self care ability Baseline: 90 deg Goal status: INITIAL  LONG TERM GOALS: Target date: 06/14/2023  Patient will be I with final HEP to maintain progress from PT.  Baseline: HEP provided at eval Goal status: INITIAL  2.  Patient will report >/= 65% status on FOTO to indicate improved functional ability. Baseline: 51% functional status Goal status: INITIAL  3.  Patient will demonstrate right shoulder active elevation >/= 160 deg to normalize overhead reach  Baseline: 90 deg Goal status: INITIAL  4.  Patient will be able to reach behind back to T10 in order to improve dressing and bathing ability  Baseline: Sacrum Goal status: INITIAL  5. Patient will demonstrate right rotator cuff strength 5/5 MMT in order to improve lifting and carrying with the right arm  Basline: not assessed at eval  Goal status: INITIAL   PLAN: PT FREQUENCY: 1-2x/week  PT DURATION: 8 weeks  PLANNED INTERVENTIONS: 97164- PT Re-evaluation, 97110-Therapeutic exercises, 97530- Therapeutic activity, 97112- Neuromuscular re-education, 97535- Self Care, 16109- Manual therapy, [id number]- Aquatic Therapy, 97014- Electrical stimulation (unattended), [id number]- Electrical stimulation (manual), 97016- Vasopneumatic device, [id number]- Ionotophoresis 4mg /ml Dexamethasone, Patient/Family education, Balance training, Taping, Dry Needling, Joint mobilization, Joint manipulation, Spinal manipulation, Spinal mobilization,  Cryotherapy, and Moist heat  PLAN FOR NEXT SESSION: Review HEP and progress PRN, manual/mobs/PROM for right shoulder motion, progress AAROM>AROM as tolerated, progress right shoulder strength and postural control as tolerated   Rosana Hoes, PT, DPT, LAT, ATC 05/03/23  1:08 PM Phone: [phone] Fax: [phone]

## 2023-05-03 ENCOUNTER — Ambulatory Visit: Payer: PRIVATE HEALTH INSURANCE | Admitting: Physical Therapy

## 2023-05-03 ENCOUNTER — Encounter: Payer: Self-pay | Admitting: Physical Therapy

## 2023-05-03 ENCOUNTER — Other Ambulatory Visit: Payer: Self-pay

## 2023-05-03 DIAGNOSIS — M25511 Pain in right shoulder: Secondary | ICD-10-CM | POA: Diagnosis not present

## 2023-05-03 DIAGNOSIS — M6281 Muscle weakness (generalized): Secondary | ICD-10-CM

## 2023-05-03 DIAGNOSIS — G8929 Other chronic pain: Secondary | ICD-10-CM

## 2023-05-07 ENCOUNTER — Encounter: Payer: Self-pay | Admitting: Physical Therapy

## 2023-05-07 ENCOUNTER — Ambulatory Visit: Payer: PRIVATE HEALTH INSURANCE | Admitting: Physical Therapy

## 2023-05-07 ENCOUNTER — Encounter: Payer: Self-pay | Admitting: Cardiology

## 2023-05-07 ENCOUNTER — Other Ambulatory Visit: Payer: Self-pay

## 2023-05-07 DIAGNOSIS — R011 Cardiac murmur, unspecified: Secondary | ICD-10-CM | POA: Insufficient documentation

## 2023-05-07 DIAGNOSIS — G8929 Other chronic pain: Secondary | ICD-10-CM

## 2023-05-07 DIAGNOSIS — F32A Depression, unspecified: Secondary | ICD-10-CM | POA: Insufficient documentation

## 2023-05-07 DIAGNOSIS — F419 Anxiety disorder, unspecified: Secondary | ICD-10-CM | POA: Insufficient documentation

## 2023-05-07 DIAGNOSIS — M25511 Pain in right shoulder: Secondary | ICD-10-CM | POA: Diagnosis not present

## 2023-05-07 DIAGNOSIS — I499 Cardiac arrhythmia, unspecified: Secondary | ICD-10-CM | POA: Insufficient documentation

## 2023-05-07 DIAGNOSIS — F4312 Post-traumatic stress disorder, chronic: Secondary | ICD-10-CM | POA: Diagnosis not present

## 2023-05-07 DIAGNOSIS — F332 Major depressive disorder, recurrent severe without psychotic features: Secondary | ICD-10-CM | POA: Diagnosis not present

## 2023-05-07 DIAGNOSIS — M6281 Muscle weakness (generalized): Secondary | ICD-10-CM

## 2023-05-07 NOTE — Therapy (Signed)
OUTPATIENT PHYSICAL THERAPY TREATMENT   Patient Name: Brenda Mcmahon MRN: [id number] DOB:11-17-1990, 33 y.o., female Today's Date: 05/08/2023   END OF SESSION:  PT End of Session - 05/08/23 1126     Visit Number 6    Number of Visits 17    Date for PT Re-Evaluation 06/14/23    Authorization Type Worker's Comp    Authorization - Visit Number 6    Authorization - Number of Visits 8    PT Start Time 1100    PT Stop Time 1145    PT Time Calculation (min) 45 min    Activity Tolerance Patient tolerated treatment well    Behavior During Therapy WFL for tasks assessed/performed                  Past Medical History:  Diagnosis Date   Achilles bursitis of right lower extremity 03/14/2017   Anxiety    Chronic right shoulder pain 03/16/2023   Depression    Dysrhythmia    Encounter for other contraceptive management 08/07/2021   GAD (generalized anxiety disorder) 08/07/2021   Heart murmur    Moderate recurrent major depression (HCC) 08/07/2021   Need for DTaP vaccine 09/18/2021   Other insomnia 10/14/2022   Palpitation    Post-traumatic stress disorder, chronic 02/22/2023   Recurrent major depression-severe (HCC) 03/14/2017   Routine medical exam 09/18/2021   Past Surgical History:  Procedure Laterality Date   FOOT FRACTURE SURGERY Right 2015   also ankle fracture repaired.    SHOULDER ARTHROSCOPY WITH ROTATOR CUFF REPAIR AND SUBACROMIAL DECOMPRESSION Right 03/28/2023   Procedure: SHOULDER ARTHROSCOPY  AND SUBACROMIAL DECOMPRESSION DEBRIDEMENT;  Surgeon: Jene Every, MD;  Location: [facility];  Service: Orthopedics;  Laterality: Right;   WRIST FRACTURE SURGERY Left    33 yo.  fell through glass door.    Patient Active Problem List   Diagnosis Date Noted   Anxiety    Depression    Dysrhythmia    Heart murmur    Chronic right shoulder pain 03/16/2023   Post-traumatic stress disorder, chronic 02/22/2023   Other insomnia 10/14/2022   Routine medical exam  09/18/2021   Need for DTaP vaccine 09/18/2021   Moderate recurrent major depression (HCC) 08/07/2021   GAD (generalized anxiety disorder) 08/07/2021   Encounter for other contraceptive management 08/07/2021   Palpitation 11/10/2020   Achilles bursitis of right lower extremity 03/14/2017   Recurrent major depression-severe (HCC) 03/14/2017    PCP: Blane Ohara, MD  REFERRING PROVIDER: Jene Every, MD  REFERRING DIAG: Encounter for other specified surgical aftercare   THERAPY DIAG:  Chronic right shoulder pain  Muscle weakness (generalized)  Rationale for Evaluation and Treatment: Rehabilitation  ONSET DATE: 03/28/2023   SUBJECTIVE:                           SUBJECTIVE STATEMENT: Patient reports the needling helped. She feels she can get dressed better but still does have tightness of the right shoulder.   Hand dominance: Ambidextrous  PERTINENT HISTORY: Right shoulder arthroscopy 03/28/2023  PAIN:  Are you having pain? Yes:  NPRS scale: - /10  Pain location: Right shoulder Pain description: Tight Aggravating factors: Reaching overhead or behind back Relieving factors: Heat, medication  PRECAUTIONS: None  PATIENT GOALS: Pain relief, be able to use the right arm, return to prior level of function    OBJECTIVE:  Note: Objective measures were completed at Evaluation unless otherwise noted. PATIENT SURVEYS:  FOTO  51% functional status  POSTURE: Rounded shoulder posture, she keeps right arm guarded by side  UPPER EXTREMITY ROM:   Active ROM Right eval Left eval Right 04/27/2023 Right 05/08/2023  Shoulder flexion 90 170 123 140  Shoulder extension 40 60    Shoulder abduction 70 180  135  Shoulder adduction      Shoulder internal rotation Sacrum T6  L5  Shoulder external rotation 65 / occiput 85 / T4    Elbow flexion      Elbow extension      Wrist flexion      Wrist extension      Wrist ulnar deviation      Wrist radial deviation      Wrist  pronation      Wrist supination      (Blank rows = not tested)  Passive ROM Right eval Left eval  Shoulder flexion 150   Shoulder extension    Shoulder abduction 120   Shoulder adduction    Shoulder internal rotation 50 @ 90   Shoulder external rotation 70 @ 0 and 90   Elbow flexion    Elbow extension    Wrist flexion    Wrist extension    Wrist ulnar deviation    Wrist radial deviation    Wrist pronation    Wrist supination    (Blank rows = not tested)  UPPER EXTREMITY MMT:  MMT Right eval Left eval  Shoulder flexion    Shoulder extension    Shoulder abduction    Shoulder adduction    Shoulder internal rotation    Shoulder external rotation    Middle trapezius    Lower trapezius    Elbow flexion    Elbow extension    Wrist flexion    Wrist extension    Wrist ulnar deviation    Wrist radial deviation    Wrist pronation    Wrist supination    Grip strength (lbs)    (Blank rows = not tested)  Strength not assessed at eval  JOINT MOBILITY TESTING:  Hypomobility of right GHJ  PALPATION:  Generalized tenderness of right shoulder                                                                                                                             TREATMENT DATE: [facility] Adult PT Treatment:                                                DATE: 05/08/2023 Therapeutic Exercise: Incline (45 deg) shoulder flexion with 1# x 20 Supine serratus punch with 1# x 20 Prone row with 1# x 20 Prone shoulder extension with 1# x 20 Prone horizontal abduction x 20 Manual: STM right deltoid and posterior cuff region Right GHJ mobs primarily inferior and posterior at various ranges of  elevation Right shoulder PROM Prone thoracic thrust manipulation   [facility] Adult PT Treatment:                                                DATE: 05/07/2023 Therapeutic Exercise: UBE L1 x 4 min (fwd/bwd) while taking subjective Supine shoulder flexion with 1# 2 x 15 Supine serratus punch  with 1# 2 x 15 Supine shoulder flexion > 90 with yellow band 2 x 10 Sidelying ER with 1# 2 x 15 Manual: Skilled palpation and monitoring of muscle tension while performing TPDN STM right middle and anterior deltoid Right GHJ mobs primarily inferior and posterior at various ranges of elevation Right shoulder PROM  Trigger Point Dry Needling  Initial Treatment: Pt instructed on Dry Needling rational, procedures, and possible side effects. Pt instructed to expect mild to moderate muscle soreness later in the day and/or into the next day.  Pt instructed in methods to reduce muscle soreness. Pt instructed to continue prescribed HEP. Because Dry Needling was performed over or adjacent to a lung field, pt was educated on S/S of pneumothorax and to seek immediate medical attention should they occur.  Patient was educated on signs and symptoms of infection and other risk factors and advised to seek medical attention should they occur.  Patient verbalized understanding of these instructions and education.   Patient Verbal Consent Given: Yes Education Handout Provided: Previously Provided Muscles Treated: Right anterior and middle deltoid Electrical Stimulation Performed: Yes, Parameters: Milli, intensity and frequency adjusted throughout treatment for patient tolerance Treatment Response/Outcome: Twitch response, patient report improvement in symptoms  [facility] Adult PT Treatment:                                                DATE: 05/03/2023 Therapeutic Exercise: Supine shoulder flexion 2 x 10 Sidelying shoulder flexion x 10 Sidelying abduction x 10 Sidelying ER x 10 Manual: STM right middle and anterior deltoid Right GHJ mobs primarily inferior and posterior at various ranges of elevation Right shoulder PROM  [facility] Adult PT Treatment:                                                DATE: 05/01/2023 Therapeutic Exercise: Supine shoulder ER/IR AROM x 10 Child's pose stretch for shoulder flexion  10 x 5 seconds Supine shoulder flexion holding pilates ring x 10 Manual: Right GHJ grade I mobs A/P and distractive for pain relief Passive posterior glide/retraction  PATIENT EDUCATION: Education details: HEP Person educated: Patient Education method: Programmer, multimedia, Facilities manager, Actor cues, Verbal cues Education comprehension: verbalized understanding, returned demonstration, verbal cues required, tactile cues required, and needs further education  HOME EXERCISE PROGRAM: Access Code: [email or web address]   ASSESSMENT: CLINICAL IMPRESSION: Patient tolerated therapy well with no adverse effects. She demonstrates improvement in her shoulder motion this visit and is progressing nicely with her rotator cuff and periscapular strengthening and control exercises. She was able to progress to inclined shoulder elevation with light weight with good technique and no report of increased pain, and progressed prone periscapular strengthening with cueing required for proper scapular retraction/depression to avoid  anterior humeral translation. No changes made to her HEP this visit. Patient would benefit from continued skilled PT to progress her shoulder mobility and strength in order to reduce pain and maximize functional ability.   EVAL: Patient is a 33 y.o. female who was seen today for physical therapy evaluation and treatment for right shoulder pain following arthroscopy on 03/28/2023. Currently she demonstrates limitations with her right shoulder motion, strength not assessed this visit due to pain. She was provided exercises to initiate shoulder mobility and light rotator cuff work with good tolerance.   OBJECTIVE IMPAIRMENTS: decreased activity tolerance, decreased ROM, decreased strength, impaired UE functional use, postural dysfunction, and pain.   ACTIVITY LIMITATIONS: carrying, lifting, sleeping, bathing, dressing, reach over head, and hygiene/grooming  PARTICIPATION LIMITATIONS: meal prep, cleaning,  laundry, shopping, community activity, and occupation  PERSONAL FACTORS: Past/current experiences and Time since onset of injury/illness/exacerbation are also affecting patient's functional outcome.    GOALS: Goals reviewed with patient? Yes  SHORT TERM GOALS: Target date: 05/17/2023  Patient will be I with initial HEP in order to progress with therapy. Baseline: HEP provided at eval 05/08/2023: independent with her initial HEP Goal status: MET  2.  Patient will report right shoulder pain </= 4/10 in order to reduce functional limitations Baseline: 7/10 05/08/2023: denies pain but reports tightness Goal status: MET  3.  Patient will demonstrate right shoulder active elevation >/= 120 deg in order to improve self care ability Baseline: 90 deg 05/08/2023: 140 deg Goal status: MET  LONG TERM GOALS: Target date: 06/14/2023  Patient will be I with final HEP to maintain progress from PT.  Baseline: HEP provided at eval Goal status: INITIAL  2.  Patient will report >/= 65% status on FOTO to indicate improved functional ability. Baseline: 51% functional status Goal status: INITIAL  3.  Patient will demonstrate right shoulder active elevation >/= 160 deg to normalize overhead reach  Baseline: 90 deg Goal status: INITIAL  4.  Patient will be able to reach behind back to T10 in order to improve dressing and bathing ability Baseline: Sacrum Goal status: INITIAL  5. Patient will demonstrate right rotator cuff strength 5/5 MMT in order to improve lifting and carrying with the right arm  Basline: not assessed at eval  Goal status: INITIAL   PLAN: PT FREQUENCY: 1-2x/week  PT DURATION: 8 weeks  PLANNED INTERVENTIONS: 97164- PT Re-evaluation, 97110-Therapeutic exercises, 97530- Therapeutic activity, 97112- Neuromuscular re-education, 97535- Self Care, 08657- Manual therapy, [id number]- Aquatic Therapy, 97014- Electrical stimulation (unattended), [id number]- Electrical stimulation (manual), 97016-  Vasopneumatic device, 97033- Ionotophoresis 4mg /ml Dexamethasone, Patient/Family education, Balance training, Taping, Dry Needling, Joint mobilization, Joint manipulation, Spinal manipulation, Spinal mobilization, Cryotherapy, and Moist heat  PLAN FOR NEXT SESSION: Review HEP and progress PRN, manual/mobs/PROM for right shoulder motion, progress AAROM>AROM as tolerated, progress right shoulder strength and postural control as tolerated   Rosana Hoes, PT, DPT, LAT, ATC 05/08/23  12:17 PM Phone: [phone] Fax: [phone]

## 2023-05-07 NOTE — Therapy (Signed)
OUTPATIENT PHYSICAL THERAPY TREATMENT   Patient Name: Brenda Mcmahon MRN: [id number] DOB:Dec 01, 1990, 33 y.o., female Today's Date: 05/07/2023   END OF SESSION:  PT End of Session - 05/07/23 1054     Visit Number 5    Number of Visits 17    Date for PT Re-Evaluation 06/14/23    Authorization Type Worker's Comp    Authorization - Visit Number 5    Authorization - Number of Visits 8    PT Start Time 1100    PT Stop Time 1145    PT Time Calculation (min) 45 min    Activity Tolerance Patient tolerated treatment well    Behavior During Therapy WFL for tasks assessed/performed                 Past Medical History:  Diagnosis Date   Anxiety    Depression    Dysrhythmia    Heart murmur    Palpitation    Past Surgical History:  Procedure Laterality Date   FOOT FRACTURE SURGERY Right 2015   also ankle fracture repaired.    SHOULDER ARTHROSCOPY WITH ROTATOR CUFF REPAIR AND SUBACROMIAL DECOMPRESSION Right 03/28/2023   Procedure: SHOULDER ARTHROSCOPY  AND SUBACROMIAL DECOMPRESSION DEBRIDEMENT;  Surgeon: Jene Every, MD;  Location: [facility];  Service: Orthopedics;  Laterality: Right;   WRIST FRACTURE SURGERY Left    33 yo.  fell through glass door.    Patient Active Problem List   Diagnosis Date Noted   Chronic right shoulder pain 03/16/2023   Other insomnia 10/14/2022   Routine medical exam 09/18/2021   Need for DTaP vaccine 09/18/2021   Moderate recurrent major depression (HCC) 08/07/2021   GAD (generalized anxiety disorder) 08/07/2021   Encounter for other contraceptive management 08/07/2021   Palpitation 11/10/2020   Achilles bursitis of right lower extremity 03/14/2017   Recurrent major depression-severe (HCC) 03/14/2017    PCP: Blane Ohara, MD  REFERRING PROVIDER: Jene Every, MD  REFERRING DIAG: Encounter for other specified surgical aftercare   THERAPY DIAG:  Chronic right shoulder pain  Muscle weakness (generalized)  Rationale for  Evaluation and Treatment: Rehabilitation  ONSET DATE: 03/28/2023   SUBJECTIVE:                           SUBJECTIVE STATEMENT: Patient reports she is ok, but the front of her shoulder is still really really tight. Reports the shoulder just feels uncomfortable, not really painful.  Hand dominance: Ambidextrous  PERTINENT HISTORY: Right shoulder arthroscopy 03/28/2023  PAIN:  Are you having pain? Yes:  NPRS scale: - /10  Pain location: Right shoulder Pain description: Tight Aggravating factors: Reaching overhead or behind back Relieving factors: Heat, medication  PRECAUTIONS: None  PATIENT GOALS: Pain relief, be able to use the right arm, return to prior level of function    OBJECTIVE:  Note: Objective measures were completed at Evaluation unless otherwise noted. PATIENT SURVEYS:  FOTO 51% functional status  POSTURE: Rounded shoulder posture, she keeps right arm guarded by side  UPPER EXTREMITY ROM:   Active ROM Right eval Left eval Right 04/27/2023  Shoulder flexion 90 170 123  Shoulder extension 40 60   Shoulder abduction 70 180   Shoulder adduction     Shoulder internal rotation Sacrum T6   Shoulder external rotation 65 / occiput 85 / T4   Elbow flexion     Elbow extension     Wrist flexion     Wrist extension  Wrist ulnar deviation     Wrist radial deviation     Wrist pronation     Wrist supination     (Blank rows = not tested)  Passive ROM Right eval Left eval  Shoulder flexion 150   Shoulder extension    Shoulder abduction 120   Shoulder adduction    Shoulder internal rotation 50 @ 90   Shoulder external rotation 70 @ 0 and 90   Elbow flexion    Elbow extension    Wrist flexion    Wrist extension    Wrist ulnar deviation    Wrist radial deviation    Wrist pronation    Wrist supination    (Blank rows = not tested)  UPPER EXTREMITY MMT:  MMT Right eval Left eval  Shoulder flexion    Shoulder extension    Shoulder abduction     Shoulder adduction    Shoulder internal rotation    Shoulder external rotation    Middle trapezius    Lower trapezius    Elbow flexion    Elbow extension    Wrist flexion    Wrist extension    Wrist ulnar deviation    Wrist radial deviation    Wrist pronation    Wrist supination    Grip strength (lbs)    (Blank rows = not tested)  Strength not assessed at eval  JOINT MOBILITY TESTING:  Hypomobility of right GHJ  PALPATION:  Generalized tenderness of right shoulder                                                                                                                             TREATMENT DATE: [facility] Adult PT Treatment:                                                DATE: 05/07/2023 Therapeutic Exercise: UBE L1 x 4 min (fwd/bwd) while taking subjective Supine shoulder flexion with 1# 2 x 15 Supine serratus punch with 1# 2 x 15 Supine shoulder flexion > 90 with yellow band 2 x 10 Sidelying ER with 1# 2 x 15 Manual: Skilled palpation and monitoring of muscle tension while performing TPDN STM right middle and anterior deltoid Right GHJ mobs primarily inferior and posterior at various ranges of elevation Right shoulder PROM  Trigger Point Dry Needling  Initial Treatment: Pt instructed on Dry Needling rational, procedures, and possible side effects. Pt instructed to expect mild to moderate muscle soreness later in the day and/or into the next day.  Pt instructed in methods to reduce muscle soreness. Pt instructed to continue prescribed HEP. Because Dry Needling was performed over or adjacent to a lung field, pt was educated on S/S of pneumothorax and to seek immediate medical attention should they occur.  Patient was educated on signs and symptoms of infection  and other risk factors and advised to seek medical attention should they occur.  Patient verbalized understanding of these instructions and education.   Patient Verbal Consent Given: Yes Education Handout  Provided: Previously Provided Muscles Treated: Right anterior and middle deltoid Electrical Stimulation Performed: Yes, Parameters: Milli, intensity and frequency adjusted throughout treatment for patient tolerance Treatment Response/Outcome: Twitch response, patient report improvement in symptoms   [facility] Adult PT Treatment:                                                DATE: 05/03/2023 Therapeutic Exercise: Supine shoulder flexion 2 x 10 Sidelying shoulder flexion x 10 Sidelying abduction x 10 Sidelying ER x 10 Manual: STM right middle and anterior deltoid Right GHJ mobs primarily inferior and posterior at various ranges of elevation Right shoulder PROM  [facility] Adult PT Treatment:                                                DATE: 05/01/2023 Therapeutic Exercise: Supine shoulder ER/IR AROM x 10 Child's pose stretch for shoulder flexion 10 x 5 seconds Supine shoulder flexion holding pilates ring x 10 Manual: Right GHJ grade I mobs A/P and distractive for pain relief Passive posterior glide/retraction  [facility] Adult PT Treatment:                                                DATE: 04/27/2023 Therapeutic Exercise: Step back stretch at counter 5 x 5 sec Supine dowel press to overhead reach 5 x 5 sec Standing wall slide 2 x 10 Sidelying shoulder ER with 1# 2 x 10 Row single arm with blue x 10 Manual: Right GHJ mobs primarily inferior and posterior at various ranges of elevation Right shoulder PROM Sidelying right scapulothoracic mobs  PATIENT EDUCATION: Education details: HEP Person educated: Patient Education method: Programmer, multimedia, Demonstration, Actor cues, Verbal cues Education comprehension: verbalized understanding, returned demonstration, verbal cues required, tactile cues required, and needs further education  HOME EXERCISE PROGRAM: Access Code: [email or web address]   ASSESSMENT: CLINICAL IMPRESSION: Patient tolerated therapy well with no adverse effects. Therapy focused on  improving right shoulder mobility and light strengthening. She continues to report tightness primarily anterior and lateral right shoulder so performed TPDN for the right deltoid combined with e-stim attended with good therapeutic benefit. She does exhibit good PROM but lacks motion primarily with IR and does report tightness at end range elevation. She was able to tolerate some light rotator cuff and periscapular strengthening without increase in pain. No changes made to HEP this visit. Patient would benefit from continued skilled PT to progress her shoulder mobility and strength in order to reduce pain and maximize functional ability.   EVAL: Patient is a 33 y.o. female who was seen today for physical therapy evaluation and treatment for right shoulder pain following arthroscopy on 03/28/2023. Currently she demonstrates limitations with her right shoulder motion, strength not assessed this visit due to pain. She was provided exercises to initiate shoulder mobility and light rotator cuff work with good tolerance.   OBJECTIVE IMPAIRMENTS: decreased activity tolerance, decreased ROM, decreased  strength, impaired UE functional use, postural dysfunction, and pain.   ACTIVITY LIMITATIONS: carrying, lifting, sleeping, bathing, dressing, reach over head, and hygiene/grooming  PARTICIPATION LIMITATIONS: meal prep, cleaning, laundry, shopping, community activity, and occupation  PERSONAL FACTORS: Past/current experiences and Time since onset of injury/illness/exacerbation are also affecting patient's functional outcome.    GOALS: Goals reviewed with patient? Yes  SHORT TERM GOALS: Target date: 05/17/2023  Patient will be I with initial HEP in order to progress with therapy. Baseline: HEP provided at eval Goal status: INITIAL  2.  Patient will report right shoulder pain </= 4/10 in order to reduce functional limitations Baseline: 7/10 Goal status: INITIAL  3.  Patient will demonstrate right shoulder  active elevation >/= 120 deg in order to improve self care ability Baseline: 90 deg Goal status: INITIAL  LONG TERM GOALS: Target date: 06/14/2023  Patient will be I with final HEP to maintain progress from PT.  Baseline: HEP provided at eval Goal status: INITIAL  2.  Patient will report >/= 65% status on FOTO to indicate improved functional ability. Baseline: 51% functional status Goal status: INITIAL  3.  Patient will demonstrate right shoulder active elevation >/= 160 deg to normalize overhead reach  Baseline: 90 deg Goal status: INITIAL  4.  Patient will be able to reach behind back to T10 in order to improve dressing and bathing ability Baseline: Sacrum Goal status: INITIAL  5. Patient will demonstrate right rotator cuff strength 5/5 MMT in order to improve lifting and carrying with the right arm  Basline: not assessed at eval  Goal status: INITIAL   PLAN: PT FREQUENCY: 1-2x/week  PT DURATION: 8 weeks  PLANNED INTERVENTIONS: 97164- PT Re-evaluation, 97110-Therapeutic exercises, 97530- Therapeutic activity, 97112- Neuromuscular re-education, 97535- Self Care, 40981- Manual therapy, [id number]- Aquatic Therapy, 97014- Electrical stimulation (unattended), [id number]- Electrical stimulation (manual), 97016- Vasopneumatic device, 97033- Ionotophoresis 4mg /ml Dexamethasone, Patient/Family education, Balance training, Taping, Dry Needling, Joint mobilization, Joint manipulation, Spinal manipulation, Spinal mobilization, Cryotherapy, and Moist heat  PLAN FOR NEXT SESSION: Review HEP and progress PRN, manual/mobs/PROM for right shoulder motion, progress AAROM>AROM as tolerated, progress right shoulder strength and postural control as tolerated   Rosana Hoes, PT, DPT, LAT, ATC 05/07/23  12:10 PM Phone: [phone] Fax: [phone]

## 2023-05-07 NOTE — Progress Notes (Unsigned)
Cardiology Office Note:    Date:  05/07/2023   ID:  Brenda Mcmahon, DOB 1990/09/11, MRN [id number]  PCP:  Blane Ohara, MD  Cardiologist:  Norman Herrlich, MD    Referring MD: Blane Ohara, MD    ASSESSMENT:    1. Anxiety   2. Heart murmur    PLAN:    In order of problems listed above:  ***   Next appointment: ***   Medication Adjustments/Labs and Tests Ordered: Current medicines are reviewed at length with the patient today.  Concerns regarding medicines are outlined above.  No orders of the defined types were placed in this encounter.  No orders of the defined types were placed in this encounter.    History of Present Illness:    Brenda Mcmahon is a 33 y.o. female with a hx of SVT last seen 09/07/2022. Compliance with diet, lifestyle and medications: *** Past Medical History:  Diagnosis Date   Achilles bursitis of right lower extremity 03/14/2017   Anxiety    Chronic right shoulder pain 03/16/2023   Depression    Dysrhythmia    Encounter for other contraceptive management 08/07/2021   GAD (generalized anxiety disorder) 08/07/2021   Heart murmur    Moderate recurrent major depression (HCC) 08/07/2021   Need for DTaP vaccine 09/18/2021   Other insomnia 10/14/2022   Palpitation    Post-traumatic stress disorder, chronic 02/22/2023   Recurrent major depression-severe (HCC) 03/14/2017   Routine medical exam 09/18/2021    Current Medications: No outpatient medications have been marked as taking for the 05/08/23 encounter (Appointment) with Baldo Daub, MD.      EKGs/Labs/Other Studies Reviewed:    The following studies were reviewed today:  Cardiac Studies & Procedures      ECHOCARDIOGRAM  ECHOCARDIOGRAM COMPLETE 07/29/2020  Narrative ECHOCARDIOGRAM REPORT    Patient Name:   Brenda Mcmahon [facility] Date of Exam: 07/29/2020 Medical Rec #:  [id number]           Height:       62.0 in Accession #:    [id number]          Weight:       124.1  lb Date of Birth:  1990/08/23            BSA:          1.561 m Patient Age:    29 years            BP:           120/58 mmHg Patient Gender: F                   HR:           85 bpm. Exam Location:  [address]  Procedure: 2D Echo, 3D Echo, Cardiac Doppler, Color Doppler and Strain Analysis  Indications:    R00.2 Palpitations  History:        Patient has no prior history of Echocardiogram examinations.  Sonographer:    Daphine Deutscher RDCS Referring Phys: [id number] Evita Merida J Cayce Paschal  IMPRESSIONS   1. Left ventricular ejection fraction, by estimation, is 70 to 75%. Left ventricular ejection fraction by 3D volume is 69 %. The left ventricle has hyperdynamic function. The left ventricle has no regional wall motion abnormalities. Left ventricular diastolic parameters were normal. The average left ventricular global longitudinal strain is -28.5 %. The global longitudinal strain is normal. 2. Right ventricular systolic function is normal. The right ventricular size is normal. 3.  The mitral valve is normal in structure. No evidence of mitral valve regurgitation. No evidence of mitral stenosis. 4. The aortic valve is tricuspid. Aortic valve regurgitation is not visualized. No aortic stenosis is present. 5. The inferior vena cava is normal in size with greater than 50% respiratory variability, suggesting right atrial pressure of 3 mmHg.  Comparison(s): No prior Echocardiogram.  FINDINGS Left Ventricle: Left ventricular ejection fraction, by estimation, is 70 to 75%. Left ventricular ejection fraction by 3D volume is 69 %. The left ventricle has hyperdynamic function. The left ventricle has no regional wall motion abnormalities. The average left ventricular global longitudinal strain is -28.5 %. The global longitudinal strain is normal. The left ventricular internal cavity size was normal in size. There is no left ventricular hypertrophy. Left ventricular diastolic parameters  were normal.  Right Ventricle: The right ventricular size is normal. No increase in right ventricular wall thickness. Right ventricular systolic function is normal.  Left Atrium: Left atrial size was normal in size.  Right Atrium: Right atrial size was normal in size.  Pericardium: There is no evidence of pericardial effusion.  Mitral Valve: The mitral valve is normal in structure. No evidence of mitral valve regurgitation. No evidence of mitral valve stenosis.  Tricuspid Valve: The tricuspid valve is normal in structure. Tricuspid valve regurgitation is trivial.  Aortic Valve: The aortic valve is tricuspid. Aortic valve regurgitation is not visualized. No aortic stenosis is present.  Pulmonic Valve: The pulmonic valve was normal in structure. Pulmonic valve regurgitation is trivial.  Aorta: The aortic root and ascending aorta are structurally normal, with no evidence of dilitation.  Venous: The inferior vena cava is normal in size with greater than 50% respiratory variability, suggesting right atrial pressure of 3 mmHg.  IAS/Shunts: No atrial level shunt detected by color flow Doppler.   LEFT VENTRICLE PLAX 2D LVIDd:         4.50 cm         Diastology LVIDs:         2.50 cm         LV e' medial:    13.50 cm/s LV PW:         0.80 cm         LV E/e' medial:  6.9 LV IVS:        0.50 cm         LV e' lateral:   15.10 cm/s LVOT diam:     1.80 cm         LV E/e' lateral: 6.1 LV SV:         56 LV SV Index:   36              2D LVOT Area:     2.54 cm        Longitudinal Strain 2D Strain GLS  -28.1 % (A2C): 2D Strain GLS  -29.1 % (A3C): 2D Strain GLS  -28.3 % (A4C): 2D Strain GLS  -28.5 % Avg:  3D Volume EF LV 3D EF:    Left ventricular ejection fraction by 3D volume is 69 %.  3D Volume EF: 3D EF:        69 % LV EDV:       104 ml LV ESV:       32 ml LV SV:        71 ml  RIGHT VENTRICLE             IVC RV Basal diam:  3.10 cm  IVC diam: 1.00 cm RV S prime:      17.80 cm/s TAPSE (M-mode): 2.8 cm  LEFT ATRIUM             Index       RIGHT ATRIUM          Index LA diam:        3.90 cm 2.50 cm/m  RA Area:     8.63 cm LA Vol (A2C):   45.1 ml 28.90 ml/m RA Volume:   17.60 ml 11.28 ml/m LA Vol (A4C):   39.6 ml 25.37 ml/m LA Biplane Vol: 42.8 ml 27.42 ml/m AORTIC VALVE LVOT Vmax:   117.50 cm/s LVOT Vmean:  88.000 cm/s LVOT VTI:    0.219 m  AORTA Ao Root diam: 2.50 cm Ao Asc diam:  2.30 cm  MITRAL VALVE               TRICUSPID VALVE MV Area (PHT): 2.95 cm    TR Peak grad:   21.3 mmHg MV Decel Time: 257 msec    TR Vmax:        231.00 cm/s MV E velocity: 92.60 cm/s MV A velocity: 57.20 cm/s  SHUNTS MV E/A ratio:  1.62        Systemic VTI:  0.22 m Systemic Diam: 1.80 cm  Laurance Flatten MD Electronically signed by Laurance Flatten MD Signature Date/Time: 07/29/2020/5:42:07 PM    Final   MONITORS  LONG TERM MONITOR-LIVE TELEMETRY (3-14 DAYS) 08/12/2020  Narrative Patch Wear Time:  14 days and 0 hours (2022-04-11T16:24:43-0400 to 2022-04-25T16:24:43-0400)  Patient had a min HR of 45 bpm, max HR of 175 bpm, and avg HR of 82 bpm. Predominant underlying rhythm was Sinus Rhythm. Isolated SVEs were rare (<1.0%), SVE Triplets were rare (<1.0%), and no SVE Couplets were present. Isolated VEs were rare (<1.0%), and no VE Couplets or VE Triplets were present.  There were 13 triggered and 6 diary events all associated with sinus rhythm and sinus tachycardia 1 with a single atrial premature contraction.  Ventricular ectopy was rare.  Supraventricular ectopy was rare without atrial fibrillation or flutter.  Were no pauses of 3 seconds or greater and no episodes of second or third-degree AV node block or sinus node exit block.          EKG Interpretation Date/Time:  Tuesday May 08 2023 16:49:04 EST Ventricular Rate:  75 PR Interval:  128 QRS Duration:  92 QT Interval:  376 QTC Calculation: 419 R Axis:   75  Text  Interpretation: Normal sinus rhythm Normal ECG No previous ECGs available Confirmed by Norman Herrlich ([id number]) on 05/08/2023 4:58:58 PM   Recent Labs: 10/10/2022: TSH 1.530 03/19/2023: Hemoglobin 12.4; Platelets 223  Recent Lipid Panel    Component Value Date/Time   CHOL 160 10/10/2022 1527   TRIG 91 10/10/2022 1527   HDL 63 10/10/2022 1527   CHOLHDL 2.5 10/10/2022 1527   LDLCALC 80 10/10/2022 1527    Physical Exam:    VS:  There were no vitals taken for this visit.    Wt Readings from Last 3 Encounters:  03/28/23 119 lb 14.9 oz (54.4 kg)  03/19/23 120 lb (54.4 kg)  03/17/23 120 lb (54.4 kg)     GEN: *** Well nourished, well developed in no acute distress HEENT: Normal NECK: No JVD; No carotid bruits LYMPHATICS: No lymphadenopathy CARDIAC: ***RRR, no murmurs, rubs, gallops RESPIRATORY:  Clear to auscultation without rales, wheezing or rhonchi  ABDOMEN: Soft, non-tender, non-distended MUSCULOSKELETAL:  No edema; No deformity  SKIN: Warm and dry NEUROLOGIC:  Alert and oriented x 3 PSYCHIATRIC:  Normal affect    Signed, Norman Herrlich, MD  05/07/2023 8:02 PM    [facility]

## 2023-05-08 ENCOUNTER — Ambulatory Visit: Payer: Commercial Managed Care - PPO | Attending: Cardiology | Admitting: Cardiology

## 2023-05-08 ENCOUNTER — Other Ambulatory Visit (HOSPITAL_COMMUNITY): Payer: Self-pay

## 2023-05-08 ENCOUNTER — Encounter (HOSPITAL_COMMUNITY): Payer: Self-pay

## 2023-05-08 ENCOUNTER — Encounter: Payer: Self-pay | Admitting: Cardiology

## 2023-05-08 ENCOUNTER — Encounter: Payer: Self-pay | Admitting: Physical Therapy

## 2023-05-08 ENCOUNTER — Ambulatory Visit: Payer: PRIVATE HEALTH INSURANCE | Admitting: Physical Therapy

## 2023-05-08 VITALS — BP 108/72 | HR 75 | Ht 62.0 in | Wt 123.2 lb

## 2023-05-08 DIAGNOSIS — G8929 Other chronic pain: Secondary | ICD-10-CM

## 2023-05-08 DIAGNOSIS — M6281 Muscle weakness (generalized): Secondary | ICD-10-CM

## 2023-05-08 DIAGNOSIS — M25511 Pain in right shoulder: Secondary | ICD-10-CM | POA: Diagnosis not present

## 2023-05-08 DIAGNOSIS — I471 Supraventricular tachycardia, unspecified: Secondary | ICD-10-CM | POA: Diagnosis not present

## 2023-05-08 MED ORDER — ACEBUTOLOL HCL 200 MG PO CAPS
200.0000 mg | ORAL_CAPSULE | Freq: Every day | ORAL | 3 refills | Status: AC | PRN
  Filled 2023-05-08: qty 90, 90d supply, fill #0

## 2023-05-08 NOTE — Patient Instructions (Signed)
Medication Instructions:  Your physician has recommended you make the following change in your medication:   START: Acebutolol 200 mg daily as needed for rapid heart rate  *If you need a refill on your cardiac medications before your next appointment, please call your pharmacy*   Lab Work: None If you have labs (blood work) drawn today and your tests are completely normal, you will receive your results only by: MyChart Message (if you have MyChart) OR A paper copy in the mail If you have any lab test that is abnormal or we need to change your treatment, we will call you to review the results.   Testing/Procedures: None   Follow-Up: At [facility], you and your health needs are our priority.  As part of our continuing mission to provide you with exceptional heart care, we have created designated Provider Care Teams.  These Care Teams include your primary Cardiologist (physician) and Advanced Practice Providers (APPs -  Physician Assistants and Nurse Practitioners) who all work together to provide you with the care you need, when you need it.  We recommend signing up for the patient portal called "MyChart".  Sign up information is provided on this After Visit Summary.  MyChart is used to connect with patients for Virtual Visits (Telemedicine).  Patients are able to view lab/test results, encounter notes, upcoming appointments, etc.  Non-urgent messages can be sent to your provider as well.   To learn more about what you can do with MyChart, go to [email or web address].    Your next appointment:   1 year(s)  Provider:   Norman Herrlich, MD    Other Instructions None

## 2023-05-08 NOTE — Progress Notes (Unsigned)
Cardiology Office Note:    Date:  05/08/2023   ID:  Brenda Mcmahon, DOB 08/26/1990, MRN [id number]  PCP:  Blane Ohara, MD  Cardiologist:  Norman Herrlich, MD    Referring MD: Blane Ohara, MD    ASSESSMENT:    1. SVT (supraventricular tachycardia) (HCC)    PLAN:    In order of problems listed above:  Much improved will take the beta-blocker as needed and I will see her back in the office in 1 year or she can contact me through MyChart   Next appointment: 1 year   Medication Adjustments/Labs and Tests Ordered: Current medicines are reviewed at length with the patient today.  Concerns regarding medicines are outlined above.  Orders Placed This Encounter  Procedures   EKG 12-Lead   No orders of the defined types were placed in this encounter.    History of Present Illness:    Brenda Mcmahon is a 33 y.o. female with a hx of SVT last seen 09/07/2022. 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 Pristiq has been remarkably effective for the quality of her life but also has alleviated her arrhythmia and presently she does not need to take her beta-blocker At times she gets palpitation at nighttime and I told her if it was severe or bothersome she can take a dose as needed Past Medical History:  Diagnosis Date   Achilles bursitis of right lower extremity 03/14/2017   Anxiety    Chronic right shoulder pain 03/16/2023   Depression    Dysrhythmia    Encounter for other contraceptive management 08/07/2021   GAD (generalized anxiety disorder) 08/07/2021   Heart murmur    Moderate recurrent major depression (HCC) 08/07/2021   Need for DTaP vaccine 09/18/2021   Other insomnia 10/14/2022   Palpitation    Post-traumatic stress disorder, chronic 02/22/2023   Recurrent major depression-severe (HCC) 03/14/2017   Routine medical exam  09/18/2021    Current Medications: Current Meds  Medication Sig   desvenlafaxine (PRISTIQ) 100 MG 24 hr tablet Take 1 tablet (100 mg total) by mouth every morning.   zolpidem (AMBIEN) 5 MG tablet Take 1 tablet (5 mg total) by mouth at bedtime as needed for sleep.      EKGs/Labs/Other Studies Reviewed:    The following studies were reviewed today:  Cardiac Studies & Procedures      ECHOCARDIOGRAM  ECHOCARDIOGRAM COMPLETE 07/29/2020  Narrative ECHOCARDIOGRAM REPORT    Patient Name:   Brenda Mcmahon Date of Exam: 07/29/2020 Medical Rec #:  [id number]           Height:       62.0 in Accession #:    [id number]          Weight:       124.1 lb Date of Birth:  1991/04/07            BSA:          1.561 m Patient Age:    29 years            BP:           120/58 mmHg Patient Gender: F                   HR:           85 bpm. Exam Location:  [address]  Procedure: 2D Echo, 3D Echo, Cardiac Doppler, Color Doppler and Strain Analysis  Indications:    R00.2 Palpitations  History:        Patient has no prior history of Echocardiogram examinations.  Sonographer:    Daphine Deutscher RDCS Referring Phys: [id number] Braeley Buskey J Aine Strycharz  IMPRESSIONS   1. Left ventricular ejection fraction, by estimation, is 70 to 75%. Left ventricular ejection fraction by 3D volume is 69 %. The left ventricle has hyperdynamic function. The left ventricle has no regional wall motion abnormalities. Left ventricular diastolic parameters were normal. The average left ventricular global longitudinal strain is -28.5 %. The global longitudinal strain is normal. 2. Right ventricular systolic function is normal. The right ventricular size is normal. 3. The mitral valve is normal in structure. No evidence of mitral valve regurgitation. No evidence of mitral stenosis. 4. The aortic valve is tricuspid. Aortic valve regurgitation is not visualized. No aortic stenosis is present. 5. The inferior vena cava is normal  in size with greater than 50% respiratory variability, suggesting right atrial pressure of 3 mmHg.  Comparison(s): No prior Echocardiogram.  FINDINGS Left Ventricle: Left ventricular ejection fraction, by estimation, is 70 to 75%. Left ventricular ejection fraction by 3D volume is 69 %. The left ventricle has hyperdynamic function. The left ventricle has no regional wall motion abnormalities. The average left ventricular global longitudinal strain is -28.5 %. The global longitudinal strain is normal. The left ventricular internal cavity size was normal in size. There is no left ventricular hypertrophy. Left ventricular diastolic parameters were normal.  Right Ventricle: The right ventricular size is normal. No increase in right ventricular wall thickness. Right ventricular systolic function is normal.  Left Atrium: Left atrial size was normal in size.  Right Atrium: Right atrial size was normal in size.  Pericardium: There is no evidence of pericardial effusion.  Mitral Valve: The mitral valve is normal in structure. No evidence of mitral valve regurgitation. No evidence of mitral valve stenosis.  Tricuspid Valve: The tricuspid valve is normal in structure. Tricuspid valve regurgitation is trivial.  Aortic Valve: The aortic valve is tricuspid. Aortic valve regurgitation is not visualized. No aortic stenosis is present.  Pulmonic Valve: The pulmonic valve was normal in structure. Pulmonic valve regurgitation is trivial.  Aorta: The aortic root and ascending aorta are structurally normal, with no evidence of dilitation.  Venous: The inferior vena cava is normal in size with greater than 50% respiratory variability, suggesting right atrial pressure of 3 mmHg.  IAS/Shunts: No atrial level shunt detected by color flow Doppler.   LEFT VENTRICLE PLAX 2D LVIDd:         4.50 cm         Diastology LVIDs:         2.50 cm         LV e' medial:    13.50 cm/s LV PW:         0.80 cm         LV  E/e' medial:  6.9 LV IVS:        0.50 cm         LV e' lateral:   15.10 cm/s LVOT diam:     1.80 cm         LV E/e' lateral: 6.1 LV SV:         56 LV SV Index:   36              2D LVOT Area:  2.54 cm        Longitudinal Strain 2D Strain GLS  -28.1 % (A2C): 2D Strain GLS  -29.1 % (A3C): 2D Strain GLS  -28.3 % (A4C): 2D Strain GLS  -28.5 % Avg:  3D Volume EF LV 3D EF:    Left ventricular ejection fraction by 3D volume is 69 %.  3D Volume EF: 3D EF:        69 % LV EDV:       104 ml LV ESV:       32 ml LV SV:        71 ml  RIGHT VENTRICLE             IVC RV Basal diam:  3.10 cm     IVC diam: 1.00 cm RV S prime:     17.80 cm/s TAPSE (M-mode): 2.8 cm  LEFT ATRIUM             Index       RIGHT ATRIUM          Index LA diam:        3.90 cm 2.50 cm/m  RA Area:     8.63 cm LA Vol (A2C):   45.1 ml 28.90 ml/m RA Volume:   17.60 ml 11.28 ml/m LA Vol (A4C):   39.6 ml 25.37 ml/m LA Biplane Vol: 42.8 ml 27.42 ml/m AORTIC VALVE LVOT Vmax:   117.50 cm/s LVOT Vmean:  88.000 cm/s LVOT VTI:    0.219 m  AORTA Ao Root diam: 2.50 cm Ao Asc diam:  2.30 cm  MITRAL VALVE               TRICUSPID VALVE MV Area (PHT): 2.95 cm    TR Peak grad:   21.3 mmHg MV Decel Time: 257 msec    TR Vmax:        231.00 cm/s MV E velocity: 92.60 cm/s MV A velocity: 57.20 cm/s  SHUNTS MV E/A ratio:  1.62        Systemic VTI:  0.22 m Systemic Diam: 1.80 cm  Laurance Flatten MD Electronically signed by Laurance Flatten MD Signature Date/Time: 07/29/2020/5:42:07 PM    Final   MONITORS  LONG TERM MONITOR-LIVE TELEMETRY (3-14 DAYS) 08/12/2020  Narrative Patch Wear Time:  14 days and 0 hours (2022-04-11T16:24:43-0400 to 2022-04-25T16:24:43-0400)  Patient had a min HR of 45 bpm, max HR of 175 bpm, and avg HR of 82 bpm. Predominant underlying rhythm was Sinus Rhythm. Isolated SVEs were rare (<1.0%), SVE Triplets were rare (<1.0%), and no SVE Couplets were present. Isolated VEs were rare  (<1.0%), and no VE Couplets or VE Triplets were present.  There were 13 triggered and 6 diary events all associated with sinus rhythm and sinus tachycardia 1 with a single atrial premature contraction.  Ventricular ectopy was rare.  Supraventricular ectopy was rare without atrial fibrillation or flutter.  Were no pauses of 3 seconds or greater and no episodes of second or third-degree AV node block or sinus node exit block.           EKG Interpretation Date/Time:  Tuesday May 08 2023 16:49:04 EST Ventricular Rate:  75 PR Interval:  128 QRS Duration:  92 QT Interval:  376 QTC Calculation: 419 R Axis:   75  Text Interpretation: Normal sinus rhythm Normal ECG No previous ECGs available Confirmed by Norman Herrlich ([id number]) on 05/08/2023 4:58:58 PM   Recent Labs: 10/10/2022: TSH 1.530 03/19/2023: Hemoglobin 12.4; Platelets 223  Recent Lipid Panel  Component Value Date/Time   CHOL 160 10/10/2022 1527   TRIG 91 10/10/2022 1527   HDL 63 10/10/2022 1527   CHOLHDL 2.5 10/10/2022 1527   LDLCALC 80 10/10/2022 1527    Physical Exam:    VS:  BP 108/72   Pulse 75   Ht 5\' 2"  (1.575 m)   Wt 123 lb 3.2 oz (55.9 kg)   SpO2 95%   BMI 22.53 kg/m     Wt Readings from Last 3 Encounters:  05/08/23 123 lb 3.2 oz (55.9 kg)  03/28/23 119 lb 14.9 oz (54.4 kg)  03/19/23 120 lb (54.4 kg)     GEN:  Well nourished, well developed in no acute distress HEENT: Normal NECK: No JVD; No carotid bruits LYMPHATICS: No lymphadenopathy CARDIAC: RRR, no murmurs, rubs, gallops RESPIRATORY:  Clear to auscultation without rales, wheezing or rhonchi  ABDOMEN: Soft, non-tender, non-distended MUSCULOSKELETAL:  No edema; No deformity  SKIN: Warm and dry NEUROLOGIC:  Alert and oriented x 3 PSYCHIATRIC:  Normal affect    Signed, Norman Herrlich, MD  05/08/2023 4:59 PM    [facility]

## 2023-05-09 ENCOUNTER — Ambulatory Visit: Payer: 59 | Admitting: Physical Therapy

## 2023-05-09 ENCOUNTER — Other Ambulatory Visit (HOSPITAL_COMMUNITY): Payer: Self-pay

## 2023-05-10 ENCOUNTER — Other Ambulatory Visit (HOSPITAL_COMMUNITY): Payer: Self-pay

## 2023-05-11 ENCOUNTER — Encounter: Payer: 59 | Admitting: Physical Therapy

## 2023-05-11 ENCOUNTER — Other Ambulatory Visit (HOSPITAL_COMMUNITY): Payer: Self-pay

## 2023-05-11 DIAGNOSIS — F4312 Post-traumatic stress disorder, chronic: Secondary | ICD-10-CM | POA: Diagnosis not present

## 2023-05-11 DIAGNOSIS — F332 Major depressive disorder, recurrent severe without psychotic features: Secondary | ICD-10-CM | POA: Diagnosis not present

## 2023-05-11 MED ORDER — DESVENLAFAXINE SUCCINATE ER 100 MG PO TB24
100.0000 mg | ORAL_TABLET | Freq: Every day | ORAL | 0 refills | Status: AC
  Filled 2023-05-11 – 2023-07-13 (×4): qty 90, 90d supply, fill #0

## 2023-05-11 MED ORDER — ZOLPIDEM TARTRATE 5 MG PO TABS
5.0000 mg | ORAL_TABLET | Freq: Every evening | ORAL | 0 refills | Status: DC | PRN
  Filled 2023-05-11: qty 30, 30d supply, fill #0

## 2023-05-13 NOTE — Progress Notes (Unsigned)
Subjective:  Patient ID: Brenda Mcmahon, female    DOB: [birth year]  Age: 33 y.o. MRN: [id number]  Chief Complaint  Patient presents with   Medical Management of Chronic Issues    HPI Depression: Taking Pristiq 100 mg daily. Ambien  5 mg before bed as needed insomnia.   The patient, with a history of depression, presents for follow-up. The patient's depression has been 'rough' in recent days, exacerbated by job loss. She was terminated from her position at [employer], which has caused significant distress. She is currently job hunting and relying on financial support from her godparents. Her psychiatrist has refilled her Pristiq and Ambien prescriptions, and she is also attending therapy sessions. She has had suicidal thoughts but does not believe she would act on them. She is making efforts to establish a routine and improve her living environment, such as getting up before 10 am, hydrating, taking her medication, and opening her blinds. See's Psychiatry, Dr. Tonna Corner every 2 months. See's a therapist, Ashley Jacobs 1-3 times a week. Both at [employer].  She had right shoulder surgery which was workman's compensation. She reports an allergic reaction to the anesthesia, which manifested as redness around the surgical site. The reaction was managed with Benadryl and a home steroid, and the patient is now in physical therapy for the shoulder. She describes the shoulder as 'still sore' but improving.  Patient has svt. Cardiology recently changed acebutolol to as needed versus scheduled.      05/14/2023   10:25 AM 03/13/2023    8:00 AM 12/12/2022    3:47 PM 11/07/2022    4:05 PM 10/10/2022    2:29 PM  Depression screen PHQ 2/9  Decreased Interest 1 3 1 2 2   Down, Depressed, Hopeless 1 3 2 2 2   PHQ - 2 Score 2 6 3 4 4   Altered sleeping 1 3 2 2 3   Tired, decreased energy 1 3 1 2 3   Change in appetite 1 3 2 2 3   Feeling bad or failure about yourself  1 3 2 2 3   Trouble concentrating 1 3  1 2 3   Moving slowly or fidgety/restless 1 3 1 2 3   Suicidal thoughts 1 3 1 2 3   PHQ-9 Score 9 27 13 18 25   Difficult doing work/chores Somewhat difficult Extremely dIfficult Somewhat difficult Somewhat difficult Very difficult        03/13/2023    8:00 AM  Fall Risk   Falls in the past year? 0  Number falls in past yr: 0  Injury with Fall? 0  Risk for fall due to : No Fall Risks  Follow up Falls evaluation completed    Patient Care Team: Blane Ohara, MD as PCP - General (Family Medicine)   Review of Systems  Constitutional:  Negative for chills, fatigue and fever.  HENT:  Negative for congestion, ear pain, rhinorrhea and sore throat.   Respiratory:  Negative for cough and shortness of breath.   Cardiovascular:  Negative for chest pain.  Gastrointestinal:  Negative for abdominal pain, constipation, diarrhea, nausea and vomiting.  Genitourinary:  Negative for dysuria and urgency.  Musculoskeletal:  Negative for back pain and myalgias.  Neurological:  Negative for dizziness, weakness, light-headedness and headaches.  Psychiatric/Behavioral:  Negative for dysphoric mood. The patient is not nervous/anxious.     Current Outpatient Medications on File Prior to Visit  Medication Sig Dispense Refill   acebutolol (SECTRAL) 200 MG capsule Take 1 capsule (200 mg total) by  mouth daily as needed (for rapid heart rate). 90 capsule 3   desvenlafaxine (PRISTIQ) 100 MG 24 hr tablet Take 1 tablet (100 mg total) by mouth daily. 90 tablet 0   fluticasone (FLONASE) 50 MCG/ACT nasal spray Place 2 sprays into both nostrils daily. 16 g 6   zolpidem (AMBIEN) 5 MG tablet Take 1 tablet (5 mg total) by mouth at bedtime as needed for sleep 30 tablet 0   No current facility-administered medications on file prior to visit.   Past Medical History:  Diagnosis Date   Achilles bursitis of right lower extremity 03/14/2017   Anxiety    Chronic right shoulder pain 03/16/2023   Depression 08/07/2021    Dysrhythmia    Encounter for other contraceptive management 08/07/2021   GAD (generalized anxiety disorder) 08/07/2021   Heart murmur    Moderate recurrent major depression (HCC) 08/07/2021   Need for DTaP vaccine 09/18/2021   Other insomnia 10/14/2022   Palpitation    Post-traumatic stress disorder, chronic 02/22/2023   Recurrent major depression-severe (HCC) 03/14/2017   Routine medical exam 09/18/2021   Past Surgical History:  Procedure Laterality Date   FOOT FRACTURE SURGERY Right 2015   also ankle fracture repaired.    SHOULDER ARTHROSCOPY WITH ROTATOR CUFF REPAIR AND SUBACROMIAL DECOMPRESSION Right 03/28/2023   Procedure: SHOULDER ARTHROSCOPY  AND SUBACROMIAL DECOMPRESSION DEBRIDEMENT;  Surgeon: Jene Every, MD;  Location: [facility];  Service: Orthopedics;  Laterality: Right;   WRIST FRACTURE SURGERY Left    33 yo.  fell through glass door.     Family History  Problem Relation Age of Onset   Seizures Mother    Hypotension Mother    Asthma Brother    Seizures Maternal Grandfather    Social History   Socioeconomic History   Marital status: Single    Spouse name: Not on file   Number of children: Not on file   Years of education: Not on file   Highest education level: Not on file  Occupational History   Occupation: Pharmacologist    Employer: [employer]  Tobacco Use   Smoking status: Never   Smokeless tobacco: Never  Vaping Use   Vaping status: Never Used  Substance and Sexual Activity   Alcohol use: No   Drug use: Never   Sexual activity: Not on file  Other Topics Concern   Not on file  Social History Narrative   Not on file   Social Drivers of Health   Financial Resource Strain: Low Risk  (09/13/2021)   Overall Financial Resource Strain (CARDIA)    Difficulty of Paying Living Expenses: Not very hard  Food Insecurity: No Food Insecurity (09/13/2021)   Hunger Vital Sign    Worried About Running Out of Food in the Last Year: Never true    Ran Out of  Food in the Last Year: Never true  Transportation Needs: No Transportation Needs (09/13/2021)   PRAPARE - Administrator, Civil Service (Medical): No    Lack of Transportation (Non-Medical): No  Physical Activity: Inactive (10/10/2022)   Exercise Vital Sign    Days of Exercise per Week: 0 days    Minutes of Exercise per Session: 0 min  Stress: No Stress Concern Present (10/10/2022)   [employer] of Occupational Health - Occupational Stress Questionnaire    Feeling of Stress : Not at all  Social Connections: Moderately Isolated (10/10/2022)   Social Connection and Isolation Panel [NHANES]    Frequency of Communication with Friends and  Family: Three times a week    Frequency of Social Gatherings with Friends and Family: Three times a week    Attends Religious Services: More than 4 times per year    Active Member of Clubs or Organizations: No    Attends Banker Meetings: Never    Marital Status: Never married    Objective:  BP 124/68   Pulse 97   Temp 98.2 F (36.8 C)   Ht 5\' 2"  (1.575 m)   Wt 120 lb (54.4 kg)   SpO2 100%   BMI 21.95 kg/m      05/14/2023   10:00 AM 05/08/2023    4:46 PM 03/28/2023    3:45 PM  BP/Weight  Systolic BP 124 108 114  Diastolic BP 68 72 71  Wt. (Lbs) 120 123.2   BMI 21.95 kg/m2 22.53 kg/m2     Physical Exam Vitals reviewed.  Constitutional:      Appearance: Normal appearance. She is normal weight.  Neck:     Vascular: No carotid bruit.  Cardiovascular:     Rate and Rhythm: Normal rate and regular rhythm.     Heart sounds: Normal heart sounds.  Pulmonary:     Effort: Pulmonary effort is normal. No respiratory distress.     Breath sounds: Normal breath sounds.  Abdominal:     General: Abdomen is flat. Bowel sounds are normal.     Palpations: Abdomen is soft.     Tenderness: There is no abdominal tenderness.  Musculoskeletal:        General: Normal range of motion.  Neurological:     Mental Status: She is  alert and oriented to person, place, and time.  Psychiatric:        Mood and Affect: Mood normal.        Behavior: Behavior normal.     Diabetic Foot Exam - Simple   No data filed      Lab Results  Component Value Date   WBC 8.0 03/19/2023   HGB 12.4 03/19/2023   HCT 39.1 03/19/2023   PLT 223 03/19/2023   GLUCOSE 84 09/13/2021   CHOL 160 10/10/2022   TRIG 91 10/10/2022   HDL 63 10/10/2022   LDLCALC 80 10/10/2022   ALT 14 09/13/2021   AST 20 09/13/2021   NA 137 09/13/2021   K 4.5 09/13/2021   CL 102 09/13/2021   CREATININE 0.88 09/13/2021   BUN 7 09/13/2021   CO2 22 09/13/2021   TSH 1.530 10/10/2022      Assessment & Plan:    GAD (generalized anxiety disorder) Assessment & Plan: Recent exacerbation due to job loss. Currently managed by psychiatrist with Pristiq 100mg  daily. Patient reports some improvement with therapy and lifestyle modifications. -Continue current psychiatric management and therapy. -Continue Pristiq 100mg  daily.   Other insomnia Assessment & Plan: The current medical regimen is effective;  continue present plan and medication. Continue ambien.   Moderate recurrent major depression (HCC) Assessment & Plan: Greatly improved, but still not at goal.  -Continue current psychiatric management and therapy. -Continue Pristiq 100mg  daily.   SVT (supraventricular tachycardia) (HCC) Assessment & Plan: Acebutolol changed to PRN by cardiologist, no recent need reported. -Continue Acebutolol PRN as directed by cardiologist.       No orders of the defined types were placed in this encounter.   No orders of the defined types were placed in this encounter.    Follow-up: Return in about 6 months (around 11/11/2023) for chronic follow up.  I,Marla I Leal-Borjas,acting as a scribe for Blane Ohara, MD.,have documented all relevant documentation on the behalf of Blane Ohara, MD,as directed by  Blane Ohara, MD while in the presence of Blane Ohara, MD.   An After Visit Summary was printed and given to the patient.  I attest that I have reviewed this visit and agree with the plan scribed by my staff.   Blane Ohara, MD [facility] [phone]

## 2023-05-14 ENCOUNTER — Ambulatory Visit (INDEPENDENT_AMBULATORY_CARE_PROVIDER_SITE_OTHER): Payer: Commercial Managed Care - PPO | Admitting: Family Medicine

## 2023-05-14 ENCOUNTER — Encounter: Payer: Self-pay | Admitting: Family Medicine

## 2023-05-14 ENCOUNTER — Other Ambulatory Visit (HOSPITAL_COMMUNITY): Payer: Self-pay

## 2023-05-14 ENCOUNTER — Encounter: Payer: 59 | Admitting: Physical Therapy

## 2023-05-14 VITALS — BP 124/68 | HR 97 | Temp 98.2°F | Ht 62.0 in | Wt 120.0 lb

## 2023-05-14 DIAGNOSIS — F331 Major depressive disorder, recurrent, moderate: Secondary | ICD-10-CM | POA: Diagnosis not present

## 2023-05-14 DIAGNOSIS — G4709 Other insomnia: Secondary | ICD-10-CM | POA: Diagnosis not present

## 2023-05-14 DIAGNOSIS — F332 Major depressive disorder, recurrent severe without psychotic features: Secondary | ICD-10-CM

## 2023-05-14 DIAGNOSIS — I471 Supraventricular tachycardia, unspecified: Secondary | ICD-10-CM

## 2023-05-14 DIAGNOSIS — F411 Generalized anxiety disorder: Secondary | ICD-10-CM

## 2023-05-14 NOTE — Assessment & Plan Note (Signed)
Recent exacerbation due to job loss. Currently managed by psychiatrist with Pristiq 100mg  daily. Patient reports some improvement with therapy and lifestyle modifications. -Continue current psychiatric management and therapy. -Continue Pristiq 100mg  daily.

## 2023-05-15 ENCOUNTER — Encounter: Payer: 59 | Admitting: Physical Therapy

## 2023-05-16 DIAGNOSIS — I471 Supraventricular tachycardia, unspecified: Secondary | ICD-10-CM | POA: Insufficient documentation

## 2023-05-16 NOTE — Assessment & Plan Note (Signed)
Greatly improved, but still not at goal.  -Continue current psychiatric management and therapy. -Continue Pristiq 100mg  daily.

## 2023-05-16 NOTE — Assessment & Plan Note (Signed)
The current medical regimen is effective;  continue present plan and medication. Continue ambien.

## 2023-05-16 NOTE — Assessment & Plan Note (Signed)
Acebutolol changed to PRN by cardiologist, no recent need reported. -Continue Acebutolol PRN as directed by cardiologist.

## 2023-05-17 ENCOUNTER — Ambulatory Visit: Payer: PRIVATE HEALTH INSURANCE | Admitting: Physical Therapy

## 2023-05-17 ENCOUNTER — Encounter: Payer: Self-pay | Admitting: Physical Therapy

## 2023-05-17 ENCOUNTER — Other Ambulatory Visit: Payer: Self-pay

## 2023-05-17 DIAGNOSIS — M25511 Pain in right shoulder: Secondary | ICD-10-CM | POA: Diagnosis not present

## 2023-05-17 DIAGNOSIS — M6281 Muscle weakness (generalized): Secondary | ICD-10-CM

## 2023-05-17 DIAGNOSIS — G8929 Other chronic pain: Secondary | ICD-10-CM

## 2023-05-17 NOTE — Therapy (Addendum)
OUTPATIENT PHYSICAL THERAPY TREATMENT   Patient Name: Brenda Mcmahon MRN: [id number] DOB:1990-10-22, 33 y.o., female Today's Date: 05/17/2023   END OF SESSION:  PT End of Session - 05/17/23 0753     Visit Number 7    Number of Visits 17    Date for PT Re-Evaluation 06/14/23    Authorization Type Worker's Comp    Authorization Time Period complete by 06/12/2023    Authorization - Visit Number 1    Authorization - Number of Visits 8    PT Start Time 0800    PT Stop Time 0845    PT Time Calculation (min) 45 min    Activity Tolerance Patient tolerated treatment well    Behavior During Therapy [facility] for tasks assessed/performed              Past Medical History:  Diagnosis Date   Achilles bursitis of right lower extremity 03/14/2017   Anxiety    Chronic right shoulder pain 03/16/2023   Depression 08/07/2021   Dysrhythmia    Encounter for other contraceptive management 08/07/2021   GAD (generalized anxiety disorder) 08/07/2021   Heart murmur    Moderate recurrent major depression (HCC) 08/07/2021   Need for DTaP vaccine 09/18/2021   Other insomnia 10/14/2022   Palpitation    Post-traumatic stress disorder, chronic 02/22/2023   Recurrent major depression-severe (HCC) 03/14/2017   Routine medical exam 09/18/2021   Past Surgical History:  Procedure Laterality Date   FOOT FRACTURE SURGERY Right 2015   also ankle fracture repaired.    SHOULDER ARTHROSCOPY WITH ROTATOR CUFF REPAIR AND SUBACROMIAL DECOMPRESSION Right 03/28/2023   Procedure: SHOULDER ARTHROSCOPY  AND SUBACROMIAL DECOMPRESSION DEBRIDEMENT;  Surgeon: Jene Every, MD;  Location: [facility];  Service: Orthopedics;  Laterality: Right;   WRIST FRACTURE SURGERY Left    33 yo.  fell through glass door.    Patient Active Problem List   Diagnosis Date Noted   SVT (supraventricular tachycardia) (HCC) 05/16/2023   Anxiety    Dysrhythmia    Heart murmur    Chronic right shoulder pain 03/16/2023   Post-traumatic  stress disorder, chronic 02/22/2023   Other insomnia 10/14/2022   Routine medical exam 09/18/2021   Need for DTaP vaccine 09/18/2021   GAD (generalized anxiety disorder) 08/07/2021   Encounter for other contraceptive management 08/07/2021   Moderate recurrent major depression (HCC) 08/07/2021   Palpitation 11/10/2020   Achilles bursitis of right lower extremity 03/14/2017   Recurrent major depression-severe (HCC) 03/14/2017    PCP: Blane Ohara, MD  REFERRING PROVIDER: Jene Every, MD  REFERRING DIAG: Encounter for other specified surgical aftercare   THERAPY DIAG:  Chronic right shoulder pain  Muscle weakness (generalized)  Rationale for Evaluation and Treatment: Rehabilitation  ONSET DATE: 03/28/2023  SUBJECTIVE:                           SUBJECTIVE STATEMENT: Patient reports she is feeling good after taking a week off from PT. Saw the Dr. and he is happy with her ROM and progress. Has felt tight but not in pain since her appointment last week. She has been cleared for returning to light weighted exercise.   Hand dominance: Ambidextrous  PERTINENT HISTORY: Right shoulder arthroscopy 03/28/2023  PAIN:  Are you having pain? Yes:  NPRS scale: - /10  Pain location: Right shoulder Pain description: Tight Aggravating factors: Reaching overhead or behind back Relieving factors: Heat, medication  PRECAUTIONS: None  PATIENT GOALS: Pain  relief, be able to use the right arm, return to prior level of function    OBJECTIVE:  Note: Objective measures were completed at Evaluation unless otherwise noted. PATIENT SURVEYS:  FOTO 51% functional status  POSTURE: Rounded shoulder posture, she keeps right arm guarded by side  UPPER EXTREMITY ROM:   Active ROM Right eval Left eval Right 04/27/2023 Right 05/08/2023  Shoulder flexion 90 170 123 140  Shoulder extension 40 60    Shoulder abduction 70 180  135  Shoulder adduction      Shoulder internal rotation Sacrum T6   L5  Shoulder external rotation 65 / occiput 85 / T4    Elbow flexion      Elbow extension      Wrist flexion      Wrist extension      Wrist ulnar deviation      Wrist radial deviation      Wrist pronation      Wrist supination      (Blank rows = not tested)  Passive ROM Right eval Left eval  Shoulder flexion 150   Shoulder extension    Shoulder abduction 120   Shoulder adduction    Shoulder internal rotation 50 @ 90   Shoulder external rotation 70 @ 0 and 90   Elbow flexion    Elbow extension    Wrist flexion    Wrist extension    Wrist ulnar deviation    Wrist radial deviation    Wrist pronation    Wrist supination    (Blank rows = not tested)  UPPER EXTREMITY MMT:  MMT Right eval Left eval  Shoulder flexion    Shoulder extension    Shoulder abduction    Shoulder adduction    Shoulder internal rotation    Shoulder external rotation    Middle trapezius    Lower trapezius    Elbow flexion    Elbow extension    Wrist flexion    Wrist extension    Wrist ulnar deviation    Wrist radial deviation    Wrist pronation    Wrist supination    Grip strength (lbs)    (Blank rows = not tested)  Strength not assessed at eval  JOINT MOBILITY TESTING:  Hypomobility of right GHJ  PALPATION:  Generalized tenderness of right shoulder                                                                                                                             TREATMENT DATE:  DATE: 05/17/2023  Therapeutic Exercise: Incline (60 deg) shoulder flexion with #1 x10 Incline (60 deg) shoulder flexion with #2 2x10  Incline (60 deg) shoulder abduction #1 2x10  Banded walkouts YTB 2x8  Supine serratus punch with #1 2x10 Manual:  STM to right deltoid and posterior cuff region  Right GHJ mobs primarily inferior and posterior at various ranges  Right shoulder PROM    DATE: 05/08/2023 Therapeutic Exercise: Incline (  45 deg) shoulder flexion with 1# x 20 Supine serratus punch  with 1# x 20 Prone row with 1# x 20 Prone shoulder extension with 1# x 20 Prone horizontal abduction x 20 Manual: STM right deltoid and posterior cuff region Right GHJ mobs primarily inferior and posterior at various ranges of elevation Right shoulder PROM Prone thoracic thrust manipulation  DATE: 05/07/2023 Therapeutic Exercise: UBE L1 x 4 min (fwd/bwd) while taking subjective Supine shoulder flexion with 1# 2 x 15 Supine serratus punch with 1# 2 x 15 Supine shoulder flexion > 90 with yellow band 2 x 10 Sidelying ER with 1# 2 x 15 Manual: Skilled palpation and monitoring of muscle tension while performing TPDN STM right middle and anterior deltoid Right GHJ mobs primarily inferior and posterior at various ranges of elevation Right shoulder PROM  Trigger Point Dry Needling  Initial Treatment: Pt instructed on Dry Needling rational, procedures, and possible side effects. Pt instructed to expect mild to moderate muscle soreness later in the day and/or into the next day.  Pt instructed in methods to reduce muscle soreness. Pt instructed to continue prescribed HEP. Because Dry Needling was performed over or adjacent to a lung field, pt was educated on S/S of pneumothorax and to seek immediate medical attention should they occur.  Patient was educated on signs and symptoms of infection and other risk factors and advised to seek medical attention should they occur.  Patient verbalized understanding of these instructions and education.   Patient Verbal Consent Given: Yes Education Handout Provided: Previously Provided Muscles Treated: Right anterior and middle deltoid Electrical Stimulation Performed: Yes, Parameters: Milli, intensity and frequency adjusted throughout treatment for patient tolerance Treatment Response/Outcome: Twitch response, patient report improvement in symptoms  [facility] Adult PT Treatment:                                                DATE: 05/03/2023 Therapeutic  Exercise: Supine shoulder flexion 2 x 10 Sidelying shoulder flexion x 10 Sidelying abduction x 10 Sidelying ER x 10 Manual: STM right middle and anterior deltoid Right GHJ mobs primarily inferior and posterior at various ranges of elevation Right shoulder PROM  [facility] Adult PT Treatment:                                                DATE: 05/01/2023 Therapeutic Exercise: Supine shoulder ER/IR AROM x 10 Child's pose stretch for shoulder flexion 10 x 5 seconds Supine shoulder flexion holding pilates ring x 10 Manual: Right GHJ grade I mobs A/P and distractive for pain relief Passive posterior glide/retraction  PATIENT EDUCATION: Education details: HEP Person educated: Patient Education method: Programmer, multimedia, Facilities manager, Actor cues, Verbal cues Education comprehension: verbalized understanding, returned demonstration, verbal cues required, tactile cues required, and needs further education  HOME EXERCISE PROGRAM: Access Code: [email or web address]   ASSESSMENT: CLINICAL IMPRESSION: Patient tolerated therapy well with no adverse effects. She continues to progress in her ROM and strength in the right shoulder. Today, we continued to progress shoulder elevation increasing incline and weight. Additionally, therapy progressed for ER strength introducing new isometric exercises. Patient continues to need reminders to not shoulder shrug or have the shoulder anteriorly translate when fatigued. No increase in pain during session. No  changes made to HEP during this visit. Discussed POC with patient after seeing her doctor and we will continue seeing patient twice a week for at least the next month.   EVAL: Patient is a 33 y.o. female who was seen today for physical therapy evaluation and treatment for right shoulder pain following arthroscopy on 03/28/2023. Currently she demonstrates limitations with her right shoulder motion, strength not assessed this visit due to pain. She was provided exercises to  initiate shoulder mobility and light rotator cuff work with good tolerance.   OBJECTIVE IMPAIRMENTS: decreased activity tolerance, decreased ROM, decreased strength, impaired UE functional use, postural dysfunction, and pain.   ACTIVITY LIMITATIONS: carrying, lifting, sleeping, bathing, dressing, reach over head, and hygiene/grooming  PARTICIPATION LIMITATIONS: meal prep, cleaning, laundry, shopping, community activity, and occupation  PERSONAL FACTORS: Past/current experiences and Time since onset of injury/illness/exacerbation are also affecting patient's functional outcome.    GOALS: Goals reviewed with patient? Yes  SHORT TERM GOALS: Target date: 05/17/2023  Patient will be I with initial HEP in order to progress with therapy. Baseline: HEP provided at eval 05/08/2023: independent with her initial HEP Goal status: MET  2.  Patient will report right shoulder pain </= 4/10 in order to reduce functional limitations Baseline: 7/10 05/08/2023: denies pain but reports tightness Goal status: MET  3.  Patient will demonstrate right shoulder active elevation >/= 120 deg in order to improve self care ability Baseline: 90 deg 05/08/2023: 140 deg Goal status: MET  LONG TERM GOALS: Target date: 06/14/2023  Patient will be I with final HEP to maintain progress from PT.  Baseline: HEP provided at eval Goal status: INITIAL  2.  Patient will report >/= 65% status on FOTO to indicate improved functional ability. Baseline: 51% functional status Goal status: INITIAL  3.  Patient will demonstrate right shoulder active elevation >/= 160 deg to normalize overhead reach  Baseline: 90 deg Goal status: INITIAL  4.  Patient will be able to reach behind back to T10 in order to improve dressing and bathing ability Baseline: Sacrum Goal status: INITIAL  5. Patient will demonstrate right rotator cuff strength 5/5 MMT in order to improve lifting and carrying with the right arm  Basline: not assessed  at eval  Goal status: INITIAL   PLAN: PT FREQUENCY: 1-2x/week  PT DURATION: 8 weeks  PLANNED INTERVENTIONS: 97164- PT Re-evaluation, 97110-Therapeutic exercises, 97530- Therapeutic activity, 97112- Neuromuscular re-education, 97535- Self Care, [id number]- Manual therapy, [id number]- Aquatic Therapy, 97014- Electrical stimulation (unattended), [id number]- Electrical stimulation (manual), 97016- Vasopneumatic device, 97033- Ionotophoresis 4mg /ml Dexamethasone, Patient/Family education, Balance training, Taping, Dry Needling, Joint mobilization, Joint manipulation, Spinal manipulation, Spinal mobilization, Cryotherapy, and Moist heat  PLAN FOR NEXT SESSION: Review HEP and progress PRN, manual/mobs/PROM for right shoulder motion, progress AAROM>AROM as tolerated, progress right shoulder strength and postural control as tolerated. Begin more functional activities.   Erin Hearing, Student-PT 05/17/2023, 9:03 AM

## 2023-05-23 ENCOUNTER — Other Ambulatory Visit: Payer: Self-pay

## 2023-05-23 ENCOUNTER — Ambulatory Visit: Payer: PRIVATE HEALTH INSURANCE | Attending: Specialist | Admitting: Physical Therapy

## 2023-05-23 ENCOUNTER — Encounter: Payer: Self-pay | Admitting: Physical Therapy

## 2023-05-23 DIAGNOSIS — G8929 Other chronic pain: Secondary | ICD-10-CM | POA: Diagnosis present

## 2023-05-23 DIAGNOSIS — M25511 Pain in right shoulder: Secondary | ICD-10-CM | POA: Insufficient documentation

## 2023-05-23 DIAGNOSIS — M6281 Muscle weakness (generalized): Secondary | ICD-10-CM | POA: Insufficient documentation

## 2023-05-23 NOTE — Therapy (Signed)
 OUTPATIENT PHYSICAL THERAPY TREATMENT   Patient Name: Brenda Mcmahon MRN: [id number] DOB:03/25/1991, 33 y.o., female Today's Date: 05/23/2023   END OF SESSION:  PT End of Session - 05/23/23 0804     Visit Number 8    Number of Visits 17    Date for PT Re-Evaluation 06/14/23    Authorization Type Worker's Comp    Authorization Time Period complete by 06/12/2023    Authorization - Visit Number 2    Authorization - Number of Visits 8    PT Start Time 0800    PT Stop Time 0845    PT Time Calculation (min) 45 min    Activity Tolerance Patient tolerated treatment well    Behavior During Therapy [facility] for tasks assessed/performed               Past Medical History:  Diagnosis Date   Achilles bursitis of right lower extremity 03/14/2017   Anxiety    Chronic right shoulder pain 03/16/2023   Depression 08/07/2021   Dysrhythmia    Encounter for other contraceptive management 08/07/2021   GAD (generalized anxiety disorder) 08/07/2021   Heart murmur    Moderate recurrent major depression (HCC) 08/07/2021   Need for DTaP vaccine 09/18/2021   Other insomnia 10/14/2022   Palpitation    Post-traumatic stress disorder, chronic 02/22/2023   Recurrent major depression-severe (HCC) 03/14/2017   Routine medical exam 09/18/2021   Past Surgical History:  Procedure Laterality Date   FOOT FRACTURE SURGERY Right 2015   also ankle fracture repaired.    SHOULDER ARTHROSCOPY WITH ROTATOR CUFF REPAIR AND SUBACROMIAL DECOMPRESSION Right 03/28/2023   Procedure: SHOULDER ARTHROSCOPY  AND SUBACROMIAL DECOMPRESSION DEBRIDEMENT;  Surgeon: Duwayne Purchase, MD;  Location: [facility];  Service: Orthopedics;  Laterality: Right;   WRIST FRACTURE SURGERY Left    33 yo.  fell through glass door.    Patient Active Problem List   Diagnosis Date Noted   SVT (supraventricular tachycardia) (HCC) 05/16/2023   Anxiety    Dysrhythmia    Heart murmur    Chronic right shoulder pain 03/16/2023   Post-traumatic  stress disorder, chronic 02/22/2023   Other insomnia 10/14/2022   Routine medical exam 09/18/2021   Need for DTaP vaccine 09/18/2021   GAD (generalized anxiety disorder) 08/07/2021   Encounter for other contraceptive management 08/07/2021   Moderate recurrent major depression (HCC) 08/07/2021   Palpitation 11/10/2020   Achilles bursitis of right lower extremity 03/14/2017   Recurrent major depression-severe (HCC) 03/14/2017    PCP: Sherre Clapper, MD  REFERRING PROVIDER: Duwayne Purchase, MD  REFERRING DIAG: Encounter for other specified surgical aftercare   THERAPY DIAG:  Chronic right shoulder pain  Muscle weakness (generalized)  Rationale for Evaluation and Treatment: Rehabilitation  ONSET DATE: 03/28/2023  SUBJECTIVE:                           SUBJECTIVE STATEMENT: Patient reports no pain but she feels like she has a knot in the side of her shoulder and by the shoulder blade.  Hand dominance: Ambidextrous  PERTINENT HISTORY: Right shoulder arthroscopy 03/28/2023  PAIN:  Are you having pain? Yes:  NPRS scale: 0 /10  Pain location: Right shoulder Pain description: Tight Aggravating factors: Reaching overhead or behind back Relieving factors: Heat, medication  PRECAUTIONS: None  PATIENT GOALS: Pain relief, be able to use the right arm, return to prior level of function    OBJECTIVE:  Note: Objective measures were completed  at Evaluation unless otherwise noted. PATIENT SURVEYS:  FOTO 51% functional status  POSTURE: Rounded shoulder posture, she keeps right arm guarded by side  UPPER EXTREMITY ROM:   Active ROM Right eval Left eval Right 04/27/2023 Right 05/08/2023  Shoulder flexion 90 170 123 140  Shoulder extension 40 60    Shoulder abduction 70 180  135  Shoulder adduction      Shoulder internal rotation Sacrum T6  L5  Shoulder external rotation 65 / occiput 85 / T4    Elbow flexion      Elbow extension      Wrist flexion      Wrist extension       Wrist ulnar deviation      Wrist radial deviation      Wrist pronation      Wrist supination      (Blank rows = not tested)  Passive ROM Right eval Left eval  Shoulder flexion 150   Shoulder extension    Shoulder abduction 120   Shoulder adduction    Shoulder internal rotation 50 @ 90   Shoulder external rotation 70 @ 0 and 90   Elbow flexion    Elbow extension    Wrist flexion    Wrist extension    Wrist ulnar deviation    Wrist radial deviation    Wrist pronation    Wrist supination    (Blank rows = not tested)  UPPER EXTREMITY MMT:  MMT Right eval Left eval  Shoulder flexion    Shoulder extension    Shoulder abduction    Shoulder adduction    Shoulder internal rotation    Shoulder external rotation    Middle trapezius    Lower trapezius    Elbow flexion    Elbow extension    Wrist flexion    Wrist extension    Wrist ulnar deviation    Wrist radial deviation    Wrist pronation    Wrist supination    Grip strength (lbs)    (Blank rows = not tested)  Strength not assessed at eval  JOINT MOBILITY TESTING:  Hypomobility of right GHJ  PALPATION:  Generalized tenderness of right shoulder                                                                                                                             TREATMENT DATE:  [facility] Adult PT Treatment:                                                DATE: 05/23/2023 Supine right shoulder GHJ mobs at various ranges with focus on end range elevation mobs primarily inferior and posterior directions Prone thoracic extension thrust manipulation Prone scap retraction holding 5# 10 x 5 sec Supine serratus punch holding 5# 2 x 10 right Incline shoulder  flexion full range holding 2# 2 x 10 right Banded ER at side with red 2 x 15 right  Trigger Point Dry Needling  Subsequent Treatment: Instructions provided previously at initial dry needling treatment.  Instructions reviewed, if requested by the patient, prior to  subsequent dry needling treatment.   Patient Verbal Consent Given: Yes Education Handout Provided: Previously Provided Muscles Treated: right rhomboid, mid trap, subscap, middle deltoid Electrical Stimulation Performed: No Treatment Response/Outcome: Twitch response, patient report improvement of symptoms   DATE: 05/17/2023  Therapeutic Exercise: Incline (60 deg) shoulder flexion with #1 x10 Incline (60 deg) shoulder flexion with #2 2x10  Incline (60 deg) shoulder abduction #1 2x10  Banded walkouts YTB 2x8  Supine serratus punch with #1 2x10 Manual:  STM to right deltoid and posterior cuff region  Right GHJ mobs primarily inferior and posterior at various ranges  Right shoulder PROM   DATE: 05/08/2023 Therapeutic Exercise: Incline (45 deg) shoulder flexion with 1# x 20 Supine serratus punch with 1# x 20 Prone row with 1# x 20 Prone shoulder extension with 1# x 20 Prone horizontal abduction x 20 Manual: STM right deltoid and posterior cuff region Right GHJ mobs primarily inferior and posterior at various ranges of elevation Right shoulder PROM Prone thoracic thrust manipulation  PATIENT EDUCATION: Education details: HEP Person educated: Patient Education method: Programmer, Multimedia, Demonstration, Actor cues, Verbal cues Education comprehension: verbalized understanding, returned demonstration, verbal cues required, tactile cues required, and needs further education  HOME EXERCISE PROGRAM: Access Code: [email or web address]   ASSESSMENT: CLINICAL IMPRESSION: Patient tolerated therapy well with no adverse effects. Therapy incorporated TPDN for the right scapular and deltoid region with multiple twitch responses and patient reporting improvement in symptoms. Therapy focused on improving scapular control for posture and improving her overhead reach with improved scapular upward rotation and rotator cuff strengthening. Manual provided primarily to improve her end range mobility as she does remain  limited with end range elevation. She did well with inclined elevation without any signs of shrug. No changes made to her HEP this visit. Patient would benefit from continued skilled PT to progress her mobility and strength in order to reduce pain and maximize functional ability.   EVAL: Patient is a 33 y.o. female who was seen today for physical therapy evaluation and treatment for right shoulder pain following arthroscopy on 03/28/2023. Currently she demonstrates limitations with her right shoulder motion, strength not assessed this visit due to pain. She was provided exercises to initiate shoulder mobility and light rotator cuff work with good tolerance.   OBJECTIVE IMPAIRMENTS: decreased activity tolerance, decreased ROM, decreased strength, impaired UE functional use, postural dysfunction, and pain.   ACTIVITY LIMITATIONS: carrying, lifting, sleeping, bathing, dressing, reach over head, and hygiene/grooming  PARTICIPATION LIMITATIONS: meal prep, cleaning, laundry, shopping, community activity, and occupation  PERSONAL FACTORS: Past/current experiences and Time since onset of injury/illness/exacerbation are also affecting patient's functional outcome.    GOALS: Goals reviewed with patient? Yes  SHORT TERM GOALS: Target date: 05/17/2023  Patient will be I with initial HEP in order to progress with therapy. Baseline: HEP provided at eval 05/08/2023: independent with her initial HEP Goal status: MET  2.  Patient will report right shoulder pain </= 4/10 in order to reduce functional limitations Baseline: 7/10 05/08/2023: denies pain but reports tightness Goal status: MET  3.  Patient will demonstrate right shoulder active elevation >/= 120 deg in order to improve self care ability Baseline: 90 deg 05/08/2023: 140 deg Goal status: MET  LONG TERM  GOALS: Target date: 06/14/2023  Patient will be I with final HEP to maintain progress from PT.  Baseline: HEP provided at eval Goal status:  INITIAL  2.  Patient will report >/= 65% status on FOTO to indicate improved functional ability. Baseline: 51% functional status Goal status: INITIAL  3.  Patient will demonstrate right shoulder active elevation >/= 160 deg to normalize overhead reach  Baseline: 90 deg Goal status: INITIAL  4.  Patient will be able to reach behind back to T10 in order to improve dressing and bathing ability Baseline: Sacrum Goal status: INITIAL  5. Patient will demonstrate right rotator cuff strength 5/5 MMT in order to improve lifting and carrying with the right arm  Basline: not assessed at eval  Goal status: INITIAL   PLAN: PT FREQUENCY: 1-2x/week  PT DURATION: 8 weeks  PLANNED INTERVENTIONS: 97164- PT Re-evaluation, 97110-Therapeutic exercises, 97530- Therapeutic activity, 97112- Neuromuscular re-education, 97535- Self Care, 02859- Manual therapy, [id number]- Aquatic Therapy, 97014- Electrical stimulation (unattended), [id number]- Electrical stimulation (manual), 97016- Vasopneumatic device, 97033- Ionotophoresis 4mg /ml Dexamethasone , Patient/Family education, Balance training, Taping, Dry Needling, Joint mobilization, Joint manipulation, Spinal manipulation, Spinal mobilization, Cryotherapy, and Moist heat  PLAN FOR NEXT SESSION: Review HEP and progress PRN, manual/mobs/PROM for right shoulder motion, AROM and progress right shoulder strength and postural control as tolerated. Begin more functional activities.   Elaine Daring, PT, DPT, LAT, ATC 05/23/23  10:10 AM Phone: [phone] Fax: [phone]

## 2023-05-24 NOTE — Therapy (Addendum)
 OUTPATIENT PHYSICAL THERAPY TREATMENT   Patient Name: Brenda Mcmahon MRN: [id number] DOB:02-08-1991, 33 y.o., female Today's Date: 05/25/2023   END OF SESSION:  PT End of Session - 05/25/23 0907     Visit Number 9    Number of Visits 17    Date for PT Re-Evaluation 06/14/23    Authorization Type Worker's Comp    Authorization Time Period complete by 06/12/2023    Authorization - Visit Number 3    Authorization - Number of Visits 8    PT Start Time 0802    PT Stop Time 0847    PT Time Calculation (min) 45 min    Activity Tolerance Patient tolerated treatment well    Behavior During Therapy [facility] for tasks assessed/performed             Past Medical History:  Diagnosis Date   Achilles bursitis of right lower extremity 03/14/2017   Anxiety    Chronic right shoulder pain 03/16/2023   Depression 08/07/2021   Dysrhythmia    Encounter for other contraceptive management 08/07/2021   GAD (generalized anxiety disorder) 08/07/2021   Heart murmur    Moderate recurrent major depression (HCC) 08/07/2021   Need for DTaP vaccine 09/18/2021   Other insomnia 10/14/2022   Palpitation    Post-traumatic stress disorder, chronic 02/22/2023   Recurrent major depression-severe (HCC) 03/14/2017   Routine medical exam 09/18/2021   Past Surgical History:  Procedure Laterality Date   FOOT FRACTURE SURGERY Right 2015   also ankle fracture repaired.    SHOULDER ARTHROSCOPY WITH ROTATOR CUFF REPAIR AND SUBACROMIAL DECOMPRESSION Right 03/28/2023   Procedure: SHOULDER ARTHROSCOPY  AND SUBACROMIAL DECOMPRESSION DEBRIDEMENT;  Surgeon: Duwayne Purchase, MD;  Location: [facility];  Service: Orthopedics;  Laterality: Right;   WRIST FRACTURE SURGERY Left    32 yo.  fell through glass door.    Patient Active Problem List   Diagnosis Date Noted   SVT (supraventricular tachycardia) (HCC) 05/16/2023   Anxiety    Dysrhythmia    Heart murmur    Chronic right shoulder pain 03/16/2023   Post-traumatic  stress disorder, chronic 02/22/2023   Other insomnia 10/14/2022   Routine medical exam 09/18/2021   Need for DTaP vaccine 09/18/2021   GAD (generalized anxiety disorder) 08/07/2021   Encounter for other contraceptive management 08/07/2021   Moderate recurrent major depression (HCC) 08/07/2021   Palpitation 11/10/2020   Achilles bursitis of right lower extremity 03/14/2017   Recurrent major depression-severe (HCC) 03/14/2017    PCP: Sherre Clapper, MD  REFERRING PROVIDER: Duwayne Purchase, MD  REFERRING DIAG: Encounter for other specified surgical aftercare   THERAPY DIAG:  Muscle weakness (generalized)  Chronic right shoulder pain  Rationale for Evaluation and Treatment: Rehabilitation  ONSET DATE: 03/28/2023   SUBJECTIVE:                           SUBJECTIVE STATEMENT: Patient reports that she feels good after last session. The muscle tightness of her deltoid is currently more bothersome then her shoulder pain. She was able to wash her hair without pain last night.   Hand dominance: Ambidextrous  PERTINENT HISTORY: Right shoulder arthroscopy 03/28/2023  PAIN:  Are you having pain? Yes:  NPRS scale: 0 /10  Pain location: Right shoulder Pain description: Tight Aggravating factors: Reaching overhead or behind back Relieving factors: Heat, medication  PRECAUTIONS: None  PATIENT GOALS: Pain relief, be able to use the right arm, return to prior level of  function    OBJECTIVE:  Note: Objective measures were completed at Evaluation unless otherwise noted. PATIENT SURVEYS:  FOTO 51% functional status  POSTURE: Rounded shoulder posture, she keeps right arm guarded by side  UPPER EXTREMITY ROM:   Active ROM Right eval Left eval Right 04/27/2023 Right 05/08/2023  Shoulder flexion 90 170 123 140  Shoulder extension 40 60    Shoulder abduction 70 180  135  Shoulder adduction      Shoulder internal rotation Sacrum T6  L5  Shoulder external rotation 65 / occiput 85 /  T4    Elbow flexion      Elbow extension      Wrist flexion      Wrist extension      Wrist ulnar deviation      Wrist radial deviation      Wrist pronation      Wrist supination      (Blank rows = not tested)  Passive ROM Right eval Left eval  Shoulder flexion 150   Shoulder extension    Shoulder abduction 120   Shoulder adduction    Shoulder internal rotation 50 @ 90   Shoulder external rotation 70 @ 0 and 90   Elbow flexion    Elbow extension    Wrist flexion    Wrist extension    Wrist ulnar deviation    Wrist radial deviation    Wrist pronation    Wrist supination    (Blank rows = not tested)  UPPER EXTREMITY MMT:  MMT Right eval Left eval  Shoulder flexion    Shoulder extension    Shoulder abduction    Shoulder adduction    Shoulder internal rotation    Shoulder external rotation    Middle trapezius    Lower trapezius    Elbow flexion    Elbow extension    Wrist flexion    Wrist extension    Wrist ulnar deviation    Wrist radial deviation    Wrist pronation    Wrist supination    Grip strength (lbs)    (Blank rows = not tested)  Strength not assessed at eval  JOINT MOBILITY TESTING:  Hypomobility of right GHJ  PALPATION:  Generalized tenderness of right shoulder                                                                                                                             TREATMENT: 05/25/2023 UBE x3 min (fwd/bwd)  Inclined shoulder flexion full range holding 2# 2x10 right  Sidelying abduction #2 x15  Banded rows BlueTB x10 Supine serratus punch holding #5 x15 Banded ER RTB 2x20  Manual Therapy:  STM to deltoid and bicep Seated thoracic extension manipulation    [facility] Adult PT Treatment:  DATE: 05/23/2023 Supine right shoulder GHJ mobs at various ranges with focus on end range elevation mobs primarily inferior and posterior directions Prone thoracic extension thrust  manipulation Prone scap retraction holding 5# 10 x 5 sec Supine serratus punch holding 5# 2 x 10 right Incline shoulder flexion full range holding 2# 2 x 10 right Banded ER at side with red 2 x 15 right  Trigger Point Dry Needling  Subsequent Treatment: Instructions provided previously at initial dry needling treatment.  Instructions reviewed, if requested by the patient, prior to subsequent dry needling treatment.   Patient Verbal Consent Given: Yes Education Handout Provided: Previously Provided Muscles Treated: right rhomboid, mid trap, subscap, middle deltoid Electrical Stimulation Performed: No Treatment Response/Outcome: Twitch response, patient report improvement of symptoms  DATE: 05/17/2023  Therapeutic Exercise: Incline (60 deg) shoulder flexion with #1 x10 Incline (60 deg) shoulder flexion with #2 2x10  Incline (60 deg) shoulder abduction #1 2x10  Banded walkouts YTB 2x8  Supine serratus punch with #1 2x10 Manual:  STM to right deltoid and posterior cuff region  Right GHJ mobs primarily inferior and posterior at various ranges  Right shoulder PROM   PATIENT EDUCATION: Education details: HEP update Person educated: Patient Education method: Explanation, Demonstration, Tactile cues, Verbal cues Education comprehension: verbalized understanding, returned demonstration, verbal cues required, tactile cues required, and needs further education  HOME EXERCISE PROGRAM: Access Code: [email or web address]   ASSESSMENT: CLINICAL IMPRESSION: Patient tolerated therapy well with no adverse effects. Manual therapy done in order to decrease pain in deltoid and bicep region. Twitch response noted during STM. Therapy today worked on scapular control and flexion/abduction shoulder strength and functionality. Patient continues to make progress in her strength being able to complete inclined shoulder flexion at ~80 degrees of incline and sidelying abduction to almost full range using #2. Continues  to work on shrug awareness during fatigue. Next session will continue working on strengthening overhead reach. Patient reported she was doing banded ER at home, so that was added to her HEP today and she was sent home with a red theraband. Patient would benefit from continued skilled PT to continue progressing mobility and strength to reduce pain levels and maximize functional ability.   EVAL: Patient is a 33 y.o. female who was seen today for physical therapy evaluation and treatment for right shoulder pain following arthroscopy on 03/28/2023. Currently she demonstrates limitations with her right shoulder motion, strength not assessed this visit due to pain. She was provided exercises to initiate shoulder mobility and light rotator cuff work with good tolerance.   OBJECTIVE IMPAIRMENTS: decreased activity tolerance, decreased ROM, decreased strength, impaired UE functional use, postural dysfunction, and pain.   ACTIVITY LIMITATIONS: carrying, lifting, sleeping, bathing, dressing, reach over head, and hygiene/grooming  PARTICIPATION LIMITATIONS: meal prep, cleaning, laundry, shopping, community activity, and occupation  PERSONAL FACTORS: Past/current experiences and Time since onset of injury/illness/exacerbation are also affecting patient's functional outcome.    GOALS: Goals reviewed with patient? Yes  SHORT TERM GOALS: Target date: 05/17/2023  Patient will be I with initial HEP in order to progress with therapy. Baseline: HEP provided at eval 05/08/2023: independent with her initial HEP Goal status: MET  2.  Patient will report right shoulder pain </= 4/10 in order to reduce functional limitations Baseline: 7/10 05/08/2023: denies pain but reports tightness Goal status: MET  3.  Patient will demonstrate right shoulder active elevation >/= 120 deg in order to improve self care ability Baseline: 90 deg 05/08/2023: 140 deg Goal status: MET  LONG TERM GOALS: Target date:  06/14/2023  Patient will be I with final HEP to maintain progress from PT.  Baseline: HEP provided at eval Goal status: INITIAL  2.  Patient will report >/= 65% status on FOTO to indicate improved functional ability. Baseline: 51% functional status Goal status: INITIAL  3.  Patient will demonstrate right shoulder active elevation >/= 160 deg to normalize overhead reach  Baseline: 90 deg Goal status: INITIAL  4.  Patient will be able to reach behind back to T10 in order to improve dressing and bathing ability Baseline: Sacrum Goal status: INITIAL  5. Patient will demonstrate right rotator cuff strength 5/5 MMT in order to improve lifting and carrying with the right arm  Basline: not assessed at eval  Goal status: INITIAL   PLAN: PT FREQUENCY: 1-2x/week  PT DURATION: 8 weeks  PLANNED INTERVENTIONS: 97164- PT Re-evaluation, 97110-Therapeutic exercises, 97530- Therapeutic activity, 97112- Neuromuscular re-education, 97535- Self Care, 02859- Manual therapy, [id number]- Aquatic Therapy, 97014- Electrical stimulation (unattended), [id number]- Electrical stimulation (manual), 97016- Vasopneumatic device, [id number]- Ionotophoresis 4mg /ml Dexamethasone , Patient/Family education, Balance training, Taping, Dry Needling, Joint mobilization, Joint manipulation, Spinal manipulation, Spinal mobilization, Cryotherapy, and Moist heat  PLAN FOR NEXT SESSION: Review HEP and progress PRN, manual/mobs/PROM for right shoulder motion, AROM and progress right shoulder strength and postural control as tolerated. Begin more functional activities.   Kent Hummer, Student-PT 05/25/2023, 9:59 AM

## 2023-05-25 ENCOUNTER — Encounter: Payer: Self-pay | Admitting: Physical Therapy

## 2023-05-25 ENCOUNTER — Ambulatory Visit: Payer: PRIVATE HEALTH INSURANCE | Attending: Specialist | Admitting: Physical Therapy

## 2023-05-25 ENCOUNTER — Other Ambulatory Visit: Payer: Self-pay

## 2023-05-25 DIAGNOSIS — M6281 Muscle weakness (generalized): Secondary | ICD-10-CM | POA: Insufficient documentation

## 2023-05-25 DIAGNOSIS — G8929 Other chronic pain: Secondary | ICD-10-CM | POA: Insufficient documentation

## 2023-05-25 DIAGNOSIS — M25511 Pain in right shoulder: Secondary | ICD-10-CM | POA: Insufficient documentation

## 2023-05-28 ENCOUNTER — Ambulatory Visit: Payer: PRIVATE HEALTH INSURANCE | Admitting: Physical Therapy

## 2023-05-28 ENCOUNTER — Encounter: Payer: Self-pay | Admitting: Physical Therapy

## 2023-05-28 ENCOUNTER — Other Ambulatory Visit: Payer: Self-pay

## 2023-05-28 DIAGNOSIS — M6281 Muscle weakness (generalized): Secondary | ICD-10-CM | POA: Diagnosis not present

## 2023-05-28 DIAGNOSIS — G8929 Other chronic pain: Secondary | ICD-10-CM

## 2023-05-28 NOTE — Therapy (Addendum)
 OUTPATIENT PHYSICAL THERAPY TREATMENT   Patient Name: Brenda Mcmahon MRN: [id number] DOB:1991-03-09, 33 y.o., female Today's Date: 05/28/2023  END OF SESSION:  PT End of Session - 05/28/23 0914     Visit Number 10    Number of Visits 17    Date for PT Re-Evaluation 06/14/23    Authorization Type Worker's Comp    Authorization Time Period complete by 06/12/2023    Authorization - Visit Number 4    Authorization - Number of Visits 8    PT Start Time 0800    PT Stop Time 0900    PT Time Calculation (min) 60 min    Activity Tolerance Patient tolerated treatment well    Behavior During Therapy [facility] for tasks assessed/performed             Past Medical History:  Diagnosis Date   Achilles bursitis of right lower extremity 03/14/2017   Anxiety    Chronic right shoulder pain 03/16/2023   Depression 08/07/2021   Dysrhythmia    Encounter for other contraceptive management 08/07/2021   GAD (generalized anxiety disorder) 08/07/2021   Heart murmur    Moderate recurrent major depression (HCC) 08/07/2021   Need for DTaP vaccine 09/18/2021   Other insomnia 10/14/2022   Palpitation    Post-traumatic stress disorder, chronic 02/22/2023   Recurrent major depression-severe (HCC) 03/14/2017   Routine medical exam 09/18/2021   Past Surgical History:  Procedure Laterality Date   FOOT FRACTURE SURGERY Right 2015   also ankle fracture repaired.    SHOULDER ARTHROSCOPY WITH ROTATOR CUFF REPAIR AND SUBACROMIAL DECOMPRESSION Right 03/28/2023   Procedure: SHOULDER ARTHROSCOPY  AND SUBACROMIAL DECOMPRESSION DEBRIDEMENT;  Surgeon: Orvan Blanch, MD;  Location: [facility];  Service: Orthopedics;  Laterality: Right;   WRIST FRACTURE SURGERY Left    33 yo.  fell through glass door.    Patient Active Problem List   Diagnosis Date Noted   SVT (supraventricular tachycardia) (HCC) 05/16/2023   Anxiety    Dysrhythmia    Heart murmur    Chronic right shoulder pain 03/16/2023   Post-traumatic  stress disorder, chronic 02/22/2023   Other insomnia 10/14/2022   Routine medical exam 09/18/2021   Need for DTaP vaccine 09/18/2021   GAD (generalized anxiety disorder) 08/07/2021   Encounter for other contraceptive management 08/07/2021   Moderate recurrent major depression (HCC) 08/07/2021   Palpitation 11/10/2020   Achilles bursitis of right lower extremity 03/14/2017   Recurrent major depression-severe (HCC) 03/14/2017    PCP: Mercy Stall, MD  REFERRING PROVIDER: Orvan Blanch, MD  REFERRING DIAG: Encounter for other specified surgical aftercare   THERAPY DIAG:  Chronic right shoulder pain  Muscle weakness (generalized)  Rationale for Evaluation and Treatment: Rehabilitation  ONSET DATE: 03/28/2023   SUBJECTIVE:                           SUBJECTIVE STATEMENT: Patient reports that she feels good after the weekend. She reports that she did a lot this weekend and did not feel an increase in pain. She also slept on her right shoulder with no pain. Still feels tightness in the bicep tendon. Is compliant with HEP at home.   Hand dominance: Ambidextrous  PERTINENT HISTORY: Right shoulder arthroscopy 03/28/2023  PAIN:  Are you having pain? Yes:  NPRS scale: 0 /10  Pain location: Right shoulder Pain description: Tight Aggravating factors: Reaching overhead or behind back Relieving factors: Heat, medication  PRECAUTIONS: None  PATIENT GOALS:  Pain relief, be able to use the right arm, return to prior level of function    OBJECTIVE:  Note: Objective measures were completed at Evaluation unless otherwise noted. PATIENT SURVEYS:  FOTO 51% functional status  POSTURE: Rounded shoulder posture, she keeps right arm guarded by side  UPPER EXTREMITY ROM:   Active ROM Right eval Left eval Right 04/27/2023 Right 05/08/2023 Right 05/28/2023  Shoulder flexion 90 170 123 140 150   Shoulder extension 40 60     Shoulder abduction 70 180  135 130   Shoulder adduction        Shoulder internal rotation Sacrum T6  L5 L1   Shoulder external rotation 65 / occiput 85 / T4   90 deg/ T6  Elbow flexion       Elbow extension       Wrist flexion       Wrist extension       Wrist ulnar deviation       Wrist radial deviation       Wrist pronation       Wrist supination       (Blank rows = not tested)  Passive ROM Right eval Left eval  Shoulder flexion 150   Shoulder extension    Shoulder abduction 120   Shoulder adduction    Shoulder internal rotation 50 @ 90   Shoulder external rotation 70 @ 0 and 90   Elbow flexion    Elbow extension    Wrist flexion    Wrist extension    Wrist ulnar deviation    Wrist radial deviation    Wrist pronation    Wrist supination    (Blank rows = not tested)  UPPER EXTREMITY MMT:  MMT Right eval Left eval  Shoulder flexion    Shoulder extension    Shoulder abduction    Shoulder adduction    Shoulder internal rotation    Shoulder external rotation    Middle trapezius    Lower trapezius    Elbow flexion    Elbow extension    Wrist flexion    Wrist extension    Wrist ulnar deviation    Wrist radial deviation    Wrist pronation    Wrist supination    Grip strength (lbs)    (Blank rows = not tested)  Strength not assessed at eval  JOINT MOBILITY TESTING:  Hypomobility of right GHJ  PALPATION:  Generalized tenderness of right shoulder                                                                                                             TREATMENT TODAY: [facility] TREATMENT:  05/28/2023 UBE x4 min (fwd/bwd)  Inclined shoulder flexion full range (70 degrees) 2# 2x10 right Sidelying abduction #2 2x15 Sidelying ER #2 2x15  Serratus wall slides 2x10 RTB  Low row #20 2x10  Tricep pull down 2x10 #7 Banded ER YTB 2x10  Bicep curls #4 2x10  Manual Therapy:  STM to deltoid, bicep, and anterior shoulder  Inferior and posterior mobilizations  to right shoulder   [facility] TREATMENT:  05/25/2023 UBE x3 min  (fwd/bwd)  Inclined shoulder flexion full range holding 2# 2x10 right  Sidelying abduction #2 x15  Banded rows BlueTB x10 Supine serratus punch holding #5 x15 Banded ER RTB 2x20  Manual Therapy:  STM to deltoid and bicep Seated thoracic extension manipulation  PATIENT EDUCATION: Education details: HEP update Person educated: Patient Education method: Explanation, Demonstration, Tactile cues, Verbal cues Education comprehension: verbalized understanding, returned demonstration, verbal cues required, tactile cues required, and needs further education  HOME EXERCISE PROGRAM: Access Code: [email or web address]   ASSESSMENT: CLINICAL IMPRESSION: Patient tolerated therapy well with no adverse effects. Patient arrives at the clinic with no reports of pain, which is progress compared to past sessions. Manual therapy done in order to decrease pain in deltoid and bicep region as well as anterior shoulder. Arrived at clinic with 150 degrees of flexion, which shows improvement in ROM. Patient continues to make progress in her strength, completing shoulder flexion at ~70 degrees of incline today with no shoulder shrug. Next session, patient can progress to flexion standing against gravity. Introduced light weight training today with low rows, tricep extension, and bicep curls. Focus on postural control during these exercises/no shoulder shrug. No shrug sign noted today during any therapy intervention. Next session, continue to work on postural control, UE strength, and functional ability of the right arm. Patient would benefit from continued skilled PT to continued progression of mobility and strength of the right arm to maximize functional ability and decrease pain levels.  EVAL: Patient is a 33 y.o. female who was seen today for physical therapy evaluation and treatment for right shoulder pain following arthroscopy on 03/28/2023. Currently she demonstrates limitations with her right shoulder motion, strength not  assessed this visit due to pain. She was provided exercises to initiate shoulder mobility and light rotator cuff work with good tolerance.   OBJECTIVE IMPAIRMENTS: decreased activity tolerance, decreased ROM, decreased strength, impaired UE functional use, postural dysfunction, and pain.   ACTIVITY LIMITATIONS: carrying, lifting, sleeping, bathing, dressing, reach over head, and hygiene/grooming  PARTICIPATION LIMITATIONS: meal prep, cleaning, laundry, shopping, community activity, and occupation  PERSONAL FACTORS: Past/current experiences and Time since onset of injury/illness/exacerbation are also affecting patient's functional outcome.    GOALS: Goals reviewed with patient? Yes  SHORT TERM GOALS: Target date: 05/17/2023  Patient will be I with initial HEP in order to progress with therapy. Baseline: HEP provided at eval 05/08/2023: independent with her initial HEP Goal status: MET  2.  Patient will report right shoulder pain </= 4/10 in order to reduce functional limitations Baseline: 7/10 05/08/2023: denies pain but reports tightness Goal status: MET  3.  Patient will demonstrate right shoulder active elevation >/= 120 deg in order to improve self care ability Baseline: 90 deg 05/08/2023: 140 deg Goal status: MET  LONG TERM GOALS: Target date: 06/14/2023  Patient will be I with final HEP to maintain progress from PT.  Baseline: HEP provided at eval Goal status: INITIAL  2.  Patient will report >/= 65% status on FOTO to indicate improved functional ability. Baseline: 51% functional status Goal status: INITIAL  3.  Patient will demonstrate right shoulder active elevation >/= 160 deg to normalize overhead reach  Baseline: 90 deg Goal status: INITIAL  4.  Patient will be able to reach behind back to T10 in order to improve dressing and bathing ability Baseline: Sacrum Goal status: INITIAL  5. Patient will demonstrate right rotator cuff strength 5/5 MMT  in order to improve  lifting and carrying with the right arm  Basline: not assessed at eval  Goal status: INITIAL  PLAN: PT FREQUENCY: 1-2x/week  PT DURATION: 8 weeks  PLANNED INTERVENTIONS: 97164- PT Re-evaluation, 97110-Therapeutic exercises, 97530- Therapeutic activity, 97112- Neuromuscular re-education, 97535- Self Care, 16109- Manual therapy, [id number]- Aquatic Therapy, 97014- Electrical stimulation (unattended), [id number]- Electrical stimulation (manual), 97016- Vasopneumatic device, [id number]- Ionotophoresis 4mg /ml Dexamethasone , Patient/Family education, Balance training, Taping, Dry Needling, Joint mobilization, Joint manipulation, Spinal manipulation, Spinal mobilization, Cryotherapy, and Moist heat  PLAN FOR NEXT SESSION: Review HEP and progress PRN, manual/mobs/PROM for right shoulder motion, AROM and progress right shoulder strength and postural control as tolerated. Begin more functional activities/Standing exercises.    Jesse Moritz, Student-PT 05/28/2023, 9:32 AM

## 2023-05-29 ENCOUNTER — Other Ambulatory Visit: Payer: Self-pay

## 2023-05-29 ENCOUNTER — Encounter: Payer: Self-pay | Admitting: Physical Therapy

## 2023-05-29 ENCOUNTER — Ambulatory Visit: Payer: PRIVATE HEALTH INSURANCE | Admitting: Physical Therapy

## 2023-05-29 DIAGNOSIS — M6281 Muscle weakness (generalized): Secondary | ICD-10-CM | POA: Diagnosis not present

## 2023-05-29 DIAGNOSIS — G8929 Other chronic pain: Secondary | ICD-10-CM

## 2023-05-29 NOTE — Patient Instructions (Signed)
Access Code: [email or web address] URL: [email or web address] Date: 05/29/2023 Prepared by: Rosana Hoes  Exercises - Supine Single Arm Shoulder Flexion with Resistance  - 1 x daily - 3 sets - 10 reps - Standing shoulder flexion wall slides  - 2 x daily - 2 sets - 10 reps - Shoulder External Rotation with Anchored Resistance  - 1 x daily - 3 sets - 15 reps - Standing Row with Anchored Resistance  - 1 x daily - 3 sets - 15 reps - Single Arm Scaption with Resistance  - 1 x daily - 3 sets - 10 reps

## 2023-05-29 NOTE — Therapy (Signed)
OUTPATIENT PHYSICAL THERAPY TREATMENT   Patient Name: Brenda Mcmahon MRN: [id number] DOB:15-Jan-1991, 33 y.o., female Today's Date: 05/29/2023  END OF SESSION:  PT End of Session - 05/29/23 0824     Visit Number 11    Number of Visits 17    Date for PT Re-Evaluation 06/14/23    Authorization Type Worker's Comp    Authorization Time Period complete by 06/12/2023    Authorization - Visit Number 5    Authorization - Number of Visits 8    PT Start Time 0803    PT Stop Time 0901    PT Time Calculation (min) 58 min    Activity Tolerance Patient tolerated treatment well    Behavior During Therapy [facility] for tasks assessed/performed              Past Medical History:  Diagnosis Date   Achilles bursitis of right lower extremity 03/14/2017   Anxiety    Chronic right shoulder pain 03/16/2023   Depression 08/07/2021   Dysrhythmia    Encounter for other contraceptive management 08/07/2021   GAD (generalized anxiety disorder) 08/07/2021   Heart murmur    Moderate recurrent major depression (HCC) 08/07/2021   Need for DTaP vaccine 09/18/2021   Other insomnia 10/14/2022   Palpitation    Post-traumatic stress disorder, chronic 02/22/2023   Recurrent major depression-severe (HCC) 03/14/2017   Routine medical exam 09/18/2021   Past Surgical History:  Procedure Laterality Date   FOOT FRACTURE SURGERY Right 2015   also ankle fracture repaired.    SHOULDER ARTHROSCOPY WITH ROTATOR CUFF REPAIR AND SUBACROMIAL DECOMPRESSION Right 03/28/2023   Procedure: SHOULDER ARTHROSCOPY  AND SUBACROMIAL DECOMPRESSION DEBRIDEMENT;  Surgeon: Jene Every, MD;  Location: [facility];  Service: Orthopedics;  Laterality: Right;   WRIST FRACTURE SURGERY Left    33 yo.  fell through glass door.    Patient Active Problem List   Diagnosis Date Noted   SVT (supraventricular tachycardia) (HCC) 05/16/2023   Anxiety    Dysrhythmia    Heart murmur    Chronic right shoulder pain 03/16/2023   Post-traumatic  stress disorder, chronic 02/22/2023   Other insomnia 10/14/2022   Routine medical exam 09/18/2021   Need for DTaP vaccine 09/18/2021   GAD (generalized anxiety disorder) 08/07/2021   Encounter for other contraceptive management 08/07/2021   Moderate recurrent major depression (HCC) 08/07/2021   Palpitation 11/10/2020   Achilles bursitis of right lower extremity 03/14/2017   Recurrent major depression-severe (HCC) 03/14/2017    PCP: Blane Ohara, MD  REFERRING PROVIDER: Jene Every, MD  REFERRING DIAG: Encounter for other specified surgical aftercare   THERAPY DIAG:  Chronic right shoulder pain  Muscle weakness (generalized)  Rationale for Evaluation and Treatment: Rehabilitation  ONSET DATE: 03/28/2023   SUBJECTIVE:                           SUBJECTIVE STATEMENT: Patient reports she continues to do well. Denies any pain but continues to note some tightness.  Hand dominance: Ambidextrous  PERTINENT HISTORY: Right shoulder arthroscopy 03/28/2023  PAIN:  Are you having pain? NPRS scale: 0 /10  Pain location: Right shoulder Pain description: Tight Aggravating factors: Reaching overhead or behind back Relieving factors: Heat, medication  PRECAUTIONS: None  PATIENT GOALS: Pain relief, be able to use the right arm, return to prior level of function    OBJECTIVE:  Note: Objective measures were completed at Evaluation unless otherwise noted. PATIENT SURVEYS:  FOTO 51%  functional status  POSTURE: Rounded shoulder posture, she keeps right arm guarded by side  UPPER EXTREMITY ROM:   Active ROM Right eval Left eval Right 04/27/2023 Right 05/08/2023 Right 05/28/2023  Shoulder flexion 90 170 123 140 150   Shoulder extension 40 60     Shoulder abduction 70 180  135 130   Shoulder adduction       Shoulder internal rotation Sacrum T6  L5 L1   Shoulder external rotation 65 / occiput 85 / T4   90 deg/ T6  Elbow flexion       Elbow extension       Wrist flexion        Wrist extension       Wrist ulnar deviation       Wrist radial deviation       Wrist pronation       Wrist supination       (Blank rows = not tested)  Passive ROM Right eval Left eval  Shoulder flexion 150   Shoulder extension    Shoulder abduction 120   Shoulder adduction    Shoulder internal rotation 50 @ 90   Shoulder external rotation 70 @ 0 and 90   Elbow flexion    Elbow extension    Wrist flexion    Wrist extension    Wrist ulnar deviation    Wrist radial deviation    Wrist pronation    Wrist supination    (Blank rows = not tested)  UPPER EXTREMITY MMT:  MMT Right eval Left eval  Shoulder flexion    Shoulder extension    Shoulder abduction    Shoulder adduction    Shoulder internal rotation    Shoulder external rotation    Middle trapezius    Lower trapezius    Elbow flexion    Elbow extension    Wrist flexion    Wrist extension    Wrist ulnar deviation    Wrist radial deviation    Wrist pronation    Wrist supination    Grip strength (lbs)    (Blank rows = not tested)  Strength not assessed at eval  JOINT MOBILITY TESTING:  Hypomobility of right GHJ  PALPATION:  Generalized tenderness of right shoulder                                                                                                             TREATMENT TODAY: [facility] Adult PT Treatment:                                                DATE: 05/29/2023 UBE L1 x 4 min (fwd/bwd) while taking subjective Doorway pec stretch at 90 deg 3 x 30 sec Incline (70 deg) shoulder flexion with 2# 3 x 12 Single arm row with blue 3 x 15 Standing scaption with 2# to 90 deg 3 x 10 Standing rhythmic stabilization with  arm at 90 deg holding physioball 3 x 30 sec Serratus wall slide 2 x 10 Banded ER at neutral with green 3 x 15 Tall plank at counter alternating shoulder tap 2 x 20  Trigger Point Dry Needling  Subsequent Treatment: Instructions provided previously at initial dry needling treatment.   Instructions reviewed, if requested by the patient, prior to subsequent dry needling treatment.   Patient Verbal Consent Given: Yes Education Handout Provided: Previously Provided Muscles Treated: Left middle and anterior deltoid, left pec major Electrical Stimulation Performed: No Treatment Response/Outcome: Twitch response, patient report improvement in symptoms   PATIENT EDUCATION: Education details: HEP update Person educated: Patient Education method: Explanation, Demonstration, Tactile cues, Verbal cues Education comprehension: verbalized understanding, returned demonstration, verbal cues required, tactile cues required, and needs further education  HOME EXERCISE PROGRAM: Access Code: [email or web address]   ASSESSMENT: CLINICAL IMPRESSION: Patient tolerated therapy well with no adverse effects. Therapy continues to focus on reducing right shoulder muscle tension and progressing her shoulder motion and strength in order to improve reaching and performing daily activities. Continued with TPDN for the right deltoid and pec with good therapeutic benefit. She is progressing well with her rotator cuff and periscapular strengthening. She was able to progress to upright shoulder elevation strengthening increased resistance for rotator cuff strengthening. Initiated closed chain exercises with good tolerance. She does require occasional cueing to avoid shrug but is able to correct. Updated her HEP to progress strengthening for home. Patient would benefit from continued skilled PT to progress her mobility and strength in order to reduce pain and maximize functional ability.    EVAL: Patient is a 33 y.o. female who was seen today for physical therapy evaluation and treatment for right shoulder pain following arthroscopy on 03/28/2023. Currently she demonstrates limitations with her right shoulder motion, strength not assessed this visit due to pain. She was provided exercises to initiate shoulder mobility  and light rotator cuff work with good tolerance.   OBJECTIVE IMPAIRMENTS: decreased activity tolerance, decreased ROM, decreased strength, impaired UE functional use, postural dysfunction, and pain.   ACTIVITY LIMITATIONS: carrying, lifting, sleeping, bathing, dressing, reach over head, and hygiene/grooming  PARTICIPATION LIMITATIONS: meal prep, cleaning, laundry, shopping, community activity, and occupation  PERSONAL FACTORS: Past/current experiences and Time since onset of injury/illness/exacerbation are also affecting patient's functional outcome.    GOALS: Goals reviewed with patient? Yes  SHORT TERM GOALS: Target date: 05/17/2023  Patient will be I with initial HEP in order to progress with therapy. Baseline: HEP provided at eval 05/08/2023: independent with her initial HEP Goal status: MET  2.  Patient will report right shoulder pain </= 4/10 in order to reduce functional limitations Baseline: 7/10 05/08/2023: denies pain but reports tightness Goal status: MET  3.  Patient will demonstrate right shoulder active elevation >/= 120 deg in order to improve self care ability Baseline: 90 deg 05/08/2023: 140 deg Goal status: MET  LONG TERM GOALS: Target date: 06/14/2023  Patient will be I with final HEP to maintain progress from PT.  Baseline: HEP provided at eval Goal status: INITIAL  2.  Patient will report >/= 65% status on FOTO to indicate improved functional ability. Baseline: 51% functional status Goal status: INITIAL  3.  Patient will demonstrate right shoulder active elevation >/= 160 deg to normalize overhead reach  Baseline: 90 deg Goal status: INITIAL  4.  Patient will be able to reach behind back to T10 in order to improve dressing and bathing ability Baseline: Sacrum Goal status: INITIAL  5. Patient will demonstrate right rotator cuff strength 5/5 MMT in order to improve lifting and carrying with the right arm  Basline: not assessed at eval  Goal status:  INITIAL   PLAN: PT FREQUENCY: 1-2x/week  PT DURATION: 8 weeks  PLANNED INTERVENTIONS: 97164- PT Re-evaluation, 97110-Therapeutic exercises, 97530- Therapeutic activity, 97112- Neuromuscular re-education, 97535- Self Care, 16109- Manual therapy, [id number]- Aquatic Therapy, 97014- Electrical stimulation (unattended), [id number]- Electrical stimulation (manual), 97016- Vasopneumatic device, 97033- Ionotophoresis 4mg /ml Dexamethasone, Patient/Family education, Balance training, Taping, Dry Needling, Joint mobilization, Joint manipulation, Spinal manipulation, Spinal mobilization, Cryotherapy, and Moist heat  PLAN FOR NEXT SESSION: Review HEP and progress PRN, manual/mobs/PROM for right shoulder motion, AROM and progress right shoulder strength and postural control as tolerated. Begin more functional activities/Standing exercises.    Rosana Hoes, PT, DPT, LAT, ATC 05/29/23  10:21 AM Phone: [phone] Fax: [phone]

## 2023-06-04 ENCOUNTER — Encounter: Payer: Self-pay | Admitting: Physical Therapy

## 2023-06-04 ENCOUNTER — Ambulatory Visit: Payer: PRIVATE HEALTH INSURANCE | Admitting: Physical Therapy

## 2023-06-04 ENCOUNTER — Other Ambulatory Visit: Payer: Self-pay

## 2023-06-04 DIAGNOSIS — G8929 Other chronic pain: Secondary | ICD-10-CM

## 2023-06-04 DIAGNOSIS — M25511 Pain in right shoulder: Secondary | ICD-10-CM | POA: Diagnosis not present

## 2023-06-04 DIAGNOSIS — M6281 Muscle weakness (generalized): Secondary | ICD-10-CM

## 2023-06-04 NOTE — Therapy (Signed)
OUTPATIENT PHYSICAL THERAPY TREATMENT   Patient Name: Brenda Mcmahon MRN: [id number] DOB:[birth year], 33 y.o., female Today's Date: 06/04/2023   END OF SESSION:  PT End of Session - 06/04/23 0804     Visit Number 12    Number of Visits 17    Date for PT Re-Evaluation 06/14/23    Authorization Type Worker's Comp    Authorization Time Period complete by 06/12/2023    Authorization - Visit Number 6    Authorization - Number of Visits 8    PT Start Time 0800    PT Stop Time 0845    PT Time Calculation (min) 45 min    Activity Tolerance Patient tolerated treatment well    Behavior During Therapy [facility] for tasks assessed/performed               Past Medical History:  Diagnosis Date   Achilles bursitis of right lower extremity 03/14/2017   Anxiety    Chronic right shoulder pain 03/16/2023   Depression 08/07/2021   Dysrhythmia    Encounter for other contraceptive management 08/07/2021   GAD (generalized anxiety disorder) 08/07/2021   Heart murmur    Moderate recurrent major depression (HCC) 08/07/2021   Need for DTaP vaccine 09/18/2021   Other insomnia 10/14/2022   Palpitation    Post-traumatic stress disorder, chronic 02/22/2023   Recurrent major depression-severe (HCC) 03/14/2017   Routine medical exam 09/18/2021   Past Surgical History:  Procedure Laterality Date   FOOT FRACTURE SURGERY Right 2015   also ankle fracture repaired.    SHOULDER ARTHROSCOPY WITH ROTATOR CUFF REPAIR AND SUBACROMIAL DECOMPRESSION Right 03/28/2023   Procedure: SHOULDER ARTHROSCOPY  AND SUBACROMIAL DECOMPRESSION DEBRIDEMENT;  Surgeon: Jene Every, MD;  Location: [facility];  Service: Orthopedics;  Laterality: Right;   WRIST FRACTURE SURGERY Left    33 yo.  fell through glass door.    Patient Active Problem List   Diagnosis Date Noted   SVT (supraventricular tachycardia) (HCC) 05/16/2023   Anxiety    Dysrhythmia    Heart murmur    Chronic right shoulder pain 03/16/2023   Post-traumatic  stress disorder, chronic 02/22/2023   Other insomnia 10/14/2022   Routine medical exam 09/18/2021   Need for DTaP vaccine 09/18/2021   GAD (generalized anxiety disorder) 08/07/2021   Encounter for other contraceptive management 08/07/2021   Moderate recurrent major depression (HCC) 08/07/2021   Palpitation 11/10/2020   Achilles bursitis of right lower extremity 03/14/2017   Recurrent major depression-severe (HCC) 03/14/2017    PCP: Blane Ohara, MD  REFERRING PROVIDER: Jene Every, MD  REFERRING DIAG: Encounter for other specified surgical aftercare   THERAPY DIAG:  Chronic right shoulder pain  Muscle weakness (generalized)  Rationale for Evaluation and Treatment: Rehabilitation  ONSET DATE: 03/28/2023   SUBJECTIVE:                           SUBJECTIVE STATEMENT: Patient reports she is doing well. No new issues. She did pick up a case of water this weekend.  Hand dominance: Ambidextrous  PERTINENT HISTORY: Right shoulder arthroscopy 03/28/2023  PAIN:  Are you having pain? NPRS scale: 0 /10  Pain location: Right shoulder Pain description: Tight Aggravating factors: Reaching overhead or behind back Relieving factors: Heat, medication  PRECAUTIONS: None  PATIENT GOALS: Pain relief, be able to use the right arm, return to prior level of function    OBJECTIVE:  Note: Objective measures were completed at Evaluation unless otherwise noted.  PATIENT SURVEYS:  FOTO 51% functional status  06/04/2023: 72%  POSTURE: Rounded shoulder posture, she keeps right arm guarded by side  UPPER EXTREMITY ROM:   Active ROM Right eval Left eval Right 04/27/2023 Right 05/08/2023 Right 05/28/2023  Shoulder flexion 90 170 123 140 150   Shoulder extension 40 60     Shoulder abduction 70 180  135 130   Shoulder adduction       Shoulder internal rotation Sacrum T6  L5 L1   Shoulder external rotation 65 / occiput 85 / T4   90 deg/ T6  Elbow flexion       Elbow extension        Wrist flexion       Wrist extension       Wrist ulnar deviation       Wrist radial deviation       Wrist pronation       Wrist supination       (Blank rows = not tested)  Passive ROM Right eval Left eval  Shoulder flexion 150   Shoulder extension    Shoulder abduction 120   Shoulder adduction    Shoulder internal rotation 50 @ 90   Shoulder external rotation 70 @ 0 and 90   Elbow flexion    Elbow extension    Wrist flexion    Wrist extension    Wrist ulnar deviation    Wrist radial deviation    Wrist pronation    Wrist supination    (Blank rows = not tested)  UPPER EXTREMITY MMT:  MMT Right eval Left eval  Shoulder flexion    Shoulder extension    Shoulder abduction    Shoulder adduction    Shoulder internal rotation    Shoulder external rotation    Middle trapezius    Lower trapezius    Elbow flexion    Elbow extension    Wrist flexion    Wrist extension    Wrist ulnar deviation    Wrist radial deviation    Wrist pronation    Wrist supination    Grip strength (lbs)    (Blank rows = not tested)  Strength not assessed at eval  JOINT MOBILITY TESTING:  Hypomobility of right GHJ  PALPATION:  Generalized tenderness of right shoulder                                                                                                              TREATMENT TODAY: [facility] Adult PT Treatment:                                                DATE: 06/04/2023 UBE L1 x 4 min (fwd/bwd) while taking subjective Incline (70 deg) shoulder flexion with 3# 3 x 10 Banded ER at neutral with green 3 x 15 Serratus wall slide with FR 2 x 10 Wall ball circles at 90 deg  flexion and abduction 2 x 20 cw/ccw Prone Y and T thumb up 2 x 10 Standing scaption with 2# to 90 deg 3 x 10 Tall plank at elevated table alternating shoulder tap 3 x 20   PATIENT EDUCATION: Education details: HEP Person educated: Patient Education method: Explanation, Demonstration, Tactile cues, Verbal  cues Education comprehension: verbalized understanding, returned demonstration, verbal cues required, tactile cues required, and needs further education  HOME EXERCISE PROGRAM: Access Code: [email or web address]   ASSESSMENT: CLINICAL IMPRESSION: Patient tolerated therapy well with no adverse effects. Therapy continued focus on progressing right shoulder strength and endurance with good tolerance. She was able to progress with rotator cuff resistance exercises but does report shoulder fatigue with exercise. Continued cueing to avoid scapular shrug when fatigued. No changes made to her HEP this visit. Patient would benefit from continued skilled PT to progress her mobility and strength in order to reduce pain and maximize functional ability.    EVAL: Patient is a 33 y.o. female who was seen today for physical therapy evaluation and treatment for right shoulder pain following arthroscopy on 03/28/2023. Currently she demonstrates limitations with her right shoulder motion, strength not assessed this visit due to pain. She was provided exercises to initiate shoulder mobility and light rotator cuff work with good tolerance.   OBJECTIVE IMPAIRMENTS: decreased activity tolerance, decreased ROM, decreased strength, impaired UE functional use, postural dysfunction, and pain.   ACTIVITY LIMITATIONS: carrying, lifting, sleeping, bathing, dressing, reach over head, and hygiene/grooming  PARTICIPATION LIMITATIONS: meal prep, cleaning, laundry, shopping, community activity, and occupation  PERSONAL FACTORS: Past/current experiences and Time since onset of injury/illness/exacerbation are also affecting patient's functional outcome.    GOALS: Goals reviewed with patient? Yes  SHORT TERM GOALS: Target date: 05/17/2023  Patient will be I with initial HEP in order to progress with therapy. Baseline: HEP provided at eval 05/08/2023: independent with her initial HEP Goal status: MET  2.  Patient will report right  shoulder pain </= 4/10 in order to reduce functional limitations Baseline: 7/10 05/08/2023: denies pain but reports tightness Goal status: MET  3.  Patient will demonstrate right shoulder active elevation >/= 120 deg in order to improve self care ability Baseline: 90 deg 05/08/2023: 140 deg Goal status: MET  LONG TERM GOALS: Target date: 06/14/2023  Patient will be I with final HEP to maintain progress from PT.  Baseline: HEP provided at eval Goal status: INITIAL  2.  Patient will report >/= 65% status on FOTO to indicate improved functional ability. Baseline: 51% functional status 06/04/2023: 72% Goal status: MET  3.  Patient will demonstrate right shoulder active elevation >/= 160 deg to normalize overhead reach  Baseline: 90 deg Goal status: INITIAL  4.  Patient will be able to reach behind back to T10 in order to improve dressing and bathing ability Baseline: Sacrum Goal status: INITIAL  5. Patient will demonstrate right rotator cuff strength 5/5 MMT in order to improve lifting and carrying with the right arm  Basline: not assessed at eval  Goal status: INITIAL   PLAN: PT FREQUENCY: 1-2x/week  PT DURATION: 8 weeks  PLANNED INTERVENTIONS: 97164- PT Re-evaluation, 97110-Therapeutic exercises, 97530- Therapeutic activity, 97112- Neuromuscular re-education, 97535- Self Care, 11914- Manual therapy, [id number]- Aquatic Therapy, 97014- Electrical stimulation (unattended), [id number]- Electrical stimulation (manual), 97016- Vasopneumatic device, [id number]- Ionotophoresis 4mg /ml Dexamethasone, Patient/Family education, Balance training, Taping, Dry Needling, Joint mobilization, Joint manipulation, Spinal manipulation, Spinal mobilization, Cryotherapy, and Moist heat  PLAN FOR NEXT SESSION: Review HEP and progress PRN, manual/mobs/PROM  for right shoulder motion, AROM and progress right shoulder strength and postural control as tolerated. Begin more functional activities/Standing exercises.     Rosana Hoes, PT, DPT, LAT, ATC 06/04/23  12:42 PM Phone: [phone] Fax: [phone]

## 2023-06-06 ENCOUNTER — Other Ambulatory Visit: Payer: Self-pay

## 2023-06-06 ENCOUNTER — Ambulatory Visit: Payer: PRIVATE HEALTH INSURANCE | Admitting: Physical Therapy

## 2023-06-06 DIAGNOSIS — M25511 Pain in right shoulder: Secondary | ICD-10-CM | POA: Diagnosis not present

## 2023-06-06 DIAGNOSIS — G8929 Other chronic pain: Secondary | ICD-10-CM

## 2023-06-06 DIAGNOSIS — M6281 Muscle weakness (generalized): Secondary | ICD-10-CM

## 2023-06-06 NOTE — Therapy (Cosign Needed)
OUTPATIENT PHYSICAL THERAPY TREATMENT   Patient Name: Brenda Mcmahon MRN: [id number] DOB:[birth year], 33 y.o., female Today's Date: 06/06/2023  END OF SESSION:  PT End of Session - 06/06/23 0957     Visit Number 13    Number of Visits 17    Date for PT Re-Evaluation 06/14/23    Authorization Type Workers comp    Authorization Time Period 2/25    Authorization - Visit Number 7    Authorization - Number of Visits 8    PT Start Time 0800    PT Stop Time 0845    PT Time Calculation (min) 45 min    Activity Tolerance Patient tolerated treatment well    Behavior During Therapy [facility] for tasks assessed/performed             Past Medical History:  Diagnosis Date   Achilles bursitis of right lower extremity 03/14/2017   Anxiety    Chronic right shoulder pain 03/16/2023   Depression 08/07/2021   Dysrhythmia    Encounter for other contraceptive management 08/07/2021   GAD (generalized anxiety disorder) 08/07/2021   Heart murmur    Moderate recurrent major depression (HCC) 08/07/2021   Need for DTaP vaccine 09/18/2021   Other insomnia 10/14/2022   Palpitation    Post-traumatic stress disorder, chronic 02/22/2023   Recurrent major depression-severe (HCC) 03/14/2017   Routine medical exam 09/18/2021   Past Surgical History:  Procedure Laterality Date   FOOT FRACTURE SURGERY Right 2015   also ankle fracture repaired.    SHOULDER ARTHROSCOPY WITH ROTATOR CUFF REPAIR AND SUBACROMIAL DECOMPRESSION Right 03/28/2023   Procedure: SHOULDER ARTHROSCOPY  AND SUBACROMIAL DECOMPRESSION DEBRIDEMENT;  Surgeon: Jene Every, MD;  Location: [facility];  Service: Orthopedics;  Laterality: Right;   WRIST FRACTURE SURGERY Left    33 yo.  fell through glass door.    Patient Active Problem List   Diagnosis Date Noted   SVT (supraventricular tachycardia) (HCC) 05/16/2023   Anxiety    Dysrhythmia    Heart murmur    Chronic right shoulder pain 03/16/2023   Post-traumatic stress disorder,  chronic 02/22/2023   Other insomnia 10/14/2022   Routine medical exam 09/18/2021   Need for DTaP vaccine 09/18/2021   GAD (generalized anxiety disorder) 08/07/2021   Encounter for other contraceptive management 08/07/2021   Moderate recurrent major depression (HCC) 08/07/2021   Palpitation 11/10/2020   Achilles bursitis of right lower extremity 03/14/2017   Recurrent major depression-severe (HCC) 03/14/2017   PCP: Blane Ohara, MD  REFERRING PROVIDER: Jene Every, MD  REFERRING DIAG: Encounter for other specified surgical aftercare   THERAPY DIAG:  Chronic right shoulder pain  Muscle weakness (generalized)  Rationale for Evaluation and Treatment: Rehabilitation  ONSET DATE: 03/28/2023  SUBJECTIVE:                           SUBJECTIVE STATEMENT: Patient reports she is doing well. Patient is experiencing soreness but not pain. She was able to carry a 24 pack case of ginger-ale last night at [facility].  Hand dominance: Ambidextrous  PERTINENT HISTORY: Right shoulder arthroscopy 03/28/2023  PAIN:  Are you having pain? NPRS scale: 0 /10  Pain location: Right shoulder Pain description: Tight Aggravating factors: Reaching overhead or behind back Relieving factors: Heat, medication  PRECAUTIONS: None  PATIENT GOALS: Pain relief, be able to use the right arm, return to prior level of function   OBJECTIVE:  Note: Objective measures were completed at Evaluation unless otherwise  noted. PATIENT SURVEYS:  FOTO 51% functional status  06/04/2023: 72%  POSTURE: Rounded shoulder posture, she keeps right arm guarded by side  UPPER EXTREMITY ROM:   Active ROM Right eval Left eval Right 04/27/2023 Right 05/08/2023 Right 05/28/2023  Shoulder flexion 90 170 123 140 150   Shoulder extension 40 60     Shoulder abduction 70 180  135 130   Shoulder adduction       Shoulder internal rotation Sacrum T6  L5 L1   Shoulder external rotation 65 / occiput 85 / T4   90 deg/ T6  Elbow  flexion       Elbow extension       Wrist flexion       Wrist extension       Wrist ulnar deviation       Wrist radial deviation       Wrist pronation       Wrist supination       (Blank rows = not tested)  Passive ROM Right eval Left eval  Shoulder flexion 150   Shoulder extension    Shoulder abduction 120   Shoulder adduction    Shoulder internal rotation 50 @ 90   Shoulder external rotation 70 @ 0 and 90   Elbow flexion    Elbow extension    Wrist flexion    Wrist extension    Wrist ulnar deviation    Wrist radial deviation    Wrist pronation    Wrist supination    (Blank rows = not tested)  UPPER EXTREMITY MMT:  MMT Right eval Left eval  Shoulder flexion    Shoulder extension    Shoulder abduction    Shoulder adduction    Shoulder internal rotation    Shoulder external rotation    Middle trapezius    Lower trapezius    Elbow flexion    Elbow extension    Wrist flexion    Wrist extension    Wrist ulnar deviation    Wrist radial deviation    Wrist pronation    Wrist supination    Grip strength (lbs)    (Blank rows = not tested)  Strength not assessed at eval  JOINT MOBILITY TESTING:  Hypomobility of right GHJ  PALPATION:  Generalized tenderness of right shoulder                                                                         TREATMENT TODAY:  [facility] Adult PT Treatment:    DATE: 06/06/2023 UBE L1 x 4 min (fwd/bwd) while taking subjective  Arm Circuit: Incline (70 deg) shoulder flexion with 3# 3x10 Standing scaption with 2# to 90 deg 3x10  Low seated row #35 3x10  Wall ball circles at 90 deg flexion and abduction 2x20 cw/ccw Prone Y and T thumb up 2x10  Standing shoulder flexion shelf taps #3, 2x8   [facility] Adult PT Treatment:                                                DATE: 06/04/2023 UBE L1 x 4 min (  fwd/bwd) while taking subjective Incline (70 deg) shoulder flexion with 3# 3 x 10 Banded ER at neutral with green 3 x 15 Serratus wall  slide with FR 2 x 10 Wall ball circles at 90 deg flexion and abduction 2 x 20 cw/ccw Prone Y and T thumb up 2 x 10 Standing scaption with 2# to 90 deg 3 x 10 Tall plank at elevated table alternating shoulder tap 3 x 20  PATIENT EDUCATION: Education details: HEP Person educated: Patient Education method: Programmer, multimedia, Demonstration, Tactile cues, Verbal cues Education comprehension: verbalized understanding, returned demonstration, verbal cues required, tactile cues required, and needs further education  HOME EXERCISE PROGRAM: Access Code: [email or web address]   ASSESSMENT: CLINICAL IMPRESSION: Patient tolerated therapy well with no adverse effects. Therapy continued focus on progressing right shoulder strength and endurance with good tolerance. Added in an arm circuit today to focus on working through fatigue. She did not report as much fatigue during exercises today. Slight scapular shrug cued a few times during session. No changes made to her HEP this visit. Patient would benefit from continued skilled PT to progress her mobility and strength in order to reduce pain and maximize functional ability.   EVAL: Patient is a 33 y.o. female who was seen today for physical therapy evaluation and treatment for right shoulder pain following arthroscopy on 03/28/2023. Currently she demonstrates limitations with her right shoulder motion, strength not assessed this visit due to pain. She was provided exercises to initiate shoulder mobility and light rotator cuff work with good tolerance.   OBJECTIVE IMPAIRMENTS: decreased activity tolerance, decreased ROM, decreased strength, impaired UE functional use, postural dysfunction, and pain.   ACTIVITY LIMITATIONS: carrying, lifting, sleeping, bathing, dressing, reach over head, and hygiene/grooming  PARTICIPATION LIMITATIONS: meal prep, cleaning, laundry, shopping, community activity, and occupation  PERSONAL FACTORS: Past/current experiences and Time since onset of  injury/illness/exacerbation are also affecting patient's functional outcome.   GOALS: Goals reviewed with patient? Yes  SHORT TERM GOALS: Target date: 05/17/2023  Patient will be I with initial HEP in order to progress with therapy. Baseline: HEP provided at eval 05/08/2023: independent with her initial HEP Goal status: MET  2.  Patient will report right shoulder pain </= 4/10 in order to reduce functional limitations Baseline: 7/10 05/08/2023: denies pain but reports tightness Goal status: MET  3.  Patient will demonstrate right shoulder active elevation >/= 120 deg in order to improve self care ability Baseline: 90 deg 05/08/2023: 140 deg Goal status: MET  LONG TERM GOALS: Target date: 06/14/2023  Patient will be I with final HEP to maintain progress from PT.  Baseline: HEP provided at eval Goal status: INITIAL  2.  Patient will report >/= 65% status on FOTO to indicate improved functional ability. Baseline: 51% functional status 06/04/2023: 72% Goal status: MET  3.  Patient will demonstrate right shoulder active elevation >/= 160 deg to normalize overhead reach  Baseline: 90 deg Goal status: INITIAL  4.  Patient will be able to reach behind back to T10 in order to improve dressing and bathing ability Baseline: Sacrum Goal status: INITIAL  5. Patient will demonstrate right rotator cuff strength 5/5 MMT in order to improve lifting and carrying with the right arm  Basline: not assessed at eval  Goal status: INITIAL   PLAN: PT FREQUENCY: 1-2x/week  PT DURATION: 8 weeks  PLANNED INTERVENTIONS: 97164- PT Re-evaluation, 97110-Therapeutic exercises, 97530- Therapeutic activity, [id number]- Neuromuscular re-education, 97535- Self Care, [id number]- Manual therapy, [id number]- Aquatic Therapy, 97014- Electrical stimulation (unattended), [id number]-  Electrical stimulation (manual), 16109- Vasopneumatic device, [id number]- Ionotophoresis 4mg /ml Dexamethasone, Patient/Family education, Balance training,  Taping, Dry Needling, Joint mobilization, Joint manipulation, Spinal manipulation, Spinal mobilization, Cryotherapy, and Moist heat  PLAN FOR NEXT SESSION: Review HEP and progress PRN, manual/mobs/PROM for right shoulder motion, AROM and progress right shoulder strength and postural control as tolerated. Continue to progress postural and functional activities.   Erin Hearing, Student-PT 06/06/2023, 10:02 AM

## 2023-06-11 ENCOUNTER — Encounter: Payer: Self-pay | Admitting: Physical Therapy

## 2023-06-11 ENCOUNTER — Other Ambulatory Visit: Payer: Self-pay

## 2023-06-11 ENCOUNTER — Ambulatory Visit: Payer: PRIVATE HEALTH INSURANCE | Admitting: Physical Therapy

## 2023-06-11 DIAGNOSIS — M6281 Muscle weakness (generalized): Secondary | ICD-10-CM | POA: Diagnosis not present

## 2023-06-11 DIAGNOSIS — G8929 Other chronic pain: Secondary | ICD-10-CM

## 2023-06-11 NOTE — Therapy (Addendum)
 OUTPATIENT PHYSICAL THERAPY TREATMENT   Patient Name: Brenda Mcmahon MRN: [id number] DOB:1990/11/18, 33 y.o., female Today's Date: 06/11/2023   END OF SESSION:  PT End of Session - 06/11/23 0906     Visit Number 14    Number of Visits 17    Date for PT Re-Evaluation 06/14/23    Authorization Type Workers Comp    Authorization - Visit Number 8    Authorization - Number of Visits 8    PT Start Time 0800    PT Stop Time 0848    PT Time Calculation (min) 48 min    Activity Tolerance Patient tolerated treatment well    Behavior During Therapy [facility] for tasks assessed/performed            Past Medical History:  Diagnosis Date   Achilles bursitis of right lower extremity 03/14/2017   Anxiety    Chronic right shoulder pain 03/16/2023   Depression 08/07/2021   Dysrhythmia    Encounter for other contraceptive management 08/07/2021   GAD (generalized anxiety disorder) 08/07/2021   Heart murmur    Moderate recurrent major depression (HCC) 08/07/2021   Need for DTaP vaccine 09/18/2021   Other insomnia 10/14/2022   Palpitation    Post-traumatic stress disorder, chronic 02/22/2023   Recurrent major depression-severe (HCC) 03/14/2017   Routine medical exam 09/18/2021   Past Surgical History:  Procedure Laterality Date   FOOT FRACTURE SURGERY Right 2015   also ankle fracture repaired.    SHOULDER ARTHROSCOPY WITH ROTATOR CUFF REPAIR AND SUBACROMIAL DECOMPRESSION Right 03/28/2023   Procedure: SHOULDER ARTHROSCOPY  AND SUBACROMIAL DECOMPRESSION DEBRIDEMENT;  Surgeon: Jene Every, MD;  Location: [facility];  Service: Orthopedics;  Laterality: Right;   WRIST FRACTURE SURGERY Left    33 yo.  fell through glass door.    Patient Active Problem List   Diagnosis Date Noted   SVT (supraventricular tachycardia) (HCC) 05/16/2023   Anxiety    Dysrhythmia    Heart murmur    Chronic right shoulder pain 03/16/2023   Post-traumatic stress disorder, chronic 02/22/2023   Other insomnia  10/14/2022   Routine medical exam 09/18/2021   Need for DTaP vaccine 09/18/2021   GAD (generalized anxiety disorder) 08/07/2021   Encounter for other contraceptive management 08/07/2021   Moderate recurrent major depression (HCC) 08/07/2021   Palpitation 11/10/2020   Achilles bursitis of right lower extremity 03/14/2017   Recurrent major depression-severe (HCC) 03/14/2017   PCP: Blane Ohara, MD  REFERRING PROVIDER: Jene Every, MD  REFERRING DIAG: Encounter for other specified surgical aftercare   THERAPY DIAG:  Chronic right shoulder pain  Muscle weakness (generalized)  Rationale for Evaluation and Treatment: Rehabilitation  ONSET DATE: 03/28/2023   SUBJECTIVE:                           SUBJECTIVE STATEMENT: Patient reports she is doing great. She is only having tightness in the shoulder blade region and on the lateral shoulder. Patient feels like her ROM has gotten much better but she still notices fatigue and challenge with lifting objects. She is compliant with HEP at home. Is seeing DR tomorrow morning.   Hand dominance: Ambidextrous  PERTINENT HISTORY: Right shoulder arthroscopy 03/28/2023  PAIN:  Are you having pain? NPRS scale: 0 /10  Pain location: Right shoulder Pain description: Tight Aggravating factors: Reaching overhead or behind back Relieving factors: Heat, medication  PRECAUTIONS: None  PATIENT GOALS: Pain relief, be able to use the right arm,  return to prior level of function   OBJECTIVE:  Note: Objective measures were completed at Evaluation unless otherwise noted. PATIENT SURVEYS:  FOTO 51% functional status  06/04/2023: 72%  POSTURE: Rounded shoulder posture, she keeps right arm guarded by side  UPPER EXTREMITY ROM:   Active ROM Right eval Left eval Right 04/27/2023 Right 05/08/2023 Right 05/28/2023 Right 06/11/2023  Shoulder flexion 90 170 123 140 150  155 pre, 158 end of session  Shoulder extension 40 60      Shoulder abduction 70  180  135 130  170  Shoulder adduction        Shoulder internal rotation Sacrum T6  L5 L1  T10  Shoulder external rotation 65 / occiput 85 / T4   90 deg/ T6   Elbow flexion        Elbow extension        Wrist flexion        Wrist extension        Wrist ulnar deviation        Wrist radial deviation        Wrist pronation        Wrist supination        (Blank rows = not tested)  Passive ROM Right eval Left eval  Shoulder flexion 150   Shoulder extension    Shoulder abduction 120   Shoulder adduction    Shoulder internal rotation 50 @ 90   Shoulder external rotation 70 @ 0 and 90   Elbow flexion    Elbow extension    Wrist flexion    Wrist extension    Wrist ulnar deviation    Wrist radial deviation    Wrist pronation    Wrist supination    (Blank rows = not tested)  UPPER EXTREMITY MMT:  MMT Right eval Left eval Right 06/11/2023  Shoulder flexion   4-  Shoulder extension     Shoulder abduction   4-   Shoulder adduction     Shoulder internal rotation   4  Shoulder external rotation   4-  Middle trapezius     Lower trapezius     Elbow flexion     Elbow extension     Wrist flexion     Wrist extension     Wrist ulnar deviation     Wrist radial deviation     Wrist pronation     Wrist supination     Grip strength (lbs)     (Blank rows = not tested)  Strength not assessed at eval  JOINT MOBILITY TESTING:  Hypomobility of right GHJ  PALPATION:  Generalized tenderness of right shoulder                                                                         TREATMENT TODAY: [facility] Adult PT Treatment:    DATE: 06/11/2023 UBE L1x4 min (fwd/bwd) while taking subjective  Reviewed goals/reassessment of ROM and strength Standing flexion #2  through ROM to no shrug 2x8  Standing abduction #2 through ROM to no shrug 2x8 Tall plank at elevated table alternating shoulder tap 2x20 Standing shoulder D2 2x8 YTB  Wall ball circles at 90 deg flexion and abduction 2x30 cw/cc  PATIENT EDUCATION: Education details: HEP Person educated: Patient Education method: Solicitor, Actor cues, Verbal cues Education comprehension: verbalized understanding, returned demonstration, verbal cues required, tactile cues required, and needs further education  HOME EXERCISE PROGRAM: Access Code: [email or web address]   ASSESSMENT: CLINICAL IMPRESSION: Patient tolerated therapy well with no adverse effects. Her ROM continues to progress, achieving 150 of flexion and 170 degrees of abduction at the start of session. Goals were reviewed today and all were met but final ROM goal and strength goal. Strength deficits continue to be notable, with a shrug sign present past about 100 degrees of shoulder flexion and abduction with #2 weights. Continues to fatigue at 6-8 reps of any weighted scaption exercises, have not done exercises with more than 3# weights. No changes made to HEP this visit as she is seeing her doctor tomorrow. Will discuss POC on Wednesday but would benefit from continued sessions to increase strength and endurance. Patient wants to return to working out, completing multiple household activities, and be able to lift products at work which she currently would still be impacted by strength deficits. Overall, patient is making great progress in PT but would benefit from continued sessions in order to improve strength deficits and functional endurance.    EVAL: Patient is a 33 y.o. female who was seen today for physical therapy evaluation and treatment for right shoulder pain following arthroscopy on 03/28/2023. Currently she demonstrates limitations with her right shoulder motion, strength not assessed this visit due to pain. She was provided exercises to initiate shoulder mobility and light rotator cuff work with good tolerance.   OBJECTIVE IMPAIRMENTS: decreased activity tolerance, decreased ROM, decreased strength, impaired UE functional use, postural dysfunction, and  pain.   ACTIVITY LIMITATIONS: carrying, lifting, sleeping, bathing, dressing, reach over head, and hygiene/grooming  PARTICIPATION LIMITATIONS: meal prep, cleaning, laundry, shopping, community activity, and occupation  PERSONAL FACTORS: Past/current experiences and Time since onset of injury/illness/exacerbation are also affecting patient's functional outcome.   GOALS: Goals reviewed with patient? Yes  SHORT TERM GOALS: Target date: 05/17/2023  Patient will be I with initial HEP in order to progress with therapy. Baseline: HEP provided at eval 05/08/2023: independent with her initial HEP Goal status: MET  2.  Patient will report right shoulder pain </= 4/10 in order to reduce functional limitations Baseline: 7/10 05/08/2023: denies pain but reports tightness Goal status: MET  3.  Patient will demonstrate right shoulder active elevation >/= 120 deg in order to improve self care ability Baseline: 90 deg 05/08/2023: 140 deg Goal status: MET  LONG TERM GOALS: Target date: 06/14/2023  Patient will be I with final HEP to maintain progress from PT.  Baseline: HEP provided at eval 06/11/2023: progressing Goal status: ONGOING  2.  Patient will report >/= 65% status on FOTO to indicate improved functional ability. Baseline: 51% functional status 06/04/2023: 72% Goal status: MET  3.  Patient will demonstrate right shoulder active elevation >/= 160 deg to normalize overhead reach  Baseline: 90 deg 06/11/2023: 158  Goal status: IN PROGRESS  4.  Patient will be able to reach behind back to T10 in order to improve dressing and bathing ability Baseline: Sacrum 06/11/2023: T10  Goal status: MET  5. Patient will demonstrate right rotator cuff strength 5/5 MMT in order to improve lifting and carrying with the right arm  Basline: not assessed at eval  06/11/2023: 4-/5 MMT  Goal status: IN PROGRESS   PLAN: PT FREQUENCY: 1-2x/week  PT DURATION: 8 weeks  PLANNED INTERVENTIONS: 16109-  PT  Re-evaluation, 97110-Therapeutic exercises, 97530- Therapeutic activity, [id number]- Neuromuscular re-education, 97535- Self Care, 09811- Manual therapy, [phone]- Aquatic Therapy, 97014- Electrical stimulation (unattended), [id number]- Electrical stimulation (manual), 97016- Vasopneumatic device, [id number]- Ionotophoresis 4mg /ml Dexamethasone, Patient/Family education, Balance training, Taping, Dry Needling, Joint mobilization, Joint manipulation, Spinal manipulation, Spinal mobilization, Cryotherapy, and Moist heat  PLAN FOR NEXT SESSION: Review/revise HEP, continue to progress strengthening and functional endurance. Increase weight as tolerated.    Erin Hearing, Student-PT 06/11/2023, 10:39 AM

## 2023-06-13 ENCOUNTER — Other Ambulatory Visit: Payer: Self-pay

## 2023-06-13 ENCOUNTER — Ambulatory Visit: Payer: PRIVATE HEALTH INSURANCE | Admitting: Physical Therapy

## 2023-06-13 ENCOUNTER — Encounter: Payer: Self-pay | Admitting: Physical Therapy

## 2023-06-13 DIAGNOSIS — M25511 Pain in right shoulder: Secondary | ICD-10-CM | POA: Diagnosis not present

## 2023-06-13 DIAGNOSIS — M6281 Muscle weakness (generalized): Secondary | ICD-10-CM

## 2023-06-13 DIAGNOSIS — G8929 Other chronic pain: Secondary | ICD-10-CM

## 2023-06-13 NOTE — Therapy (Addendum)
 OUTPATIENT PHYSICAL THERAPY TREATMENT   Patient Name: Brenda Mcmahon MRN: [id number] DOB:1990/04/25, 33 y.o., female Today's Date: 06/13/2023   END OF SESSION:  PT End of Session - 06/13/23 0856     Visit Number 15    Number of Visits 19    Date for PT Re-Evaluation 07/13/23    PT Start Time 0800    PT Stop Time 0845    PT Time Calculation (min) 45 min    Activity Tolerance Patient tolerated treatment well    Behavior During Therapy [facility] for tasks assessed/performed            Past Medical History:  Diagnosis Date   Achilles bursitis of right lower extremity 03/14/2017   Anxiety    Chronic right shoulder pain 03/16/2023   Depression 08/07/2021   Dysrhythmia    Encounter for other contraceptive management 08/07/2021   GAD (generalized anxiety disorder) 08/07/2021   Heart murmur    Moderate recurrent major depression (HCC) 08/07/2021   Need for DTaP vaccine 09/18/2021   Other insomnia 10/14/2022   Palpitation    Post-traumatic stress disorder, chronic 02/22/2023   Recurrent major depression-severe (HCC) 03/14/2017   Routine medical exam 09/18/2021   Past Surgical History:  Procedure Laterality Date   FOOT FRACTURE SURGERY Right 2015   also ankle fracture repaired.    SHOULDER ARTHROSCOPY WITH ROTATOR CUFF REPAIR AND SUBACROMIAL DECOMPRESSION Right 03/28/2023   Procedure: SHOULDER ARTHROSCOPY  AND SUBACROMIAL DECOMPRESSION DEBRIDEMENT;  Surgeon: Jene Every, MD;  Location: [facility];  Service: Orthopedics;  Laterality: Right;   WRIST FRACTURE SURGERY Left    33 yo.  fell through glass door.    Patient Active Problem List   Diagnosis Date Noted   SVT (supraventricular tachycardia) (HCC) 05/16/2023   Anxiety    Dysrhythmia    Heart murmur    Chronic right shoulder pain 03/16/2023   Post-traumatic stress disorder, chronic 02/22/2023   Other insomnia 10/14/2022   Routine medical exam 09/18/2021   Need for DTaP vaccine 09/18/2021   GAD (generalized anxiety  disorder) 08/07/2021   Encounter for other contraceptive management 08/07/2021   Moderate recurrent major depression (HCC) 08/07/2021   Palpitation 11/10/2020   Achilles bursitis of right lower extremity 03/14/2017   Recurrent major depression-severe (HCC) 03/14/2017   PCP: Blane Ohara, MD  REFERRING PROVIDER: Jene Every, MD  REFERRING DIAG: Encounter for other specified surgical aftercare   THERAPY DIAG:  Chronic right shoulder pain  Muscle weakness (generalized)  Rationale for Evaluation and Treatment: Rehabilitation  ONSET DATE: 03/28/2023   SUBJECTIVE:                           SUBJECTIVE STATEMENT: Patient reports that she is feeling great. She went to [employer] yesterday and was able to hit the golf ball. She got approved for 4 more workers comp appointments. Wants to improve her strength to be able to lift #25.   Hand dominance: Ambidextrous  PERTINENT HISTORY: Right shoulder arthroscopy 03/28/2023  PAIN:  Are you having pain? NPRS scale: 0 /10  Pain location: Right shoulder Pain description: Tight Aggravating factors: Reaching overhead or behind back Relieving factors: Heat, medication  PRECAUTIONS: None  PATIENT GOALS: Pain relief, be able to use the right arm, return to prior level of function   OBJECTIVE:  Note: Objective measures were completed at Evaluation unless otherwise noted. PATIENT SURVEYS:  FOTO 51% functional status  06/04/2023: 72%  POSTURE: Rounded shoulder posture,  she keeps right arm guarded by side  UPPER EXTREMITY ROM:   Active ROM Right eval Left eval Right 04/27/2023 Right 05/08/2023 Right 05/28/2023 Right 06/11/2023  Shoulder flexion 90 170 123 140 150  155 pre, 158 end of session  Shoulder extension 40 60      Shoulder abduction 70 180  135 130  170  Shoulder adduction        Shoulder internal rotation Sacrum T6  L5 L1  T10  Shoulder external rotation 65 / occiput 85 / T4   90 deg/ T6   Elbow flexion        Elbow  extension        Wrist flexion        Wrist extension        Wrist ulnar deviation        Wrist radial deviation        Wrist pronation        Wrist supination        (Blank rows = not tested)  Passive ROM Right eval Left eval  Shoulder flexion 150   Shoulder extension    Shoulder abduction 120   Shoulder adduction    Shoulder internal rotation 50 @ 90   Shoulder external rotation 70 @ 0 and 90   Elbow flexion    Elbow extension    Wrist flexion    Wrist extension    Wrist ulnar deviation    Wrist radial deviation    Wrist pronation    Wrist supination    (Blank rows = not tested)  UPPER EXTREMITY MMT:  MMT Right eval Left eval Right 06/11/2023  Shoulder flexion   4-  Shoulder extension     Shoulder abduction   4-   Shoulder adduction     Shoulder internal rotation   4  Shoulder external rotation   4-  Middle trapezius     Lower trapezius     Elbow flexion     Elbow extension     Wrist flexion     Wrist extension     Wrist ulnar deviation     Wrist radial deviation     Wrist pronation     Wrist supination     Grip strength (lbs)     (Blank rows = not tested)  Strength not assessed at eval  JOINT MOBILITY TESTING:  Hypomobility of right GHJ  PALPATION:  Generalized tenderness of right shoulder                                                                         TREATMENT TODAY: [facility] Adult PT Treatment:    DATE: 06/13/2023 UBE L1x4 min (fwd/bwd) while taking subjective  Standing flexion #2 through ROM to fatigue Standing abduction #2 through ROM to fatigue  Chest press #10 3x10 Seated low row #35 2x10  Cable D2 pattern #7 2x5 Cable ER #7 2x10  Tall plank at elevated table alternating shoulder tap 2x20  PATIENT EDUCATION: Education details: POC extension, HEP Person educated: Patient Education method: Explanation, Demonstration, Tactile cues, Verbal cues Education comprehension: verbalized understanding, returned demonstration, verbal cues  required, tactile cues required, and needs further education  HOME EXERCISE PROGRAM: Access Code: [email or web address]  ASSESSMENT:  CLINICAL IMPRESSION: Patient tolerated therapy well with no adverse effects. 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 Strength deficits notable, with a scaption compensation during abduction and a slight shoulder shrug past about 150 degrees. Continues to fatigue at 6-8 reps of any weighted scaption exercises. No changes made to HEP. Discussed POC and decided to schedule once a week for the next 4 weeks. We will progress weighted exercises and continue working on keeping form during fatigue. Will continue to work on keeping ROM at full range. Overall, patient will benefit from continued skilled PT to continue working on strength and ROM training to improve strength deficits and functional endurance.    EVAL: Patient is a 33 y.o. female who was seen today for physical therapy evaluation and treatment for right shoulder pain following arthroscopy on 03/28/2023. Currently she demonstrates limitations with her right shoulder motion, strength not assessed this visit due to pain. She was provided exercises to initiate shoulder mobility and light rotator cuff work with good tolerance.   OBJECTIVE IMPAIRMENTS: decreased activity tolerance, decreased ROM, decreased strength, impaired UE functional use, postural dysfunction, and pain.   ACTIVITY LIMITATIONS: carrying, lifting, sleeping, bathing, dressing, reach over head, and hygiene/grooming  PARTICIPATION LIMITATIONS: meal prep, cleaning, laundry, shopping, community activity, and occupation  PERSONAL FACTORS: Past/current experiences and Time since onset of injury/illness/exacerbation are also affecting patient's functional outcome.   GOALS: Goals reviewed with patient? Yes  SHORT TERM GOALS: Target date: 05/17/2023  Patient will be I with  initial HEP in order to progress with therapy. Baseline: HEP provided at eval 05/08/2023: independent with her initial HEP Goal status: MET  2.  Patient will report right shoulder pain </= 4/10 in order to reduce functional limitations Baseline: 7/10 05/08/2023: denies pain but reports tightness Goal status: MET  3.  Patient will demonstrate right shoulder active elevation >/= 120 deg in order to improve self care ability Baseline: 90 deg 05/08/2023: 140 deg Goal status: MET  LONG TERM GOALS: Target date: 07/13/2023  Patient will be I with final HEP to maintain progress from PT.  Baseline: HEP provided at eval 06/11/2023: progressing Goal status: ONGOING  2.  Patient will report >/= 65% status on FOTO to indicate improved functional ability. Baseline: 51% functional status 06/04/2023: 72% Goal status: MET  3.  Patient will demonstrate right shoulder active elevation >/= 160 deg to normalize overhead reach  Baseline: 90 deg 06/11/2023: 158  Goal status: IN PROGRESS  4.  Patient will be able to reach behind back to T10 in order to improve dressing and bathing ability Baseline: Sacrum 06/11/2023: T10  Goal status: MET  5. Patient will demonstrate right rotator cuff strength 5/5 MMT in order to improve lifting and carrying with the right arm  Basline: not assessed at eval  06/11/2023: 4-/5 MMT  Goal status: IN PROGRESS   PLAN: PT FREQUENCY: 1x/week  PT DURATION: 4 weeks  PLANNED INTERVENTIONS: 97164- PT Re-evaluation, 97110-Therapeutic exercises, 97530- Therapeutic activity, 97112- Neuromuscular re-education, 97535- Self Care, 08657- Manual therapy, [id number]- Aquatic Therapy, 97014- Electrical stimulation (unattended), [id number]- Electrical stimulation (manual), 97016- Vasopneumatic device, [id number]- Ionotophoresis 4mg /ml Dexamethasone, Patient/Family education, Balance training, Taping, Dry Needling, Joint mobilization, Joint manipulation, Spinal manipulation, Spinal mobilization,  Cryotherapy, and Moist heat  PLAN FOR NEXT SESSION: Review/revise HEP, continue to progress strengthening and functional endurance. Increase weight as tolerated.   Erin Hearing, Student-PT 06/13/2023, 9:02 AM

## 2023-06-18 ENCOUNTER — Encounter: Payer: Self-pay | Admitting: Physical Therapy

## 2023-06-18 ENCOUNTER — Other Ambulatory Visit: Payer: Self-pay

## 2023-06-18 ENCOUNTER — Ambulatory Visit: Payer: Worker's Compensation | Attending: Specialist | Admitting: Physical Therapy

## 2023-06-18 DIAGNOSIS — M25511 Pain in right shoulder: Secondary | ICD-10-CM | POA: Insufficient documentation

## 2023-06-18 DIAGNOSIS — G8929 Other chronic pain: Secondary | ICD-10-CM | POA: Insufficient documentation

## 2023-06-18 DIAGNOSIS — M6281 Muscle weakness (generalized): Secondary | ICD-10-CM | POA: Insufficient documentation

## 2023-06-18 NOTE — Therapy (Signed)
 OUTPATIENT PHYSICAL THERAPY TREATMENT   Patient Name: Brenda Mcmahon MRN: [id number] DOB:[birth year], 33 y.o., female Today's Date: 06/18/2023   END OF SESSION:  PT End of Session - 06/18/23 0830     Visit Number 16    Number of Visits 19    Date for PT Re-Evaluation 07/13/23    Authorization Type Workers [employer]    PT Start Time 0835    PT Stop Time 0930    PT Time Calculation (min) 55 min    Activity Tolerance Patient tolerated treatment well    Behavior During Therapy [facility] for tasks assessed/performed             Past Medical History:  Diagnosis Date   Achilles bursitis of right lower extremity 03/14/2017   Anxiety    Chronic right shoulder pain 03/16/2023   Depression 08/07/2021   Dysrhythmia    Encounter for other contraceptive management 08/07/2021   GAD (generalized anxiety disorder) 08/07/2021   Heart murmur    Moderate recurrent major depression (HCC) 08/07/2021   Need for DTaP vaccine 09/18/2021   Other insomnia 10/14/2022   Palpitation    Post-traumatic stress disorder, chronic 02/22/2023   Recurrent major depression-severe (HCC) 03/14/2017   Routine medical exam 09/18/2021   Past Surgical History:  Procedure Laterality Date   FOOT FRACTURE SURGERY Right 2015   also ankle fracture repaired.    SHOULDER ARTHROSCOPY WITH ROTATOR CUFF REPAIR AND SUBACROMIAL DECOMPRESSION Right 03/28/2023   Procedure: SHOULDER ARTHROSCOPY  AND SUBACROMIAL DECOMPRESSION DEBRIDEMENT;  Surgeon: Jene Every, MD;  Location: [facility];  Service: Orthopedics;  Laterality: Right;   WRIST FRACTURE SURGERY Left    33 yo.  fell through glass door.    Patient Active Problem List   Diagnosis Date Noted   SVT (supraventricular tachycardia) (HCC) 05/16/2023   Anxiety    Dysrhythmia    Heart murmur    Chronic right shoulder pain 03/16/2023   Post-traumatic stress disorder, chronic 02/22/2023   Other insomnia 10/14/2022   Routine medical exam 09/18/2021   Need for DTaP vaccine  09/18/2021   GAD (generalized anxiety disorder) 08/07/2021   Encounter for other contraceptive management 08/07/2021   Moderate recurrent major depression (HCC) 08/07/2021   Palpitation 11/10/2020   Achilles bursitis of right lower extremity 03/14/2017   Recurrent major depression-severe (HCC) 03/14/2017   PCP: Blane Ohara, MD  REFERRING PROVIDER: Jene Every, MD  REFERRING DIAG: Encounter for other specified surgical aftercare   THERAPY DIAG:  Chronic right shoulder pain  Muscle weakness (generalized)  Rationale for Evaluation and Treatment: Rehabilitation  ONSET DATE: 03/28/2023   SUBJECTIVE:                           SUBJECTIVE STATEMENT: Patient reports that her shoulder is good.   Hand dominance: Ambidextrous  PERTINENT HISTORY: Right shoulder arthroscopy 03/28/2023  PAIN:  Are you having pain? NPRS scale: 0 /10  Pain location: Right shoulder Pain description: Tight Aggravating factors: Reaching overhead or behind back Relieving factors: Heat, medication  PRECAUTIONS: None  PATIENT GOALS: Pain relief, be able to use the right arm, return to prior level of function    OBJECTIVE:  Note: Objective measures were completed at Evaluation unless otherwise noted. PATIENT SURVEYS:  FOTO 51% functional status  06/04/2023: 72%  POSTURE: Rounded shoulder posture, she keeps right arm guarded by side  UPPER EXTREMITY ROM:   Active ROM Right eval Left eval Right 04/27/2023 Right 05/08/2023 Right 05/28/2023  Right 06/11/2023  Shoulder flexion 90 170 123 140 150  155 pre, 158 end of session  Shoulder extension 40 60      Shoulder abduction 70 180  135 130  170  Shoulder adduction        Shoulder internal rotation Sacrum T6  L5 L1  T10  Shoulder external rotation 65 / occiput 85 / T4   90 deg/ T6   Elbow flexion        Elbow extension        Wrist flexion        Wrist extension        Wrist ulnar deviation        Wrist radial deviation        Wrist pronation         Wrist supination        (Blank rows = not tested)  Passive ROM Right eval Left eval  Shoulder flexion 150   Shoulder extension    Shoulder abduction 120   Shoulder adduction    Shoulder internal rotation 50 @ 90   Shoulder external rotation 70 @ 0 and 90   Elbow flexion    Elbow extension    Wrist flexion    Wrist extension    Wrist ulnar deviation    Wrist radial deviation    Wrist pronation    Wrist supination    (Blank rows = not tested)  UPPER EXTREMITY MMT:  MMT Right eval Left eval Right 06/11/2023  Shoulder flexion   4-  Shoulder extension     Shoulder abduction   4-   Shoulder adduction     Shoulder internal rotation   4  Shoulder external rotation   4-  Middle trapezius     Lower trapezius     Elbow flexion     Elbow extension     Wrist flexion     Wrist extension     Wrist ulnar deviation     Wrist radial deviation     Wrist pronation     Wrist supination     Grip strength (lbs)     (Blank rows = not tested)  Strength not assessed at eval  JOINT MOBILITY TESTING:  Hypomobility of right GHJ  PALPATION:  Generalized tenderness of right shoulder                                                                          TREATMENT TODAY: [facility] Adult PT Treatment:    DATE: 06/18/2023 UBE L5 x 4 min (fwd/bwd) to improve UE endurance and workload capacity Standing ER at neutral with blue 3 x 15 Low row machine 55# 3 x 8 Seated overhead shoulder press with 5# 4 x 8 Prone on stability ball T and Y with 1# 2 x 10 each Bear crawl hold with hands on BOSU 4 x 20 sec Supine D2 against manual resistance 3 x 10 Waiters carry with bottom-up 5# KB 4 x 20 sec  PATIENT EDUCATION: Education details: HEP, continue gym strengthening Person educated: Patient Education method: Explanation, Demonstration, Tactile cues, Verbal cues Education comprehension: verbalized understanding, returned demonstration, verbal cues required, tactile cues required, and needs  further education  HOME EXERCISE PROGRAM:  Access Code: [email or web address]   ASSESSMENT: CLINICAL IMPRESSION: Patient tolerated therapy well with no adverse effects. Therapy focused on continued strengthening, postural control, endurance training for the right shoulder with good tolerance. She does report the feeling of tightness and muscular fatigue with overhead exercises. She requires cueing for posture and technique throughout therapy. She was able to progress weight and resistance with all shoulder exercises and progressed to more weight bearing through the shoulders with good tolerance. No changes specifically made to HEP, but patient encouraged to continue progressing shoulder strengthening at gym. Patient would benefit from continued skilled PT to progress her strength in order to maximize functional ability.   EVAL: Patient is a 33 y.o. female who was seen today for physical therapy evaluation and treatment for right shoulder pain following arthroscopy on 03/28/2023. Currently she demonstrates limitations with her right shoulder motion, strength not assessed this visit due to pain. She was provided exercises to initiate shoulder mobility and light rotator cuff work with good tolerance.   OBJECTIVE IMPAIRMENTS: decreased activity tolerance, decreased ROM, decreased strength, impaired UE functional use, postural dysfunction, and pain.   ACTIVITY LIMITATIONS: carrying, lifting, sleeping, bathing, dressing, reach over head, and hygiene/grooming  PARTICIPATION LIMITATIONS: meal prep, cleaning, laundry, shopping, community activity, and occupation  PERSONAL FACTORS: Past/current experiences and Time since onset of injury/illness/exacerbation are also affecting patient's functional outcome.    GOALS: Goals reviewed with patient? Yes  SHORT TERM GOALS: Target date: 05/17/2023  Patient will be I with initial HEP in order to progress with therapy. Baseline: HEP provided at eval 05/08/2023: independent  with her initial HEP Goal status: MET  2.  Patient will report right shoulder pain </= 4/10 in order to reduce functional limitations Baseline: 7/10 05/08/2023: denies pain but reports tightness Goal status: MET  3.  Patient will demonstrate right shoulder active elevation >/= 120 deg in order to improve self care ability Baseline: 90 deg 05/08/2023: 140 deg Goal status: MET  LONG TERM GOALS: Target date: 07/13/2023  Patient will be I with final HEP to maintain progress from PT.  Baseline: HEP provided at eval 06/11/2023: progressing Goal status: ONGOING  2.  Patient will report >/= 65% status on FOTO to indicate improved functional ability. Baseline: 51% functional status 06/04/2023: 72% Goal status: MET  3.  Patient will demonstrate right shoulder active elevation >/= 160 deg to normalize overhead reach  Baseline: 90 deg 06/11/2023: 158  Goal status: IN PROGRESS  4.  Patient will be able to reach behind back to T10 in order to improve dressing and bathing ability Baseline: Sacrum 06/11/2023: T10  Goal status: MET  5. Patient will demonstrate right rotator cuff strength 5/5 MMT in order to improve lifting and carrying with the right arm  Basline: not assessed at eval  06/11/2023: 4-/5 MMT  Goal status: IN PROGRESS   PLAN: PT FREQUENCY: 1x/week  PT DURATION: 4 weeks  PLANNED INTERVENTIONS: 97164- PT Re-evaluation, 97110-Therapeutic exercises, 97530- Therapeutic activity, 97112- Neuromuscular re-education, 97535- Self Care, [id number]- Manual therapy, [id number]- Aquatic Therapy, 97014- Electrical stimulation (unattended), [id number]- Electrical stimulation (manual), 97016- Vasopneumatic device, [id number]- Ionotophoresis 4mg /ml Dexamethasone, Patient/Family education, Balance training, Taping, Dry Needling, Joint mobilization, Joint manipulation, Spinal manipulation, Spinal mobilization, Cryotherapy, and Moist heat  PLAN FOR NEXT SESSION: Review/revise HEP, continue to progress strengthening and  functional endurance. Increase weight as tolerated.    Rosana Hoes, PT, DPT, LAT, ATC 06/18/23  9:38 AM Phone: [phone] Fax: [phone]

## 2023-07-02 ENCOUNTER — Encounter: Payer: Self-pay | Admitting: Physical Therapy

## 2023-07-02 ENCOUNTER — Ambulatory Visit: Payer: Worker's Compensation | Attending: Specialist | Admitting: Physical Therapy

## 2023-07-02 ENCOUNTER — Other Ambulatory Visit: Payer: Self-pay

## 2023-07-02 DIAGNOSIS — M6281 Muscle weakness (generalized): Secondary | ICD-10-CM | POA: Insufficient documentation

## 2023-07-02 DIAGNOSIS — M25511 Pain in right shoulder: Secondary | ICD-10-CM | POA: Insufficient documentation

## 2023-07-02 DIAGNOSIS — G8929 Other chronic pain: Secondary | ICD-10-CM | POA: Diagnosis present

## 2023-07-02 NOTE — Therapy (Signed)
 OUTPATIENT PHYSICAL THERAPY TREATMENT   Patient Name: Brenda Mcmahon MRN: [id number] DOB:1990-05-13, 33 y.o., female Today's Date: 07/02/2023   END OF SESSION:  PT End of Session - 07/02/23 0757     Visit Number 17    Number of Visits 19    Date for PT Re-Evaluation 07/13/23    Authorization Type Workers [employer]    PT Start Time 0800    PT Stop Time 0845    PT Time Calculation (min) 45 min    Activity Tolerance Patient tolerated treatment well    Behavior During Therapy WFL for tasks assessed/performed              Past Medical History:  Diagnosis Date   Achilles bursitis of right lower extremity 03/14/2017   Anxiety    Chronic right shoulder pain 03/16/2023   Depression 08/07/2021   Dysrhythmia    Encounter for other contraceptive management 08/07/2021   GAD (generalized anxiety disorder) 08/07/2021   Heart murmur    Moderate recurrent major depression (HCC) 08/07/2021   Need for DTaP vaccine 09/18/2021   Other insomnia 10/14/2022   Palpitation    Post-traumatic stress disorder, chronic 02/22/2023   Recurrent major depression-severe (HCC) 03/14/2017   Routine medical exam 09/18/2021   Past Surgical History:  Procedure Laterality Date   FOOT FRACTURE SURGERY Right 2015   also ankle fracture repaired.    SHOULDER ARTHROSCOPY WITH ROTATOR CUFF REPAIR AND SUBACROMIAL DECOMPRESSION Right 03/28/2023   Procedure: SHOULDER ARTHROSCOPY  AND SUBACROMIAL DECOMPRESSION DEBRIDEMENT;  Surgeon: Jene Every, MD;  Location: [facility];  Service: Orthopedics;  Laterality: Right;   WRIST FRACTURE SURGERY Left    33 yo.  fell through glass door.    Patient Active Problem List   Diagnosis Date Noted   SVT (supraventricular tachycardia) (HCC) 05/16/2023   Anxiety    Dysrhythmia    Heart murmur    Chronic right shoulder pain 03/16/2023   Post-traumatic stress disorder, chronic 02/22/2023   Other insomnia 10/14/2022   Routine medical exam 09/18/2021   Need for DTaP vaccine  09/18/2021   GAD (generalized anxiety disorder) 08/07/2021   Encounter for other contraceptive management 08/07/2021   Moderate recurrent major depression (HCC) 08/07/2021   Palpitation 11/10/2020   Achilles bursitis of right lower extremity 03/14/2017   Recurrent major depression-severe (HCC) 03/14/2017   PCP: Blane Ohara, MD  REFERRING PROVIDER: Jene Every, MD  REFERRING DIAG: Encounter for other specified surgical aftercare   THERAPY DIAG:  Chronic right shoulder pain  Muscle weakness (generalized)  Rationale for Evaluation and Treatment: Rehabilitation  ONSET DATE: 03/28/2023   SUBJECTIVE:                           SUBJECTIVE STATEMENT: Patient reports her shoulder bothered her once while on vacation and feels like it was more the cold weather that affected it. Otherwise she has been fine.   Hand dominance: Ambidextrous  PERTINENT HISTORY: Right shoulder arthroscopy 03/28/2023  PAIN:  Are you having pain? NPRS scale: 0 /10  Pain location: Right shoulder Pain description: Tight Aggravating factors: Reaching overhead or behind back Relieving factors: Heat, medication  PRECAUTIONS: None  PATIENT GOALS: Pain relief, be able to use the right arm, return to prior level of function    OBJECTIVE:  Note: Objective measures were completed at Evaluation unless otherwise noted. PATIENT SURVEYS:  FOTO 51% functional status  06/04/2023: 72%  POSTURE: Rounded shoulder posture, she keeps right arm  guarded by side  UPPER EXTREMITY ROM:   Active ROM Right eval Left eval Right 04/27/2023 Right 05/08/2023 Right 05/28/2023 Right 06/11/2023  Shoulder flexion 90 170 123 140 150  155 pre, 158 end of session  Shoulder extension 40 60      Shoulder abduction 70 180  135 130  170  Shoulder adduction        Shoulder internal rotation Sacrum T6  L5 L1  T10  Shoulder external rotation 65 / occiput 85 / T4   90 deg/ T6   Elbow flexion        Elbow extension        Wrist  flexion        Wrist extension        Wrist ulnar deviation        Wrist radial deviation        Wrist pronation        Wrist supination        (Blank rows = not tested)  Passive ROM Right eval Left eval  Shoulder flexion 150   Shoulder extension    Shoulder abduction 120   Shoulder adduction    Shoulder internal rotation 50 @ 90   Shoulder external rotation 70 @ 0 and 90   Elbow flexion    Elbow extension    Wrist flexion    Wrist extension    Wrist ulnar deviation    Wrist radial deviation    Wrist pronation    Wrist supination    (Blank rows = not tested)  UPPER EXTREMITY MMT:  MMT Right eval Left eval Right 06/11/2023  Shoulder flexion   4-  Shoulder extension     Shoulder abduction   4-   Shoulder adduction     Shoulder internal rotation   4  Shoulder external rotation   4-  Middle trapezius     Lower trapezius     Elbow flexion     Elbow extension     Wrist flexion     Wrist extension     Wrist ulnar deviation     Wrist radial deviation     Wrist pronation     Wrist supination     Grip strength (lbs)     (Blank rows = not tested)  Strength not assessed at eval  JOINT MOBILITY TESTING:  Hypomobility of right GHJ  PALPATION:  Generalized tenderness of right shoulder                                                                          TREATMENT TODAY: [facility] Adult PT Treatment:    DATE: 07/02/2023 Standing banded ER / IR with green 2 x 15 each Low row machine 55# 3 x 8 Seated overhead shoulder press with 10# 4 x 6 Lat pull down machine 25# 3 x 8 1/2 kneeling landmine barbell press 3 x 10 Standing bent over reverse fly with 5# 2 x 10 Bear crawl hold with hands on BOSU lateral taps 4 x 20 sec Tall plank on BOSU x 15 sec Supine chest press with 15# 3 x 6  PATIENT EDUCATION: Education details: HEP Person educated: Patient Education method: Explanation, Demonstration, Actor cues, Verbal cues Education comprehension:  verbalized  understanding, returned demonstration, verbal cues required, tactile cues required, and needs further education  HOME EXERCISE PROGRAM: Access Code: [email or web address]   ASSESSMENT: CLINICAL IMPRESSION: Patient tolerated therapy well with no adverse effects. Therapy focused on progressing right shoulder strength and control with good tolerance. She was able to progress with her strengthening and overhead pressing with good tolerance. She does exhibit greater difficulty and reports weakness and fatigue of the right shoulder with exercises. No changes specifically made to HEP, but patient encouraged to continue progressing shoulder strengthening at gym and instructed on overhead pressing. Patient would benefit from continued skilled PT to progress her strength in order to maximize functional ability.   EVAL: Patient is a 33 y.o. female who was seen today for physical therapy evaluation and treatment for right shoulder pain following arthroscopy on 03/28/2023. Currently she demonstrates limitations with her right shoulder motion, strength not assessed this visit due to pain. She was provided exercises to initiate shoulder mobility and light rotator cuff work with good tolerance.   OBJECTIVE IMPAIRMENTS: decreased activity tolerance, decreased ROM, decreased strength, impaired UE functional use, postural dysfunction, and pain.   ACTIVITY LIMITATIONS: carrying, lifting, sleeping, bathing, dressing, reach over head, and hygiene/grooming  PARTICIPATION LIMITATIONS: meal prep, cleaning, laundry, shopping, community activity, and occupation  PERSONAL FACTORS: Past/current experiences and Time since onset of injury/illness/exacerbation are also affecting patient's functional outcome.    GOALS: Goals reviewed with patient? Yes  SHORT TERM GOALS: Target date: 05/17/2023  Patient will be I with initial HEP in order to progress with therapy. Baseline: HEP provided at eval 05/08/2023: independent with her initial  HEP Goal status: MET  2.  Patient will report right shoulder pain </= 4/10 in order to reduce functional limitations Baseline: 7/10 05/08/2023: denies pain but reports tightness Goal status: MET  3.  Patient will demonstrate right shoulder active elevation >/= 120 deg in order to improve self care ability Baseline: 90 deg 05/08/2023: 140 deg Goal status: MET  LONG TERM GOALS: Target date: 07/13/2023  Patient will be I with final HEP to maintain progress from PT.  Baseline: HEP provided at eval 06/11/2023: progressing Goal status: ONGOING  2.  Patient will report >/= 65% status on FOTO to indicate improved functional ability. Baseline: 51% functional status 06/04/2023: 72% Goal status: MET  3.  Patient will demonstrate right shoulder active elevation >/= 160 deg to normalize overhead reach  Baseline: 90 deg 06/11/2023: 158  Goal status: IN PROGRESS  4.  Patient will be able to reach behind back to T10 in order to improve dressing and bathing ability Baseline: Sacrum 06/11/2023: T10  Goal status: MET  5. Patient will demonstrate right rotator cuff strength 5/5 MMT in order to improve lifting and carrying with the right arm  Basline: not assessed at eval  06/11/2023: 4-/5 MMT  Goal status: IN PROGRESS   PLAN: PT FREQUENCY: 1x/week  PT DURATION: 4 weeks  PLANNED INTERVENTIONS: 97164- PT Re-evaluation, 97110-Therapeutic exercises, 97530- Therapeutic activity, 97112- Neuromuscular re-education, 97535- Self Care, 16109- Manual therapy, [id number]- Aquatic Therapy, 97014- Electrical stimulation (unattended), [id number]- Electrical stimulation (manual), 97016- Vasopneumatic device, [id number]- Ionotophoresis 4mg /ml Dexamethasone, Patient/Family education, Balance training, Taping, Dry Needling, Joint mobilization, Joint manipulation, Spinal manipulation, Spinal mobilization, Cryotherapy, and Moist heat  PLAN FOR NEXT SESSION: Review/revise HEP, continue to progress strengthening and functional  endurance. Increase weight as tolerated.    Rosana Hoes, PT, DPT, LAT, ATC 07/02/23  8:56 AM Phone: [phone] Fax: [phone]

## 2023-07-09 ENCOUNTER — Encounter: Payer: Self-pay | Admitting: Physical Therapy

## 2023-07-09 ENCOUNTER — Other Ambulatory Visit: Payer: Self-pay

## 2023-07-09 ENCOUNTER — Ambulatory Visit: Payer: Worker's Compensation | Admitting: Physical Therapy

## 2023-07-09 DIAGNOSIS — G8929 Other chronic pain: Secondary | ICD-10-CM

## 2023-07-09 DIAGNOSIS — M6281 Muscle weakness (generalized): Secondary | ICD-10-CM

## 2023-07-09 DIAGNOSIS — M25511 Pain in right shoulder: Secondary | ICD-10-CM | POA: Diagnosis not present

## 2023-07-09 NOTE — Therapy (Signed)
 OUTPATIENT PHYSICAL THERAPY TREATMENT   Patient Name: Brenda Mcmahon MRN: [id number] DOB:1990/06/21, 33 y.o., female Today's Date: 07/09/2023   END OF SESSION:  PT End of Session - 07/09/23 0808     Visit Number 18    Number of Visits 19    Date for PT Re-Evaluation 07/13/23    Authorization Type [employer]    PT Start Time 0800    PT Stop Time 0845    PT Time Calculation (min) 45 min    Activity Tolerance Patient tolerated treatment well    Behavior During Therapy WFL for tasks assessed/performed               Past Medical History:  Diagnosis Date   Achilles bursitis of right lower extremity 03/14/2017   Anxiety    Chronic right shoulder pain 03/16/2023   Depression 08/07/2021   Dysrhythmia    Encounter for other contraceptive management 08/07/2021   GAD (generalized anxiety disorder) 08/07/2021   Heart murmur    Moderate recurrent major depression (HCC) 08/07/2021   Need for DTaP vaccine 09/18/2021   Other insomnia 10/14/2022   Palpitation    Post-traumatic stress disorder, chronic 02/22/2023   Recurrent major depression-severe (HCC) 03/14/2017   Routine medical exam 09/18/2021   Past Surgical History:  Procedure Laterality Date   FOOT FRACTURE SURGERY Right 2015   also ankle fracture repaired.    SHOULDER ARTHROSCOPY WITH ROTATOR CUFF REPAIR AND SUBACROMIAL DECOMPRESSION Right 03/28/2023   Procedure: SHOULDER ARTHROSCOPY  AND SUBACROMIAL DECOMPRESSION DEBRIDEMENT;  Surgeon: Jene Every, MD;  Location: [facility];  Service: Orthopedics;  Laterality: Right;   WRIST FRACTURE SURGERY Left    33 yo.  fell through glass door.    Patient Active Problem List   Diagnosis Date Noted   SVT (supraventricular tachycardia) (HCC) 05/16/2023   Anxiety    Dysrhythmia    Heart murmur    Chronic right shoulder pain 03/16/2023   Post-traumatic stress disorder, chronic 02/22/2023   Other insomnia 10/14/2022   Routine medical exam 09/18/2021   Need for DTaP vaccine  09/18/2021   GAD (generalized anxiety disorder) 08/07/2021   Encounter for other contraceptive management 08/07/2021   Moderate recurrent major depression (HCC) 08/07/2021   Palpitation 11/10/2020   Achilles bursitis of right lower extremity 03/14/2017   Recurrent major depression-severe (HCC) 03/14/2017   PCP: Blane Ohara, MD  REFERRING PROVIDER: Jene Every, MD  REFERRING DIAG: Encounter for other specified surgical aftercare   THERAPY DIAG:  Chronic right shoulder pain  Muscle weakness (generalized)  Rationale for Evaluation and Treatment: Rehabilitation  ONSET DATE: 03/28/2023   SUBJECTIVE:                           SUBJECTIVE STATEMENT: Patient reports her shoulder bothered her once while on vacation and feels like it was more the cold weather that affected it. Otherwise she has been fine.   Hand dominance: Ambidextrous  PERTINENT HISTORY: Right shoulder arthroscopy 03/28/2023  PAIN:  Are you having pain? NPRS scale: 0 /10  Pain location: Right shoulder Pain description: Tight Aggravating factors: Reaching overhead or behind back Relieving factors: Heat, medication  PRECAUTIONS: None  PATIENT GOALS: Pain relief, be able to use the right arm, return to prior level of function    OBJECTIVE:  Note: Objective measures were completed at Evaluation unless otherwise noted. PATIENT SURVEYS:  FOTO 51% functional status  06/04/2023: 72%  POSTURE: Rounded shoulder posture, she keeps right  arm guarded by side  UPPER EXTREMITY ROM:   Active ROM Right eval Left eval Right 04/27/2023 Right 05/08/2023 Right 05/28/2023 Right 06/11/2023  Shoulder flexion 90 170 123 140 150  155 pre, 158 end of session  Shoulder extension 40 60      Shoulder abduction 70 180  135 130  170  Shoulder adduction        Shoulder internal rotation Sacrum T6  L5 L1  T10  Shoulder external rotation 65 / occiput 85 / T4   90 deg/ T6   Elbow flexion        Elbow extension        Wrist  flexion        Wrist extension        Wrist ulnar deviation        Wrist radial deviation        Wrist pronation        Wrist supination        (Blank rows = not tested)  Passive ROM Right eval Left eval  Shoulder flexion 150   Shoulder extension    Shoulder abduction 120   Shoulder adduction    Shoulder internal rotation 50 @ 90   Shoulder external rotation 70 @ 0 and 90   Elbow flexion    Elbow extension    Wrist flexion    Wrist extension    Wrist ulnar deviation    Wrist radial deviation    Wrist pronation    Wrist supination    (Blank rows = not tested)  UPPER EXTREMITY MMT:  MMT Right eval Left eval Right 06/11/2023  Shoulder flexion   4-  Shoulder extension     Shoulder abduction   4-   Shoulder adduction     Shoulder internal rotation   4  Shoulder external rotation   4-  Middle trapezius     Lower trapezius     Elbow flexion     Elbow extension     Wrist flexion     Wrist extension     Wrist ulnar deviation     Wrist radial deviation     Wrist pronation     Wrist supination     Grip strength (lbs)     (Blank rows = not tested)  Strength not assessed at eval  JOINT MOBILITY TESTING:  Hypomobility of right GHJ  PALPATION:  Generalized tenderness of right shoulder                                                                          TREATMENT TODAY: [facility] Adult PT Treatment:    DATE: 07/09/2023 UBE L3 x 3 min (fwd/bwd) to improve endurance Standing banded ER / IR with green 2 x 15 each Seated overhead shoulder press with 10# 4 x 6 Bent over row with 25# 3 x 8 Supine chest press with 15# 3 x 6 1/2 kneeling landmine barbell press 3 x 8 Tall plank on BOSU 3 x 15 sec  PATIENT EDUCATION: Education details: HEP Person educated: Patient Education method: Explanation, Demonstration, Actor cues, Verbal cues Education comprehension: verbalized understanding, returned demonstration, verbal cues required, tactile cues required, and needs further  education  HOME EXERCISE PROGRAM:  Access Code: [email or web address]   ASSESSMENT: CLINICAL IMPRESSION: Patient tolerated therapy well with no adverse effects. Therapy focused on strengthening for the right rotator cuff and progression of overhead strengthening. She does continue to report feeling of weakness of the right shoulder especially with overhead pressing. No changes made to HEP. Patient would benefit from continued skilled PT to progress her strength in order to maximize functional ability.   EVAL: Patient is a 33 y.o. female who was seen today for physical therapy evaluation and treatment for right shoulder pain following arthroscopy on 03/28/2023. Currently she demonstrates limitations with her right shoulder motion, strength not assessed this visit due to pain. She was provided exercises to initiate shoulder mobility and light rotator cuff work with good tolerance.   OBJECTIVE IMPAIRMENTS: decreased activity tolerance, decreased ROM, decreased strength, impaired UE functional use, postural dysfunction, and pain.   ACTIVITY LIMITATIONS: carrying, lifting, sleeping, bathing, dressing, reach over head, and hygiene/grooming  PARTICIPATION LIMITATIONS: meal prep, cleaning, laundry, shopping, community activity, and occupation  PERSONAL FACTORS: Past/current experiences and Time since onset of injury/illness/exacerbation are also affecting patient's functional outcome.    GOALS: Goals reviewed with patient? Yes  SHORT TERM GOALS: Target date: 05/17/2023  Patient will be I with initial HEP in order to progress with therapy. Baseline: HEP provided at eval 05/08/2023: independent with her initial HEP Goal status: MET  2.  Patient will report right shoulder pain </= 4/10 in order to reduce functional limitations Baseline: 7/10 05/08/2023: denies pain but reports tightness Goal status: MET  3.  Patient will demonstrate right shoulder active elevation >/= 120 deg in order to improve self care  ability Baseline: 90 deg 05/08/2023: 140 deg Goal status: MET  LONG TERM GOALS: Target date: 07/13/2023  Patient will be I with final HEP to maintain progress from PT.  Baseline: HEP provided at eval 06/11/2023: progressing Goal status: ONGOING  2.  Patient will report >/= 65% status on FOTO to indicate improved functional ability. Baseline: 51% functional status 06/04/2023: 72% Goal status: MET  3.  Patient will demonstrate right shoulder active elevation >/= 160 deg to normalize overhead reach  Baseline: 90 deg 06/11/2023: 158  Goal status: IN PROGRESS  4.  Patient will be able to reach behind back to T10 in order to improve dressing and bathing ability Baseline: Sacrum 06/11/2023: T10  Goal status: MET  5. Patient will demonstrate right rotator cuff strength 5/5 MMT in order to improve lifting and carrying with the right arm  Basline: not assessed at eval  06/11/2023: 4-/5 MMT  Goal status: IN PROGRESS   PLAN: PT FREQUENCY: 1x/week  PT DURATION: 4 weeks  PLANNED INTERVENTIONS: 97164- PT Re-evaluation, 97110-Therapeutic exercises, 97530- Therapeutic activity, 97112- Neuromuscular re-education, 97535- Self Care, 54098- Manual therapy, [id number]- Aquatic Therapy, 97014- Electrical stimulation (unattended), [id number]- Electrical stimulation (manual), 97016- Vasopneumatic device, 97033- Ionotophoresis 4mg /ml Dexamethasone, Patient/Family education, Balance training, Taping, Dry Needling, Joint mobilization, Joint manipulation, Spinal manipulation, Spinal mobilization, Cryotherapy, and Moist heat  PLAN FOR NEXT SESSION: Review/revise HEP, continue to progress strengthening and functional endurance. Increase weight as tolerated.    Rosana Hoes, PT, DPT, LAT, ATC 07/09/23  9:13 AM Phone: [phone] Fax: [phone]

## 2023-07-12 ENCOUNTER — Ambulatory Visit: Payer: Self-pay | Attending: Specialist | Admitting: Physical Therapy

## 2023-07-12 ENCOUNTER — Encounter: Payer: Self-pay | Admitting: Physical Therapy

## 2023-07-12 ENCOUNTER — Other Ambulatory Visit: Payer: Self-pay

## 2023-07-12 DIAGNOSIS — G8929 Other chronic pain: Secondary | ICD-10-CM | POA: Insufficient documentation

## 2023-07-12 DIAGNOSIS — M6281 Muscle weakness (generalized): Secondary | ICD-10-CM | POA: Insufficient documentation

## 2023-07-12 DIAGNOSIS — M25511 Pain in right shoulder: Secondary | ICD-10-CM | POA: Insufficient documentation

## 2023-07-12 NOTE — Therapy (Signed)
 OUTPATIENT PHYSICAL THERAPY TREATMENT  DISCHARGE   Patient Name: Brenda Mcmahon MRN: [id number] DOB:[birth year], 33 y.o., female Today's Date: 07/12/2023   END OF SESSION:  PT End of Session - 07/12/23 0838     Visit Number 19    Number of Visits 19    Date for PT Re-Evaluation 07/13/23    Authorization Type Workers Comp    PT Start Time 0845    PT Stop Time 0930    PT Time Calculation (min) 45 min    Activity Tolerance Patient tolerated treatment well    Behavior During Therapy [facility] for tasks assessed/performed                Past Medical History:  Diagnosis Date   Achilles bursitis of right lower extremity 03/14/2017   Anxiety    Chronic right shoulder pain 03/16/2023   Depression 08/07/2021   Dysrhythmia    Encounter for other contraceptive management 08/07/2021   GAD (generalized anxiety disorder) 08/07/2021   Heart murmur    Moderate recurrent major depression (HCC) 08/07/2021   Need for DTaP vaccine 09/18/2021   Other insomnia 10/14/2022   Palpitation    Post-traumatic stress disorder, chronic 02/22/2023   Recurrent major depression-severe (HCC) 03/14/2017   Routine medical exam 09/18/2021   Past Surgical History:  Procedure Laterality Date   FOOT FRACTURE SURGERY Right 2015   also ankle fracture repaired.    SHOULDER ARTHROSCOPY WITH ROTATOR CUFF REPAIR AND SUBACROMIAL DECOMPRESSION Right 03/28/2023   Procedure: SHOULDER ARTHROSCOPY  AND SUBACROMIAL DECOMPRESSION DEBRIDEMENT;  Surgeon: Jene Every, MD;  Location: [facility];  Service: Orthopedics;  Laterality: Right;   WRIST FRACTURE SURGERY Left    33 yo.  fell through glass door.    Patient Active Problem List   Diagnosis Date Noted   SVT (supraventricular tachycardia) (HCC) 05/16/2023   Anxiety    Dysrhythmia    Heart murmur    Chronic right shoulder pain 03/16/2023   Post-traumatic stress disorder, chronic 02/22/2023   Other insomnia 10/14/2022   Routine medical exam 09/18/2021   Need for  DTaP vaccine 09/18/2021   GAD (generalized anxiety disorder) 08/07/2021   Encounter for other contraceptive management 08/07/2021   Moderate recurrent major depression (HCC) 08/07/2021   Palpitation 11/10/2020   Achilles bursitis of right lower extremity 03/14/2017   Recurrent major depression-severe (HCC) 03/14/2017   PCP: Blane Ohara, MD  REFERRING PROVIDER: Jene Every, MD  REFERRING DIAG: Encounter for other specified surgical aftercare   THERAPY DIAG:  Chronic right shoulder pain  Muscle weakness (generalized)  Rationale for Evaluation and Treatment: Rehabilitation  ONSET DATE: 03/28/2023   SUBJECTIVE:                           SUBJECTIVE STATEMENT: Patient reports no pain in the shoulder. She still doesn't feel the right shoulder is as strong as the left but it doesn't limit her activities or work.  Hand dominance: Ambidextrous  PERTINENT HISTORY: Right shoulder arthroscopy 03/28/2023  PAIN:  Are you having pain? NPRS scale: 0 /10  Pain location: Right shoulder Pain description: Tight Aggravating factors: Reaching overhead or behind back Relieving factors: Heat, medication  PRECAUTIONS: None  PATIENT GOALS: Pain relief, be able to use the right arm, return to prior level of function    OBJECTIVE:  Note: Objective measures were completed at Evaluation unless otherwise noted. PATIENT SURVEYS:  FOTO 51% functional status  06/04/2023: 72%  POSTURE: Rounded shoulder posture,  she keeps right arm guarded by side  UPPER EXTREMITY ROM:   Active ROM Right eval Left eval Right 04/27/2023 Right 05/08/2023 Right 05/28/2023 Right 06/11/2023 Right 07/12/2023  Shoulder flexion 90 170 123 140 150  155 pre, 158 end of session 160  Shoulder extension 40 60       Shoulder abduction 70 180  135 130  170   Shoulder adduction         Shoulder internal rotation Sacrum T6  L5 L1  T10   Shoulder external rotation 65 / occiput 85 / T4   90 deg/ T6    Elbow flexion          Elbow extension         Wrist flexion         Wrist extension         Wrist ulnar deviation         Wrist radial deviation         Wrist pronation         Wrist supination         (Blank rows = not tested)  Passive ROM Right eval Left eval  Shoulder flexion 150   Shoulder extension    Shoulder abduction 120   Shoulder adduction    Shoulder internal rotation 50 @ 90   Shoulder external rotation 70 @ 0 and 90   Elbow flexion    Elbow extension    Wrist flexion    Wrist extension    Wrist ulnar deviation    Wrist radial deviation    Wrist pronation    Wrist supination    (Blank rows = not tested)  UPPER EXTREMITY MMT:  MMT Right eval Left eval Right 06/11/2023 Right 07/12/2023  Shoulder flexion   4- 5  Shoulder extension      Shoulder abduction   4-  5  Shoulder adduction      Shoulder internal rotation   4 5  Shoulder external rotation   4- 5  Middle trapezius      Lower trapezius      Elbow flexion      Elbow extension      Wrist flexion      Wrist extension      Wrist ulnar deviation      Wrist radial deviation      Wrist pronation      Wrist supination      Grip strength (lbs)      (Blank rows = not tested)  Strength not assessed at eval  JOINT MOBILITY TESTING:  Hypomobility of right GHJ  PALPATION:  Generalized tenderness of right shoulder                                                                          TREATMENT TODAY: [facility] Adult PT Treatment:    DATE: 07/11/2023 UBE L5 x 4 min (fwd/bwd) to improve endurance Standing banded ER / IR at side with blue 2 x 15 each Bent over horizontal abduction with 5# 2 x 10 Bent over row with 25# 2 x 10 each Standing scaption with 3# 2 x 10 Standing abduction palm down with 3# 2 x 10 Seated overhead shoulder press with  10# 3 x 6 Standing high row with yellow x 10 each Standing ER at 90 deg with yellow x 10 each Supine chest press with 15# 3 x 6 Tall plank at mat table with alternating shoulder taps  2 x 10 Bottom-up KB carry x 3 laps  PATIENT EDUCATION: Education details: POC discharge, HEP update Person educated: Patient Education method: Programmer, multimedia, Demonstration, Handout Education comprehension: verbalized understanding, returned demonstration  HOME EXERCISE PROGRAM: Access Code: [email or web address]   ASSESSMENT: CLINICAL IMPRESSION: Patient tolerated therapy well with no adverse effects. She has made great progress in PT demonstrating improvement in her right shoulder motion, strength, pain with activity, and functional ability. Session focused on finalizing HEP focused on strengthening for home and gym. She was able to complete all exercises with good form and no report of pain with therapy. She does report some residual strength deficits compared to the right but this should improve with exercise over time. Patient will be formally discharged as she has achieved all established goals.   EVAL: Patient is a 33 y.o. female who was seen today for physical therapy evaluation and treatment for right shoulder pain following arthroscopy on 03/28/2023. Currently she demonstrates limitations with her right shoulder motion, strength not assessed this visit due to pain. She was provided exercises to initiate shoulder mobility and light rotator cuff work with good tolerance.   OBJECTIVE IMPAIRMENTS: decreased activity tolerance, decreased ROM, decreased strength, impaired UE functional use, postural dysfunction, and pain.   ACTIVITY LIMITATIONS: carrying, lifting, sleeping, bathing, dressing, reach over head, and hygiene/grooming  PARTICIPATION LIMITATIONS: meal prep, cleaning, laundry, shopping, community activity, and occupation  PERSONAL FACTORS: Past/current experiences and Time since onset of injury/illness/exacerbation are also affecting patient's functional outcome.    GOALS: Goals reviewed with patient? Yes  SHORT TERM GOALS: Target date: 05/17/2023  Patient will be I with initial HEP in  order to progress with therapy. Baseline: HEP provided at eval 05/08/2023: independent with her initial HEP Goal status: MET  2.  Patient will report right shoulder pain </= 4/10 in order to reduce functional limitations Baseline: 7/10 05/08/2023: denies pain but reports tightness Goal status: MET  3.  Patient will demonstrate right shoulder active elevation >/= 120 deg in order to improve self care ability Baseline: 90 deg 05/08/2023: 140 deg Goal status: MET  LONG TERM GOALS: Target date: 07/13/2023  Patient will be I with final HEP to maintain progress from PT.  Baseline: HEP provided at eval 06/11/2023: progressing 07/12/2023: Goal status: MET  2.  Patient will report >/= 65% status on FOTO to indicate improved functional ability. Baseline: 51% functional status 06/04/2023: 72% Goal status: MET  3.  Patient will demonstrate right shoulder active elevation >/= 160 deg to normalize overhead reach  Baseline: 90 deg 06/11/2023: 158  07/12/2023: 160 Goal status: MET  4.  Patient will be able to reach behind back to T10 in order to improve dressing and bathing ability Baseline: Sacrum 06/11/2023: T10  Goal status: MET  5. Patient will demonstrate right rotator cuff strength 5/5 MMT in order to improve lifting and carrying with the right arm  Basline: not assessed at eval  06/11/2023: 4-/5 MMT 07/12/2023: 5/5  Goal status: MET   PLAN: PT FREQUENCY: 1x/week  PT DURATION: 4 weeks  PLANNED INTERVENTIONS: 97164- PT Re-evaluation, 97110-Therapeutic exercises, 97530- Therapeutic activity, 97112- Neuromuscular re-education, 97535- Self Care, 13086- Manual therapy, [id number]- Aquatic Therapy, 97014- Electrical stimulation (unattended), [id number]- Electrical stimulation (manual), 97016- Vasopneumatic device, [id number]- Ionotophoresis 4mg /ml Dexamethasone, Patient/Family  education, Balance training, Taping, Dry Needling, Joint mobilization, Joint manipulation, Spinal manipulation, Spinal mobilization,  Cryotherapy, and Moist heat  PLAN FOR NEXT SESSION: NA - discharge   Rosana Hoes, PT, DPT, LAT, ATC 07/12/23  9:36 AM Phone: [phone] Fax: [phone]   PHYSICAL THERAPY DISCHARGE SUMMARY  Visits from Start of Care: 19  Current functional level related to goals / functional outcomes: See above   Remaining deficits: See above   Education / Equipment: HEP   Patient agrees to discharge. Patient goals were met. Patient is being discharged due to meeting the stated rehab goals.

## 2023-07-12 NOTE — Patient Instructions (Signed)
 Access Code: [email or web address] URL: [email or web address] Date: 07/12/2023 Prepared by: Rosana Hoes  Exercises - Shoulder External Rotation with Anchored Resistance  - 3 sets - 15 reps - Shoulder Internal Rotation with Resistance  - 3 sets - 15 reps - Standing Row with Anchored Resistance  - 3 sets - 20 reps - Standing Row with Resistance with Anchored Resistance at Chest Height Palms Down  - 3 sets - 10 reps - Standing Single Arm Shoulder External Rotation in Abduction with Anchored Resistance  - 3 sets - 8-10 reps - Single Arm Scaption with Resistance  - 3 sets - 10 reps - Scaption with Dumbbells  - 3 sets - 10 reps - Shoulder Abduction with Dumbbells - Palms Down  - 3 sets - 8-10 reps - Shoulder Press on Swiss Ball  - 4 sets - 6-8 reps - Single Arm Bent Over Shoulder Horizontal Abduction with Dumbbell - Palm Down  - 3 sets - 10 reps - Bent Over Single Arm Shoulder Row with Dumbbell  - 3 sets - 8-10 reps - Supine Chest Press with Dumbbells  - 3 sets - 8-10 reps - Shoulder Taps on Table  - 1 x daily - 3 sets - 10 reps - Kettlebell Bottom Up Carry  - 1 x daily - 3 reps

## 2023-07-13 ENCOUNTER — Other Ambulatory Visit (HOSPITAL_COMMUNITY): Payer: Self-pay

## 2023-07-16 ENCOUNTER — Other Ambulatory Visit (HOSPITAL_COMMUNITY): Payer: Self-pay

## 2023-07-16 MED ORDER — ZOLPIDEM TARTRATE 5 MG PO TABS
5.0000 mg | ORAL_TABLET | Freq: Every evening | ORAL | 2 refills | Status: AC | PRN
  Filled 2023-07-16: qty 30, 30d supply, fill #0

## 2023-07-16 MED ORDER — DESVENLAFAXINE SUCCINATE ER 100 MG PO TB24: 100.0000 mg | ORAL_TABLET | Freq: Every day | ORAL | 0 refills | Status: DC

## 2023-07-26 ENCOUNTER — Other Ambulatory Visit (HOSPITAL_COMMUNITY): Payer: Self-pay

## 2023-08-29 ENCOUNTER — Other Ambulatory Visit (HOSPITAL_COMMUNITY): Payer: Self-pay

## 2023-10-12 ENCOUNTER — Other Ambulatory Visit (HOSPITAL_COMMUNITY): Payer: Self-pay

## 2023-10-12 MED ORDER — DESVENLAFAXINE SUCCINATE ER 100 MG PO TB24
100.0000 mg | ORAL_TABLET | Freq: Every day | ORAL | 0 refills | Status: DC
  Filled 2023-10-12: qty 30, 30d supply, fill #0

## 2023-10-12 MED ORDER — ZOLPIDEM TARTRATE 5 MG PO TABS
5.0000 mg | ORAL_TABLET | Freq: Every evening | ORAL | 2 refills | Status: DC | PRN
  Filled 2023-10-12: qty 30, 30d supply, fill #0

## 2023-10-23 ENCOUNTER — Other Ambulatory Visit (HOSPITAL_COMMUNITY): Payer: Self-pay

## 2023-10-29 ENCOUNTER — Ambulatory Visit: Payer: Self-pay | Admitting: Family Medicine

## 2023-11-12 ENCOUNTER — Ambulatory Visit: Payer: Commercial Managed Care - PPO | Admitting: Family Medicine

## 2023-11-15 ENCOUNTER — Encounter: Payer: Self-pay | Admitting: Family Medicine

## 2023-11-15 ENCOUNTER — Ambulatory Visit: Payer: PRIVATE HEALTH INSURANCE | Admitting: Family Medicine

## 2023-11-15 VITALS — BP 118/70 | HR 74 | Temp 98.3°F | Ht 62.0 in | Wt 139.0 lb

## 2023-11-15 DIAGNOSIS — F411 Generalized anxiety disorder: Secondary | ICD-10-CM | POA: Diagnosis not present

## 2023-11-15 DIAGNOSIS — R002 Palpitations: Secondary | ICD-10-CM | POA: Diagnosis not present

## 2023-11-15 DIAGNOSIS — F331 Major depressive disorder, recurrent, moderate: Secondary | ICD-10-CM | POA: Diagnosis not present

## 2023-11-15 DIAGNOSIS — J301 Allergic rhinitis due to pollen: Secondary | ICD-10-CM

## 2023-11-15 MED ORDER — TRIAMCINOLONE ACETONIDE 40 MG/ML IJ SUSP: 80.0000 mg | Freq: Once | INTRAMUSCULAR | Status: DC

## 2023-11-15 MED ORDER — FLUTICASONE PROPIONATE 50 MCG/ACT NA SUSP: 2.0000 | Freq: Every day | NASAL | 3 refills | Status: DC

## 2023-11-15 NOTE — Assessment & Plan Note (Signed)
 Continue with Flonase  Sent Loratadine Kenalog  80 mg IM # 1 given at the office.

## 2023-11-15 NOTE — Progress Notes (Signed)
 Subjective:  Patient ID: Brenda Mcmahon, female    DOB: 04-14-1991  Age: 33 y.o. MRN: [id number]  Chief Complaint  Patient presents with   Medical Management of Chronic Issues    HPI: Depression: Taking Pristiq  100mg  daily. Ambien  5mg  before bed as needed insomnia.  New counselor. Eating one meal a day. Eats fruits and vegetables. Not exercising.  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 Her mom is doing better. They have been vacationing and is doing well.  Boyfriend lives in [place] . Dating since November 2024.   Palpitations: has not required acebutolol .   Patient is requesting kenalog  injection for allergies. On claritin and flonase .   Menstrual cycle: 3-5 days monthly. Her current period has been going on 1 week.      11/15/2023   10:33 AM 05/14/2023   10:25 AM 03/13/2023    8:00 AM 12/12/2022    3:47 PM 11/07/2022    4:05 PM  Depression screen PHQ 2/9  Decreased Interest 1 1 3 1 2   Down, Depressed, Hopeless 1 1 3 2 2   PHQ - 2 Score 2 2 6 3 4   Altered sleeping 1 1 3 2 2   Tired, decreased energy 1 1 3 1 2   Change in appetite 1 1 3 2 2   Feeling bad or failure about yourself  1 1 3 2 2   Trouble concentrating 1 1 3 1 2   Moving slowly or fidgety/restless 1 1 3 1 2   Suicidal thoughts 0 1 3 1 2   PHQ-9 Score 8 9 27 13 18   Difficult doing work/chores Somewhat difficult Somewhat difficult Extremely dIfficult Somewhat difficult Somewhat difficult        03/13/2023    8:00 AM  Fall Risk   Falls in the past year? 0  Number falls in past yr: 0  Injury with Fall? 0  Risk for fall due to : No Fall Risks  Follow up Falls evaluation completed    Patient Care Team: Sherre Clapper, MD as PCP - General (Family Medicine)   Review of Systems  Constitutional:  Negative for chills, fatigue and fever.  HENT:  Positive for congestion. Negative for ear pain, rhinorrhea and sore throat.   Respiratory:  Negative for cough and shortness of breath.   Cardiovascular:   Negative for chest pain.  Gastrointestinal:  Negative for abdominal pain, constipation, diarrhea, nausea and vomiting.  Genitourinary:  Negative for dysuria and urgency.  Musculoskeletal:  Negative for back pain and myalgias.  Neurological:  Negative for dizziness, weakness, light-headedness and headaches.  Psychiatric/Behavioral:  Negative for dysphoric mood. The patient is not nervous/anxious.     Current Outpatient Medications on File Prior to Visit  Medication Sig Dispense Refill   acebutolol  (SECTRAL ) 200 MG capsule Take 1 capsule (200 mg total) by mouth daily as needed (for rapid heart rate). 90 capsule 3   desvenlafaxine  (PRISTIQ ) 100 MG 24 hr tablet Take 1 tablet (100 mg total) by mouth daily. 90 tablet 0   loratadine (CLARITIN) 10 MG tablet Take 10 mg by mouth daily.     zolpidem  (AMBIEN ) 5 MG tablet Take 1 tablet (5 mg total) by mouth at bedtime as needed for sleep. 30 tablet 2   No current facility-administered medications on file prior to visit.   Past Medical History:  Diagnosis Date   Achilles bursitis of right lower extremity 03/14/2017   Anxiety    Chronic right shoulder pain 03/16/2023   Depression 08/07/2021   Dysrhythmia    Encounter  for other contraceptive management 08/07/2021   GAD (generalized anxiety disorder) 08/07/2021   Heart murmur    Moderate recurrent major depression (HCC) 08/07/2021   Need for DTaP vaccine 09/18/2021   Other insomnia 10/14/2022   Palpitation    Post-traumatic stress disorder, chronic 02/22/2023   Recurrent major depression-severe (HCC) 03/14/2017   Routine medical exam 09/18/2021   Past Surgical History:  Procedure Laterality Date   FOOT FRACTURE SURGERY Right 2015   also ankle fracture repaired.    SHOULDER ARTHROSCOPY WITH ROTATOR CUFF REPAIR AND SUBACROMIAL DECOMPRESSION Right 03/28/2023   Procedure: SHOULDER ARTHROSCOPY  AND SUBACROMIAL DECOMPRESSION DEBRIDEMENT;  Surgeon: Duwayne Purchase, MD;  Location: [facility];  Service:  Orthopedics;  Laterality: Right;   WRIST FRACTURE SURGERY Left    33 yo.  fell through glass door.     Family History  Problem Relation Age of Onset   Seizures Mother    Hypotension Mother    Asthma Brother    Seizures Maternal Grandfather    Social History   Socioeconomic History   Marital status: Single    Spouse name: Not on file   Number of children: Not on file   Years of education: Not on file   Highest education level: Not on file  Occupational History   Occupation: Pharmacologist    Employer: [employer]  Tobacco Use   Smoking status: Never   Smokeless tobacco: Never  Vaping Use   Vaping status: Never Used  Substance and Sexual Activity   Alcohol use: No   Drug use: Never   Sexual activity: Not on file  Other Topics Concern   Not on file  Social History Narrative   Not on file   Social Drivers of Health   Financial Resource Strain: Low Risk  (09/13/2021)   Overall Financial Resource Strain (CARDIA)    Difficulty of Paying Living Expenses: Not very hard  Food Insecurity: No Food Insecurity (09/13/2021)   Hunger Vital Sign    Worried About Running Out of Food in the Last Year: Never true    Ran Out of Food in the Last Year: Never true  Transportation Needs: No Transportation Needs (09/13/2021)   PRAPARE - Administrator, Civil Service (Medical): No    Lack of Transportation (Non-Medical): No  Physical Activity: Inactive (10/10/2022)   Exercise Vital Sign    Days of Exercise per Week: 0 days    Minutes of Exercise per Session: 0 min  Stress: No Stress Concern Present (10/10/2022)   [employer] of Occupational Health - Occupational Stress Questionnaire    Feeling of Stress : Not at all  Social Connections: Moderately Isolated (10/10/2022)   Social Connection and Isolation Panel    Frequency of Communication with Friends and Family: Three times a week    Frequency of Social Gatherings with Friends and Family: Three times a week    Attends  Religious Services: More than 4 times per year    Active Member of Clubs or Organizations: No    Attends Banker Meetings: Never    Marital Status: Never married    Objective:  BP 118/70   Pulse 74   Temp 98.3 F (36.8 C)   Ht 5' 2 (1.575 m)   Wt 139 lb (63 kg)   SpO2 97%   BMI 25.42 kg/m      11/15/2023   10:30 AM 05/14/2023   10:00 AM 05/08/2023    4:46 PM  BP/Weight  Systolic BP  118 124 108  Diastolic BP 70 68 72  Wt. (Lbs) 139 120 123.2  BMI 25.42 kg/m2 21.95 kg/m2 22.53 kg/m2    Physical Exam Vitals reviewed.  Constitutional:      Appearance: Normal appearance.  HENT:     Right Ear: Tympanic membrane, ear canal and external ear normal.     Left Ear: Tympanic membrane, ear canal and external ear normal.     Nose: Congestion (pale edematous turbinates BL.) and rhinorrhea present.     Mouth/Throat:     Pharynx: Oropharynx is clear. No oropharyngeal exudate or posterior oropharyngeal erythema.  Cardiovascular:     Rate and Rhythm: Normal rate and regular rhythm.     Heart sounds: Normal heart sounds. No murmur heard. Pulmonary:     Effort: Pulmonary effort is normal. No respiratory distress.     Breath sounds: Normal breath sounds.  Neurological:     Mental Status: She is alert and oriented to person, place, and time.  Psychiatric:        Mood and Affect: Mood normal.        Behavior: Behavior normal.         Lab Results  Component Value Date   WBC 8.0 03/19/2023   HGB 12.4 03/19/2023   HCT 39.1 03/19/2023   PLT 223 03/19/2023   GLUCOSE 84 09/13/2021   CHOL 160 10/10/2022   TRIG 91 10/10/2022   HDL 63 10/10/2022   LDLCALC 80 10/10/2022   ALT 14 09/13/2021   AST 20 09/13/2021   NA 137 09/13/2021   K 4.5 09/13/2021   CL 102 09/13/2021   CREATININE 0.88 09/13/2021   BUN 7 09/13/2021   CO2 22 09/13/2021   TSH 1.530 10/10/2022      Assessment & Plan:  GAD (generalized anxiety disorder) Assessment & Plan: Currently managed by  psychiatrist with Pristiq  100mg  daily.  Patient reports some improvement with therapy and lifestyle modifications. -Continue current psychiatric management and therapy. -Continue Pristiq  100mg  daily.   Moderate recurrent major depression (HCC) Assessment & Plan: Controlled -Continue current psychiatric management and therapy. -Continue Pristiq  100mg  daily.   Seasonal allergic rhinitis due to pollen Assessment & Plan: Continue with Flonase  Sent Loratadine Kenalog  80 mg IM # 1 given at the office.  Orders: -     Triamcinolone  Acetonide -     Fluticasone  Propionate; Place 2 sprays into both nostrils daily.  Dispense: 48 g; Refill: 3  Palpitation Assessment & Plan: Acebutolol  as needed.       Meds ordered this encounter  Medications   triamcinolone  acetonide (KENALOG -40) injection 80 mg   fluticasone  (FLONASE ) 50 MCG/ACT nasal spray    Sig: Place 2 sprays into both nostrils daily.    Dispense:  48 g    Refill:  3    No orders of the defined types were placed in this encounter.    Follow-up: Return in about 6 months (around 05/17/2024) for chronic follow up.  I,Marla I Leal-Borjas,acting as a scribe for Abigail Free, MD.,have documented all relevant documentation on the behalf of Abigail Free, MD,as directed by  Abigail Free, MD while in the presence of Abigail Free, MD.   An After Visit Summary was printed and given to the patient.  I attest that I have reviewed this visit and agree with the plan scribed by my staff.   Abigail Free, MD [facility] [phone]

## 2023-11-15 NOTE — Assessment & Plan Note (Signed)
 Controlled -Continue current psychiatric management and therapy. -Continue Pristiq  100mg  daily.

## 2023-11-15 NOTE — Assessment & Plan Note (Signed)
 Currently managed by psychiatrist with Pristiq  100mg  daily.  Patient reports some improvement with therapy and lifestyle modifications. -Continue current psychiatric management and therapy. -Continue Pristiq  100mg  daily.

## 2023-11-18 ENCOUNTER — Other Ambulatory Visit: Payer: Self-pay | Admitting: Family Medicine

## 2023-11-18 DIAGNOSIS — J301 Allergic rhinitis due to pollen: Secondary | ICD-10-CM

## 2023-11-18 NOTE — Assessment & Plan Note (Signed)
 Acebutolol  as needed.

## 2023-12-11 ENCOUNTER — Other Ambulatory Visit: Payer: Self-pay

## 2023-12-11 MED ORDER — ONDANSETRON HCL 4 MG PO TABS: 4.0000 mg | ORAL_TABLET | Freq: Three times a day (TID) | ORAL | 0 refills | Status: DC | PRN

## 2023-12-11 NOTE — Telephone Encounter (Signed)
 Patient requesting rx for zofran  due to nausea while on her menstrual cycle, ok per Dr. Sherre.

## 2024-01-15 ENCOUNTER — Other Ambulatory Visit (HOSPITAL_COMMUNITY): Payer: Self-pay

## 2024-01-15 MED ORDER — DESVENLAFAXINE SUCCINATE ER 100 MG PO TB24
100.0000 mg | ORAL_TABLET | Freq: Every day | ORAL | 0 refills | Status: DC
  Filled 2024-01-15: qty 90, 90d supply, fill #0

## 2024-01-17 ENCOUNTER — Other Ambulatory Visit: Payer: Self-pay | Admitting: Family Medicine

## 2024-04-14 ENCOUNTER — Other Ambulatory Visit (HOSPITAL_COMMUNITY): Payer: Self-pay

## 2024-04-14 MED ORDER — DESVENLAFAXINE SUCCINATE ER 100 MG PO TB24
100.0000 mg | ORAL_TABLET | Freq: Every day | ORAL | 0 refills | Status: DC
  Filled 2024-04-14: qty 90, 90d supply, fill #0

## 2024-05-19 ENCOUNTER — Encounter: Payer: Self-pay | Admitting: Family Medicine

## 2024-05-19 ENCOUNTER — Telehealth: Payer: PRIVATE HEALTH INSURANCE | Admitting: Family Medicine

## 2024-05-19 DIAGNOSIS — F330 Major depressive disorder, recurrent, mild: Secondary | ICD-10-CM

## 2024-05-19 DIAGNOSIS — R42 Dizziness and giddiness: Secondary | ICD-10-CM | POA: Insufficient documentation

## 2024-05-19 DIAGNOSIS — R519 Headache, unspecified: Secondary | ICD-10-CM | POA: Insufficient documentation

## 2024-05-19 DIAGNOSIS — I471 Supraventricular tachycardia, unspecified: Secondary | ICD-10-CM

## 2024-05-19 NOTE — Assessment & Plan Note (Signed)
 Headache persisting for 2-3 weeks, rated 8/10, with dizziness, nausea, and photophobia. History of childhood migraines. Differential includes migraine, brain mass, and other neurological causes. Previous vision loss episode last summer requires further investigation. - Ordered CT scan of the brain. - Advised ibuprofen  or acetaminophen  for headache relief. - Encouraged hydration. - Recommended acebutolol  as needed for chest symptoms and potential migraine relief. - Suggested zolpidem  for improved sleep. - Instructed follow-up if headache persists. Orders:   CT HEAD WO CONTRAST ( ); Future

## 2024-05-19 NOTE — Assessment & Plan Note (Signed)
 Desvenlafaxine  100 mg daily. - Continue desvenlafaxine  100 mg daily.

## 2024-05-19 NOTE — Assessment & Plan Note (Signed)
 Intermittent chest tightness without recent medication use. - Recommended acebutolol  as needed for chest symptoms.

## 2024-05-19 NOTE — Assessment & Plan Note (Signed)
 Order ct head Orders:   CT HEAD WO CONTRAST ( ); Future

## 2024-05-22 ENCOUNTER — Other Ambulatory Visit: Payer: Self-pay | Admitting: Family Medicine

## 2024-05-22 DIAGNOSIS — R519 Headache, unspecified: Secondary | ICD-10-CM

## 2024-05-22 DIAGNOSIS — R42 Dizziness and giddiness: Secondary | ICD-10-CM

## 2024-05-27 ENCOUNTER — Other Ambulatory Visit (HOSPITAL_BASED_OUTPATIENT_CLINIC_OR_DEPARTMENT_OTHER): Payer: Self-pay | Admitting: Radiology
